# Patient Record
Sex: Female | Born: 1950 | Race: White | Hispanic: No | Marital: Single | Smoking: Never smoker
Health system: Southern US, Community
[De-identification: ages and names within clinical notes are randomized; demographics above are authoritative.]

## PROBLEM LIST (undated history)

## (undated) DIAGNOSIS — N183 Chronic kidney disease, stage 3 unspecified: Secondary | ICD-10-CM

## (undated) DIAGNOSIS — I10 Essential (primary) hypertension: Secondary | ICD-10-CM

## (undated) DIAGNOSIS — F319 Bipolar disorder, unspecified: Secondary | ICD-10-CM

## (undated) DIAGNOSIS — M1990 Unspecified osteoarthritis, unspecified site: Secondary | ICD-10-CM

## (undated) DIAGNOSIS — R931 Abnormal findings on diagnostic imaging of heart and coronary circulation: Secondary | ICD-10-CM

## (undated) DIAGNOSIS — E785 Hyperlipidemia, unspecified: Secondary | ICD-10-CM

## (undated) DIAGNOSIS — E860 Dehydration: Secondary | ICD-10-CM

## (undated) DIAGNOSIS — H269 Unspecified cataract: Secondary | ICD-10-CM

## (undated) DIAGNOSIS — K219 Gastro-esophageal reflux disease without esophagitis: Secondary | ICD-10-CM

## (undated) DIAGNOSIS — I700 Atherosclerosis of aorta: Secondary | ICD-10-CM

## (undated) DIAGNOSIS — G4700 Insomnia, unspecified: Secondary | ICD-10-CM

## (undated) DIAGNOSIS — F329 Major depressive disorder, single episode, unspecified: Secondary | ICD-10-CM

## (undated) DIAGNOSIS — F99 Mental disorder, not otherwise specified: Secondary | ICD-10-CM

## (undated) DIAGNOSIS — F419 Anxiety disorder, unspecified: Secondary | ICD-10-CM

## (undated) DIAGNOSIS — F32A Depression, unspecified: Secondary | ICD-10-CM

## (undated) DIAGNOSIS — T56891A Toxic effect of other metals, accidental (unintentional), initial encounter: Secondary | ICD-10-CM

## (undated) DIAGNOSIS — R197 Diarrhea, unspecified: Secondary | ICD-10-CM

## (undated) DIAGNOSIS — D649 Anemia, unspecified: Secondary | ICD-10-CM

## (undated) DIAGNOSIS — T7840XA Allergy, unspecified, initial encounter: Secondary | ICD-10-CM

## (undated) HISTORY — PX: TOTAL ABDOMINAL HYSTERECTOMY: SHX209

## (undated) HISTORY — DX: Mental disorder, not otherwise specified: F99

## (undated) HISTORY — DX: Unspecified osteoarthritis, unspecified site: M19.90

## (undated) HISTORY — DX: Essential (primary) hypertension: I10

## (undated) HISTORY — DX: Depression, unspecified: F32.A

## (undated) HISTORY — DX: Hyperlipidemia, unspecified: E78.5

## (undated) HISTORY — DX: Anemia, unspecified: D64.9

## (undated) HISTORY — DX: Atherosclerosis of aorta: I70.0

## (undated) HISTORY — DX: Hypercalcemia: E83.52

## (undated) HISTORY — DX: Bipolar disorder, unspecified: F31.9

## (undated) HISTORY — PX: CATARACT EXTRACTION, BILATERAL: SHX1313

## (undated) HISTORY — DX: Major depressive disorder, single episode, unspecified: F32.9

## (undated) HISTORY — DX: Chronic kidney disease, stage 3 unspecified: N18.30

## (undated) HISTORY — PX: CHOLECYSTECTOMY: SHX55

## (undated) HISTORY — DX: Anxiety disorder, unspecified: F41.9

## (undated) HISTORY — DX: Allergy, unspecified, initial encounter: T78.40XA

## (undated) HISTORY — DX: Unspecified cataract: H26.9

## (undated) HISTORY — PX: TONSILLECTOMY: SUR1361

## (undated) HISTORY — DX: Abnormal findings on diagnostic imaging of heart and coronary circulation: R93.1

## (undated) HISTORY — DX: Diarrhea, unspecified: R19.7

## (undated) HISTORY — DX: Gastro-esophageal reflux disease without esophagitis: K21.9

## (undated) HISTORY — DX: Dehydration: E86.0

## (undated) HISTORY — DX: Toxic effect of other metals, accidental (unintentional), initial encounter: T56.891A

## (undated) HISTORY — DX: Chronic kidney disease, stage 3 (moderate): N18.3

## (undated) SURGERY — EGD (ESOPHAGOGASTRODUODENOSCOPY)
Anesthesia: Moderate Sedation

---

## 2000-08-01 ENCOUNTER — Encounter: Admission: RE | Admit: 2000-08-01 | Discharge: 2000-08-01 | Payer: Self-pay | Admitting: Obstetrics and Gynecology

## 2000-08-01 ENCOUNTER — Encounter: Payer: Self-pay | Admitting: Obstetrics and Gynecology

## 2001-04-10 ENCOUNTER — Other Ambulatory Visit: Admission: RE | Admit: 2001-04-10 | Discharge: 2001-04-10 | Payer: Self-pay | Admitting: Obstetrics and Gynecology

## 2001-04-25 ENCOUNTER — Ambulatory Visit (HOSPITAL_COMMUNITY): Admission: RE | Admit: 2001-04-25 | Discharge: 2001-04-25 | Payer: Self-pay | Admitting: Pulmonary Disease

## 2001-08-07 ENCOUNTER — Encounter: Admission: RE | Admit: 2001-08-07 | Discharge: 2001-08-07 | Payer: Self-pay | Admitting: Obstetrics and Gynecology

## 2001-08-07 ENCOUNTER — Encounter: Payer: Self-pay | Admitting: Obstetrics and Gynecology

## 2001-08-14 ENCOUNTER — Encounter: Payer: Self-pay | Admitting: Obstetrics and Gynecology

## 2001-08-14 ENCOUNTER — Encounter: Admission: RE | Admit: 2001-08-14 | Discharge: 2001-08-14 | Payer: Self-pay | Admitting: Obstetrics and Gynecology

## 2002-04-27 ENCOUNTER — Emergency Department (HOSPITAL_COMMUNITY): Admission: EM | Admit: 2002-04-27 | Discharge: 2002-04-27 | Payer: Self-pay | Admitting: Emergency Medicine

## 2002-05-08 ENCOUNTER — Ambulatory Visit (HOSPITAL_COMMUNITY): Admission: RE | Admit: 2002-05-08 | Discharge: 2002-05-08 | Payer: Self-pay | Admitting: Pulmonary Disease

## 2002-05-13 ENCOUNTER — Ambulatory Visit (HOSPITAL_COMMUNITY): Admission: RE | Admit: 2002-05-13 | Discharge: 2002-05-13 | Payer: Self-pay | Admitting: Pulmonary Disease

## 2002-06-04 ENCOUNTER — Encounter: Payer: Self-pay | Admitting: General Surgery

## 2002-06-12 ENCOUNTER — Observation Stay (HOSPITAL_COMMUNITY): Admission: RE | Admit: 2002-06-12 | Discharge: 2002-06-13 | Payer: Self-pay | Admitting: General Surgery

## 2002-06-12 ENCOUNTER — Encounter (INDEPENDENT_AMBULATORY_CARE_PROVIDER_SITE_OTHER): Payer: Self-pay | Admitting: Specialist

## 2002-10-14 ENCOUNTER — Encounter: Payer: Self-pay | Admitting: Obstetrics and Gynecology

## 2002-10-14 ENCOUNTER — Encounter: Admission: RE | Admit: 2002-10-14 | Discharge: 2002-10-14 | Payer: Self-pay | Admitting: Obstetrics and Gynecology

## 2002-12-22 ENCOUNTER — Other Ambulatory Visit: Admission: RE | Admit: 2002-12-22 | Discharge: 2002-12-22 | Payer: Self-pay | Admitting: Obstetrics and Gynecology

## 2003-10-30 ENCOUNTER — Encounter: Admission: RE | Admit: 2003-10-30 | Discharge: 2003-10-30 | Payer: Self-pay | Admitting: Obstetrics and Gynecology

## 2003-11-06 ENCOUNTER — Ambulatory Visit (HOSPITAL_COMMUNITY): Admission: RE | Admit: 2003-11-06 | Discharge: 2003-11-06 | Payer: Self-pay | Admitting: Pulmonary Disease

## 2004-11-14 ENCOUNTER — Encounter: Admission: RE | Admit: 2004-11-14 | Discharge: 2004-11-14 | Payer: Self-pay | Admitting: Obstetrics and Gynecology

## 2005-06-24 ENCOUNTER — Ambulatory Visit (HOSPITAL_COMMUNITY): Admission: RE | Admit: 2005-06-24 | Discharge: 2005-06-24 | Payer: Self-pay | Admitting: Pulmonary Disease

## 2005-11-16 ENCOUNTER — Encounter: Admission: RE | Admit: 2005-11-16 | Discharge: 2005-11-16 | Payer: Self-pay | Admitting: Obstetrics and Gynecology

## 2006-02-09 ENCOUNTER — Ambulatory Visit (HOSPITAL_COMMUNITY): Admission: RE | Admit: 2006-02-09 | Discharge: 2006-02-09 | Payer: Self-pay | Admitting: Pulmonary Disease

## 2006-11-29 ENCOUNTER — Encounter: Admission: RE | Admit: 2006-11-29 | Discharge: 2006-11-29 | Payer: Self-pay | Admitting: Obstetrics and Gynecology

## 2007-12-04 ENCOUNTER — Encounter: Admission: RE | Admit: 2007-12-04 | Discharge: 2007-12-04 | Payer: Self-pay | Admitting: Obstetrics and Gynecology

## 2008-03-18 ENCOUNTER — Ambulatory Visit (HOSPITAL_COMMUNITY): Admission: RE | Admit: 2008-03-18 | Discharge: 2008-03-18 | Payer: Self-pay | Admitting: Pulmonary Disease

## 2008-12-07 ENCOUNTER — Encounter: Admission: RE | Admit: 2008-12-07 | Discharge: 2008-12-07 | Payer: Self-pay | Admitting: Obstetrics and Gynecology

## 2009-12-08 ENCOUNTER — Encounter: Admission: RE | Admit: 2009-12-08 | Discharge: 2009-12-08 | Payer: Self-pay | Admitting: Obstetrics and Gynecology

## 2010-12-12 ENCOUNTER — Ambulatory Visit (HOSPITAL_COMMUNITY)
Admission: RE | Admit: 2010-12-12 | Discharge: 2010-12-12 | Payer: Self-pay | Source: Home / Self Care | Attending: Obstetrics and Gynecology | Admitting: Obstetrics and Gynecology

## 2011-04-21 NOTE — Op Note (Signed)
[facility]  Patient:    Samantha Conley, Samantha Conley Visit Number: [id number] MRN: [id number]          Service Type: SUR Location: 3W [id number] Attending Physician:  Luella Cook Dictated by:   Sammuel Hines. Daiva Nakayama, M.D. Proc. Date: 06/12/02 Admit Date:  06/12/2002 Discharge Date: 06/13/2002                             Operative Report  PREOPERATIVE DIAGNOSIS:  Biliary dyskinesia.  POSTOPERATIVE DIAGNOSIS:  Biliary dyskinesia.  PROCEDURE:  Laparoscopic cholecystectomy.  SURGEON:  Sammuel Hines. Daiva Nakayama, M.D.  ASSISTANT:  Haywood Lasso, M.D.  ANESTHESIA:  General endotracheal.  PROCEDURE:  After informed consent was obtained, the patient was brought to the operating room, placed in a supine position on the operating table. After adequate induction of general anesthesia, the patients abdomen was prepped with Betadine and draped in usual sterile manner. The area below the umbilicus was infiltrated with 0.25% Marcaine and a small vertically oriented incision was made with a 15 blade knife. This incision was carried down through the subcutaneous tissue using blunt dissection with Army-Nav[employer] retractors and a Kelly clamp until the linea alba was identified. The linea alba was incised with a 15 blade knife and each side was grasped with Kocher clamps and elevated anteriorly. The preperitoneal space was then prepped only with a hemostat until the peritoneum was opened and access was obtained of the abdominal cavity. A finger was inserted through this opening and there were no apparent adhesions to the anterior abdominal wall. A 0 Vicryl pursestring stitch was placed in the fascia surrounding this opening. A Hasson cannula was then placed through this opening and anchored in place with the previous placed Vicryl pursestring stitch. The abdomen was then insufflated with carbon dioxide without difficulty. The patient was placed in the head-up position and a laparoscope  was placed through the Hasson cannula and the dome of the gallbladder and liver edge were readily identified. A small transverse upper midline incision was then made after infiltrating this area with 0.25% Marcaine. A 10-mm port was then placed bluntly through this incision into the abdominal cavity under direct vision. Sites were then chosen laterally on the right side of the abdomen for placement of 5-mm ports and each of these areas was infiltrated with 0.25% Marcaine. Small stab incisions were made with a 15 blade knife and 5-mm ports were placed bluntly through these incisions into the abdominal cavity under direct vision. A blunt grasper was placed through the lateral most 5-mm port and used to grasp the dome of the gallbladder and elevate it anteriorly and superiorly. Another blunt grasper was placed through the other 5-mm port and used to retract on the body and neck of the gallbladder. A dissector was placed through the upper midline port and using the electrocautery, the peritoneal reflection of the gallbladder neck was opened. The blunt dissection was then carried out in this area until the gallbladder neck cystic duct junction was readily identified. Dissection was carried out circumferentially until a nice window was created and care was taken to keep the common duct medial to this dissection. Three clips were then placed proximally and one distally on the cystic duct and the duct was divided between the two. Posterior to this, the cystic artery was identified and again, dissected bluntly in a circumferential manner creating a nice window. Two clips were placed proximally in the artery  and one distally and the artery was divided between the two. Next, a laparoscopic hook cautery device was used to separate the gallbladder from the liver bed. Prior to completely detaching the gallbladder from the liver bed, the liver bed was inspected and several small bleeding points were  coagulated with electrocautery until the liver bed was completely hemostatic. The gallbladder was then detached the rest of the way from the liver bed using the hook electrocautery. The camera was then moved to the upper midline port and a gallbladder grasper was placed through the Hasson cannula and used to grasp the neck of the gallbladder. The gallbladder was then removed with the Hasson cannula through the infraumbilical port and the fascial defect was closed with the previous placed Vicryl pursestring stitch. The abdomen was then irrigated with copious amounts of saline until the effluent was clear and the liver bed was inspected again and found to be hemostatic. The ports were then all removed under direct vision and were hemostatic and the gas was allowed to escape. The skin incision was then closed with interrupted 4-0 Monocryl subcuticular stitches. Benzoin and Steri-Strips were applied. The patient tolerated the procedure well. At the end of the case, all sponge, instrument, and needle counts were correct. The patient was awakened and taken to the recovery room in stable condition. Dictated by:   Sammuel Hines. Daiva Nakayama, M.D. Attending Physician:  Luella Cook DD:  06/25/02 TD:  06/30/02 Job: [id number]

## 2011-11-07 ENCOUNTER — Other Ambulatory Visit: Payer: Self-pay | Admitting: Obstetrics and Gynecology

## 2011-11-07 DIAGNOSIS — Z1231 Encounter for screening mammogram for malignant neoplasm of breast: Secondary | ICD-10-CM

## 2011-12-01 ENCOUNTER — Encounter (INDEPENDENT_AMBULATORY_CARE_PROVIDER_SITE_OTHER): Payer: Self-pay | Admitting: *Deleted

## 2011-12-14 ENCOUNTER — Encounter (INDEPENDENT_AMBULATORY_CARE_PROVIDER_SITE_OTHER): Payer: Self-pay | Admitting: Internal Medicine

## 2011-12-14 ENCOUNTER — Ambulatory Visit (INDEPENDENT_AMBULATORY_CARE_PROVIDER_SITE_OTHER): Payer: 59 | Admitting: Internal Medicine

## 2011-12-14 DIAGNOSIS — F99 Mental disorder, not otherwise specified: Secondary | ICD-10-CM | POA: Insufficient documentation

## 2011-12-14 DIAGNOSIS — I10 Essential (primary) hypertension: Secondary | ICD-10-CM | POA: Insufficient documentation

## 2011-12-14 DIAGNOSIS — J40 Bronchitis, not specified as acute or chronic: Secondary | ICD-10-CM | POA: Insufficient documentation

## 2011-12-14 DIAGNOSIS — K5900 Constipation, unspecified: Secondary | ICD-10-CM

## 2011-12-14 DIAGNOSIS — M109 Gout, unspecified: Secondary | ICD-10-CM | POA: Insufficient documentation

## 2011-12-14 DIAGNOSIS — F319 Bipolar disorder, unspecified: Secondary | ICD-10-CM

## 2011-12-14 HISTORY — DX: Mental disorder, not otherwise specified: F99

## 2011-12-14 NOTE — Patient Instructions (Addendum)
Amitiza  Daily.Samantha Conley schedule a colonoscopy .

## 2011-12-14 NOTE — Progress Notes (Signed)
Subjective:     Patient ID: Samantha Conley, female   DOB: 06-Mar-1951, 61 y.o.   MRN: [id number]  HPI  Samantha Conley is a 61 yr old female referred to our office by Dr. Luan Pulling because of constipation. She says she has been having problems with constipation since 2011.  She was taking a stool softner but it caused her to have frequent stools.  In December she had to give herself 3 enemas due to constipation.  Since making the appt x 2 weeks with our office, she has BM every day.  She tells me she has lost about 10 since August.  Appetite is good. No melena or bright red rectal bleeding.  She has never undergone a colonoscopy in the past.    Review of Systems See hpi  Current Outpatient Prescriptions  Medication Sig Dispense Refill  . allopurinol (ZYLOPRIM) 300 MG tablet Take 300 mg by mouth daily.      Marland Kitchen aspirin 81 MG tablet Take 160 mg by mouth daily.      . diphenhydrAMINE (SOMINEX) 25 MG tablet Take 25 mg by mouth at bedtime as needed.      . fexofenadine (ALLEGRA) 180 MG tablet Take 180 mg by mouth daily.      Marland Kitchen lithium carbonate (ESKALITH) 450 MG CR tablet Take 450 mg by mouth at bedtime.      Marland Kitchen thiothixene (NAVANE) 2 MG capsule Take 2 mg by mouth as needed.       History   Social History  . Marital Status: Single    Spouse Name: N/A    Number of Children: N/A  . Years of Education: N/A   Occupational History  . Not on file.   Social History Main Topics  . Smoking status: Never Smoker   . Smokeless tobacco: Not on file  . Alcohol Use: No  . Drug Use: No  . Sexually Active: Not on file   Other Topics Concern  . Not on file   Social History Narrative  . No narrative on file   Past Medical History  Diagnosis Date  . Hypertension     since 2007  . Gout    Past Surgical History  Procedure Date  . Chemical imbalance   . Cholecystectomy   . Total abdominal hysterectomy   . Tonsillectomy    History reviewed. No pertinent family history. Family Status  Relation Status  Death Age  . Mother Deceased     CVA  . Father Deceased     parkinson  . Brother Alive     unknown   Allergies  Allergen Reactions  . Codeine   . Penicillins        Objective:   Physical Exam  Filed Vitals:   12/14/11 1515  Height: 5\' 5"  (1.651 m)  Weight: 176 lb (79.833 kg)    Alert and oriented. Skin warm and dry. Oral mucosa is moist.   . Sclera anicteric, conjunctivae is pink. Thyroid not enlarged. No cervical lymphadenopathy. Lungs clear. Heart regular rate and rhythm.  Abdomen is soft. Bowel sounds are positive. No hepatomegaly. No abdominal masses felt. No tenderness.  No edema to lower extremities. Patient is alert and oriented.     Assessment:    Constipation. In need of screening colonoscopy    Plan:    Colonoscopy will be scheduled.   The risks and benefits such as perforation, bleeding, and infection were reviewed with the patient and is agreeable. Amitiza samples given to patient.

## 2011-12-25 ENCOUNTER — Ambulatory Visit
Admission: RE | Admit: 2011-12-25 | Discharge: 2011-12-25 | Disposition: A | Discharge status: Home / Self Care | Payer: 59 | Source: Ambulatory Visit | Attending: Obstetrics and Gynecology | Admitting: Obstetrics and Gynecology

## 2011-12-25 DIAGNOSIS — Z1231 Encounter for screening mammogram for malignant neoplasm of breast: Secondary | ICD-10-CM

## 2012-05-27 ENCOUNTER — Other Ambulatory Visit: Payer: Self-pay | Admitting: Gastroenterology

## 2012-12-02 ENCOUNTER — Other Ambulatory Visit (HOSPITAL_COMMUNITY): Payer: Self-pay | Admitting: Pulmonary Disease

## 2012-12-02 DIAGNOSIS — Z1231 Encounter for screening mammogram for malignant neoplasm of breast: Secondary | ICD-10-CM

## 2012-12-04 HISTORY — PX: NASAL SINUS SURGERY: SHX719

## 2012-12-25 ENCOUNTER — Ambulatory Visit (HOSPITAL_COMMUNITY)
Admission: RE | Admit: 2012-12-25 | Discharge: 2012-12-25 | Disposition: A | Discharge status: Home / Self Care | Payer: Self-pay | Source: Ambulatory Visit | Attending: Pulmonary Disease | Admitting: Pulmonary Disease

## 2012-12-25 DIAGNOSIS — Z1231 Encounter for screening mammogram for malignant neoplasm of breast: Secondary | ICD-10-CM

## 2013-11-26 ENCOUNTER — Other Ambulatory Visit (HOSPITAL_COMMUNITY): Payer: Self-pay | Admitting: Obstetrics and Gynecology

## 2013-11-26 DIAGNOSIS — Z1231 Encounter for screening mammogram for malignant neoplasm of breast: Secondary | ICD-10-CM

## 2013-12-22 ENCOUNTER — Other Ambulatory Visit (HOSPITAL_COMMUNITY): Payer: Self-pay | Admitting: Pulmonary Disease

## 2013-12-22 ENCOUNTER — Ambulatory Visit (HOSPITAL_COMMUNITY)
Admission: RE | Admit: 2013-12-22 | Discharge: 2013-12-22 | Disposition: A | Discharge status: Home / Self Care | Payer: 59 | Source: Ambulatory Visit | Attending: Pulmonary Disease | Admitting: Pulmonary Disease

## 2013-12-22 DIAGNOSIS — R071 Chest pain on breathing: Secondary | ICD-10-CM | POA: Insufficient documentation

## 2013-12-22 DIAGNOSIS — R05 Cough: Secondary | ICD-10-CM | POA: Insufficient documentation

## 2013-12-22 DIAGNOSIS — R0989 Other specified symptoms and signs involving the circulatory and respiratory systems: Secondary | ICD-10-CM | POA: Insufficient documentation

## 2013-12-22 DIAGNOSIS — R0781 Pleurodynia: Secondary | ICD-10-CM

## 2013-12-22 DIAGNOSIS — R059 Cough, unspecified: Secondary | ICD-10-CM | POA: Insufficient documentation

## 2013-12-26 ENCOUNTER — Ambulatory Visit (HOSPITAL_COMMUNITY)
Admission: RE | Admit: 2013-12-26 | Discharge: 2013-12-26 | Disposition: A | Discharge status: Home / Self Care | Payer: 59 | Source: Ambulatory Visit | Attending: Obstetrics and Gynecology | Admitting: Obstetrics and Gynecology

## 2013-12-26 DIAGNOSIS — Z1231 Encounter for screening mammogram for malignant neoplasm of breast: Secondary | ICD-10-CM

## 2014-03-12 ENCOUNTER — Inpatient Hospital Stay (HOSPITAL_COMMUNITY): Payer: 59

## 2014-03-12 ENCOUNTER — Encounter (HOSPITAL_COMMUNITY): Payer: Self-pay | Admitting: Emergency Medicine

## 2014-03-12 ENCOUNTER — Inpatient Hospital Stay (HOSPITAL_COMMUNITY)
Admission: EM | Admit: 2014-03-12 | Discharge: 2014-03-15 | DRG: 683 | Disposition: A | Discharge status: Home / Self Care | Payer: 59 | Attending: Pulmonary Disease | Admitting: Pulmonary Disease

## 2014-03-12 DIAGNOSIS — Z8249 Family history of ischemic heart disease and other diseases of the circulatory system: Secondary | ICD-10-CM

## 2014-03-12 DIAGNOSIS — J45909 Unspecified asthma, uncomplicated: Secondary | ICD-10-CM | POA: Diagnosis present

## 2014-03-12 DIAGNOSIS — Z823 Family history of stroke: Secondary | ICD-10-CM

## 2014-03-12 DIAGNOSIS — E872 Acidosis, unspecified: Secondary | ICD-10-CM

## 2014-03-12 DIAGNOSIS — R112 Nausea with vomiting, unspecified: Secondary | ICD-10-CM

## 2014-03-12 DIAGNOSIS — E860 Dehydration: Secondary | ICD-10-CM | POA: Diagnosis present

## 2014-03-12 DIAGNOSIS — R197 Diarrhea, unspecified: Secondary | ICD-10-CM

## 2014-03-12 DIAGNOSIS — D649 Anemia, unspecified: Secondary | ICD-10-CM | POA: Diagnosis present

## 2014-03-12 DIAGNOSIS — A088 Other specified intestinal infections: Secondary | ICD-10-CM | POA: Diagnosis present

## 2014-03-12 DIAGNOSIS — M109 Gout, unspecified: Secondary | ICD-10-CM | POA: Diagnosis present

## 2014-03-12 DIAGNOSIS — F319 Bipolar disorder, unspecified: Secondary | ICD-10-CM | POA: Diagnosis present

## 2014-03-12 DIAGNOSIS — I10 Essential (primary) hypertension: Secondary | ICD-10-CM | POA: Diagnosis present

## 2014-03-12 DIAGNOSIS — N179 Acute kidney failure, unspecified: Principal | ICD-10-CM

## 2014-03-12 HISTORY — DX: Dehydration: E86.0

## 2014-03-12 HISTORY — DX: Insomnia, unspecified: G47.00

## 2014-03-12 HISTORY — DX: Bipolar disorder, unspecified: F31.9

## 2014-03-12 HISTORY — DX: Hypercalcemia: E83.52

## 2014-03-12 HISTORY — DX: Diarrhea, unspecified: R19.7

## 2014-03-12 LAB — URINALYSIS, ROUTINE W REFLEX MICROSCOPIC
BILIRUBIN URINE: NEGATIVE
Glucose, UA: NEGATIVE mg/dL
HGB URINE DIPSTICK: NEGATIVE
Ketones, ur: NEGATIVE mg/dL
Leukocytes, UA: NEGATIVE
Nitrite: NEGATIVE
Protein, ur: NEGATIVE mg/dL
Specific Gravity, Urine: 1.02 (ref 1.005–1.030)
UROBILINOGEN UA: 0.2 mg/dL (ref 0.0–1.0)
pH: 5.5 (ref 5.0–8.0)

## 2014-03-12 LAB — CBC WITH DIFFERENTIAL/PLATELET
BASOS PCT: 1 % (ref 0–1)
Basophils Absolute: 0.1 10*3/uL (ref 0.0–0.1)
EOS ABS: 0.1 10*3/uL (ref 0.0–0.7)
Eosinophils Relative: 1 % (ref 0–5)
HCT: 32.4 % — ABNORMAL LOW (ref 36.0–46.0)
Hemoglobin: 10.5 g/dL — ABNORMAL LOW (ref 12.0–15.0)
LYMPHS ABS: 1.5 10*3/uL (ref 0.7–4.0)
Lymphocytes Relative: 15 % (ref 12–46)
MCH: 29.6 pg (ref 26.0–34.0)
MCHC: 32.4 g/dL (ref 30.0–36.0)
MCV: 91.3 fL (ref 78.0–100.0)
Monocytes Absolute: 0.4 10*3/uL (ref 0.1–1.0)
Monocytes Relative: 4 % (ref 3–12)
Neutro Abs: 8.3 10*3/uL — ABNORMAL HIGH (ref 1.7–7.7)
Neutrophils Relative %: 80 % — ABNORMAL HIGH (ref 43–77)
PLATELETS: 296 10*3/uL (ref 150–400)
RBC: 3.55 MIL/uL — AB (ref 3.87–5.11)
RDW: 13.8 % (ref 11.5–15.5)
WBC: 10.4 10*3/uL (ref 4.0–10.5)

## 2014-03-12 LAB — HEPATIC FUNCTION PANEL
ALK PHOS: 114 U/L (ref 39–117)
ALT: 11 U/L (ref 0–35)
AST: 14 U/L (ref 0–37)
Albumin: 4.2 g/dL (ref 3.5–5.2)
BILIRUBIN TOTAL: 0.8 mg/dL (ref 0.3–1.2)
Total Protein: 7.4 g/dL (ref 6.0–8.3)

## 2014-03-12 LAB — BASIC METABOLIC PANEL
BUN: 61 mg/dL — ABNORMAL HIGH (ref 6–23)
CO2: 15 mEq/L — ABNORMAL LOW (ref 19–32)
Calcium: 11.6 mg/dL — ABNORMAL HIGH (ref 8.4–10.5)
Chloride: 107 mEq/L (ref 96–112)
Creatinine, Ser: 4.49 mg/dL — ABNORMAL HIGH (ref 0.50–1.10)
GFR calc Af Amer: 11 mL/min — ABNORMAL LOW (ref >90)
GFR calc non Af Amer: 10 mL/min — ABNORMAL LOW (ref >90)
GLUCOSE: 140 mg/dL — AB (ref 70–99)
Potassium: 5 mEq/L (ref 3.7–5.3)
SODIUM: 134 meq/L — AB (ref 137–147)

## 2014-03-12 LAB — CREATININE, URINE, RANDOM: Creatinine, Urine: 162.1 mg/dL

## 2014-03-12 LAB — LITHIUM LEVEL: Lithium Lvl: 2.23 mEq/L (ref 0.80–1.40)

## 2014-03-12 LAB — SODIUM, URINE, RANDOM: Sodium, Ur: 38 mEq/L

## 2014-03-12 LAB — LIPASE, BLOOD: LIPASE: 477 U/L — AB (ref 11–59)

## 2014-03-12 LAB — LACTIC ACID, PLASMA: LACTIC ACID, VENOUS: 1.1 mmol/L (ref 0.5–2.2)

## 2014-03-12 MED ORDER — ALBUTEROL SULFATE HFA 108 (90 BASE) MCG/ACT IN AERS: 2.0000 | INHALATION_SPRAY | Freq: Two times a day (BID) | RESPIRATORY_TRACT | Status: DC

## 2014-03-12 MED ORDER — ALBUTEROL SULFATE (2.5 MG/3ML) 0.083% IN NEBU
2.5000 mg | INHALATION_SOLUTION | Freq: Two times a day (BID) | RESPIRATORY_TRACT | Status: DC
  Administered 2014-03-13: 2.5 mg via RESPIRATORY_TRACT
  Filled 2014-03-12 (×4): qty 3

## 2014-03-12 MED ORDER — HEPARIN SODIUM (PORCINE) 5000 UNIT/ML IJ SOLN
5000.0000 [IU] | Freq: Three times a day (TID) | INTRAMUSCULAR | Status: DC
  Administered 2014-03-12 – 2014-03-15 (×7): 5000 [IU] via SUBCUTANEOUS
  Filled 2014-03-12 (×7): qty 1

## 2014-03-12 MED ORDER — ONDANSETRON HCL 4 MG/2ML IJ SOLN
4.0000 mg | Freq: Four times a day (QID) | INTRAMUSCULAR | Status: DC | PRN
  Administered 2014-03-12 – 2014-03-13 (×2): 4 mg via INTRAVENOUS
  Filled 2014-03-12 (×2): qty 2

## 2014-03-12 MED ORDER — SODIUM CHLORIDE 0.9 % IV BOLUS (SEPSIS)
1000.0000 mL | Freq: Once | INTRAVENOUS | Status: AC
  Administered 2014-03-12: 1000 mL via INTRAVENOUS

## 2014-03-12 MED ORDER — DIPHENHYDRAMINE HCL (SLEEP) 25 MG PO TABS: 25.0000 mg | ORAL_TABLET | Freq: Every evening | ORAL | Status: DC | PRN

## 2014-03-12 MED ORDER — ALBUTEROL SULFATE (2.5 MG/3ML) 0.083% IN NEBU: 2.5000 mg | INHALATION_SOLUTION | Freq: Two times a day (BID) | RESPIRATORY_TRACT | Status: DC

## 2014-03-12 MED ORDER — ALLOPURINOL 300 MG PO TABS
300.0000 mg | ORAL_TABLET | Freq: Every day | ORAL | Status: DC
  Administered 2014-03-12: 300 mg via ORAL
  Filled 2014-03-12 (×2): qty 1

## 2014-03-12 MED ORDER — ONDANSETRON HCL 4 MG PO TABS: 4.0000 mg | ORAL_TABLET | Freq: Four times a day (QID) | ORAL | Status: DC | PRN

## 2014-03-12 MED ORDER — ACETAMINOPHEN 500 MG PO TABS
500.0000 mg | ORAL_TABLET | Freq: Four times a day (QID) | ORAL | Status: DC | PRN
  Administered 2014-03-12 – 2014-03-13 (×3): 500 mg via ORAL
  Filled 2014-03-12 (×3): qty 1

## 2014-03-12 MED ORDER — THIOTHIXENE 2 MG PO CAPS
2.0000 mg | ORAL_CAPSULE | Freq: Every day | ORAL | Status: DC
  Administered 2014-03-12: 2 mg via ORAL
  Filled 2014-03-12 (×5): qty 1

## 2014-03-12 MED ORDER — ONDANSETRON HCL 4 MG/2ML IJ SOLN: 4.0000 mg | Freq: Three times a day (TID) | INTRAMUSCULAR | Status: DC | PRN

## 2014-03-12 MED ORDER — DIPHENHYDRAMINE HCL 25 MG PO CAPS: 25.0000 mg | ORAL_CAPSULE | Freq: Every evening | ORAL | Status: DC | PRN

## 2014-03-12 MED ORDER — TEMAZEPAM 15 MG PO CAPS
30.0000 mg | ORAL_CAPSULE | Freq: Every evening | ORAL | Status: DC | PRN
Start: 2014-03-12 — End: 2014-03-15
  Administered 2014-03-12 – 2014-03-14 (×3): 30 mg via ORAL
  Filled 2014-03-12 (×4): qty 2

## 2014-03-12 MED ORDER — SODIUM CHLORIDE 0.9 % IV SOLN
INTRAVENOUS | Status: DC
  Administered 2014-03-12 – 2014-03-15 (×6): via INTRAVENOUS

## 2014-03-12 MED ORDER — ALBUTEROL SULFATE (2.5 MG/3ML) 0.083% IN NEBU
INHALATION_SOLUTION | RESPIRATORY_TRACT | Status: AC
  Filled 2014-03-12: qty 3

## 2014-03-12 NOTE — H&P (Addendum)
[facility] History and Physical  Samantha Conley [id number] DOB: [birth year] DOA: 03/12/2014  Referring physician:  Rolland Porter PCP:  Alonza Bogus, MD   Chief Complaint:  Nausea, vomiting, diarrhea  HPI:  The patient is a 63 y.o. year-old female with history of HTN, asthma, gout, insomnia and possible bipolar disorder who presents with nausea, vomiting, and diarrhea.  The patient was last at their baseline health 5 days ago.  She states she developed nausea with nonbilious, nonbloody emesis approximately 3-4 times a day with intermittent heaves.  She has had watery, nonbloody diarrhea 4-5 times per day. She has not been able to keep down sips of water, soda, or other fluids. She felt she was becoming dehydrated. This morning, she developed 8/10 right upper quadrant pain which lasted for approximately 1 hour and then resolved on its own.  She denies fevers, chills. She has had some cough productive of clear phlegm. Denies dysuria.  In the emergency department, white blood cell count 10.4, hemoglobin 10.5, sodium 134, CO2 15, BUN 61, creatinine 4.49, calcium 11.6, glucose 140. Lipase 477. Vital signs within normal limits. Lithium level 2.23. Urinalysis negative.  She is being admitted for dehydration, AKI.    Review of Systems:  General:  Denies fevers, chills, weight loss or gain HEENT:  Denies changes to hearing and vision, had sinus surgery two weeks ago CV:  Denies chest pain and palpitations, lower extremity edema.  PULM:  +  SOB with cough.   GI:  Per history of present illness  ENDO:  Denies polyuria, polydipsia.   HEME:  Denies hematemesis, blood in stools, melena, abnormal bruising or bleeding.  LYMPH:  Denies lymphadenopathy.   MSK:  Denies arthralgias, myalgias.   DERM:  Denies skin rash or ulcer.   NEURO:  Denies focal numbness, weakness, slurred speech, confusion, facial droop.  PSYCH:  Denies anxiety and depression.    Past Medical History  Diagnosis Date  .  Hypertension     since 2007  . Gout   . Asthma   . Bipolar disorder     patient denies  . Insomnia    Past Surgical History  Procedure Laterality Date  . Chemical imbalance    . Cholecystectomy    . Total abdominal hysterectomy    . Tonsillectomy    . Nasal sinus surgery     Social History:  reports that she has never smoked. She does not have any smokeless tobacco history on file. She reports that she does not drink alcohol or use illicit drugs. Lives alone with two pets, works at school.    Allergies  Allergen Reactions  . Codeine Other (See Comments)    Makes states it makes her "spacey"  . Doxycycline     Severe diarrhea  . Penicillins Other (See Comments)    unknown  . Promethazine Other (See Comments)    Patient states she is not allergic but it makes her  "spacey and not in control"    Family History  Problem Relation Age of Onset  . CAD Mother   . CAD Father   . CVA Mother   . Parkinsonism Father      Prior to Admission medications   Medication Sig Start Date End Date Taking? Authorizing Provider  acetaminophen (TYLENOL) 500 MG tablet Take 500 mg by mouth every 6 (six) hours as needed for mild pain.   Yes Historical Provider, MD  albuterol (PROVENTIL HFA;VENTOLIN HFA) 108 (90 BASE) MCG/ACT inhaler Inhale 2 puffs into  the lungs 2 (two) times daily.   Yes Historical Provider, MD  allopurinol (ZYLOPRIM) 300 MG tablet Take 300 mg by mouth daily.   Yes Historical Provider, MD  amLODipine (NORVASC) 5 MG tablet Take 5 mg by mouth daily.   Yes Historical Provider, MD  diphenhydrAMINE (SOMINEX) 25 MG tablet Take 25 mg by mouth at bedtime as needed for sleep.    Yes Historical Provider, MD  lisinopril (PRINIVIL,ZESTRIL) 10 MG tablet Take 10 mg by mouth daily.   Yes Historical Provider, MD  lithium carbonate (ESKALITH) 450 MG CR tablet Take 450 mg by mouth at bedtime.   Yes Historical Provider, MD  temazepam (RESTORIL) 30 MG capsule Take 30 mg by mouth at bedtime as  needed for sleep.   Yes Historical Provider, MD  thiothixene (NAVANE) 2 MG capsule Take 2 mg by mouth daily.    Yes Historical Provider, MD   Physical Exam: Filed Vitals:   03/12/14 1013 03/12/14 1227 03/12/14 1300 03/12/14 1400  BP: 106/60 109/57  106/51  Pulse: 96 68 83 75  Temp: 98.2 F (36.8 C) 98.2 F (36.8 C)    TempSrc: Oral Oral    Resp: 20 14 21 18   Height: 5\' 4"  (1.626 m)     Weight: 74.844 kg (165 lb)     SpO2: 97% 99% 96% 97%     General:  Caucasian female, no acute distress  Eyes:  PERRL, anicteric, non-injected. Some denies  ENT:  Nares clear.  OP clear, non-erythematous without plaques or exudates.  Dry lips, and mucous membranes moist after vomiting.  Neck:  Supple without TM or JVD.    Lymph:  No cervical, supraclavicular, or submandibular LAD.  Cardiovascular:  RRR, normal S1, S2, without m/r/g.  2+ pulses, warm extremities  Respiratory:  Progress breath sounds, no focal rales, wheezes, no increased WOB.  Abdomen:  High-pitched BS.  Soft, ND/NT.    Skin:  No rashes or focal lesions.  Musculoskeletal:  Normal bulk and tone.  No LE edema.  Psychiatric:  A & O x 4.  Appropriate affect.  Neurologic:  CN 3-12 intact.  5/5 strength.  Sensation intact.  Labs on Admission:  Basic Metabolic Panel:  Recent Labs Lab 03/12/14 1048  NA 134*  K 5.0  CL 107  CO2 15*  GLUCOSE 140*  BUN 61*  CREATININE 4.49*  CALCIUM 11.6*   Liver Function Tests:  Recent Labs Lab 03/12/14 1048  AST 14  ALT 11  ALKPHOS 114  BILITOT 0.8  PROT 7.4  ALBUMIN 4.2    Recent Labs Lab 03/12/14 1048  LIPASE 477*   No results found for this basename: AMMONIA,  in the last 168 hours CBC:  Recent Labs Lab 03/12/14 1048  WBC 10.4  NEUTROABS 8.3*  HGB 10.5*  HCT 32.4*  MCV 91.3  PLT 296   Cardiac Enzymes: No results found for this basename: CKTOTAL, CKMB, CKMBINDEX, TROPONINI,  in the last 168 hours  BNP (last 3 results) No results found for this  basename: PROBNP,  in the last 8760 hours CBG: No results found for this basename: GLUCAP,  in the last 168 hours  Radiological Exams on Admission: No results found.  EKG: Independently reviewed. Pending  Assessment/Plan Active Problems:   Hypertension   Gout   Dehydration, severe   Nausea and vomiting   Diarrhea   Normocytic anemia   Hypercalcemia   AKI (acute kidney injury)  ---  Nausea, vomiting, and diarrhea.  Lipase elevated which may  reflect pancreatitis, however, she does not have abdominal pain.  More likely she has gastroenteritis.  Her bilious emesis during my exam, however, is concerning for ileus vs. Partial bowel obstruction.  Present bowel sounds are reassuring.  Finally, elevated lithium level may suggest component of lithium toxicitiy, supported by patient's tremor -  Ice chips -  IVF -  zofran prn -  KUB:  No evidence of obstruction or ileus -  Check GI pathogen and Stool culture -  Advance diet slowly as tolerated  AKI may be due simply to dehydration from above.  UA completely negative, no signs of proteinuria/hematuria concerning for intrinsic renal disease -  IVF -  FENa -  Defer renal US for now unless kidney function not improving with IVF -  Minimize nephrotoxins -  Renally dose medications  Non-gap metabolic acidosis likely secondary to diarrhea -  IVF  Hypercalcemia may also be due to dehydration  -  Repeat calcium level in AM -  Further work up if not trending down  Cough with shortness of breath without symptoms of asthma -  CXR:  NAD -  Continue prn albuterol  HTN with low normal BP -  Hold BP medications  Gout, stable.  Continue allopurinol  Insomnia v. Bipolar -  F/u ECG:  No evidence of QTc prolongation, rate wnl -  F/u TSH -  Hold lithium and repeat level in AM -  Continue thiothixene and restoril  Normocytic anemia and except some decrease with IVF -  Iron studies, B12, folate, TSH -  Occult stool   Diet:  Ice  chips Access:  PIV IVF:  yes Proph:  heparin  Code Status: full code Family Communication: patient alone Disposition Plan: Admit to med-surg  Time spent: 60 min [facility] Pager [phone]  If 7PM-7AM, please contact night-coverage [email or web address] Password [email or web address] 03/12/2014, 3:58 PM

## 2014-03-12 NOTE — ED Provider Notes (Signed)
See prior note   Janice Norrie, MD 03/12/14 1510

## 2014-03-12 NOTE — Progress Notes (Signed)
Pt states she only takes nebs before 0430 as they make her nervous. Nebs have been scheduled for 10 and 4 if not before.

## 2014-03-12 NOTE — ED Notes (Signed)
PT c/o n/v/d since Saturday.  REports had some abd pain this morning but denies at this time.

## 2014-03-12 NOTE — ED Provider Notes (Signed)
Patient reports she started having nausea, vomiting and diarrhea 5 days ago. She states the vomiting has been worse than the diarrhea. She reports she has a very dry mouth and she is only urinating once in the morning. She also reports she had her blood pressure medication changed about 3 weeks ago however she did not recall the new medications which she states there were 2. She states she was on azor.  Patient is alert and cooperative she does have a dry tongue. Chest mild discomfort of her abdomen to palpation the  Medical screening examination/treatment/procedure(s) were conducted as a shared visit with non-physician practitioner(s) and myself.  I personally evaluated the patient during the encounter.   EKG Interpretation None       Rolland Porter, MD, Abram Sander   Janice Norrie, MD 03/12/14 1302

## 2014-03-12 NOTE — ED Provider Notes (Signed)
CSN: [id number]     Arrival date & time 03/12/14  1007 History   First MD Initiated Contact with Patient 03/12/14 1011     Chief Complaint  Patient presents with  . Emesis  . Diarrhea     (Consider location/radiation/quality/duration/timing/severity/associated sxs/prior Treatment) HPI Comments: Samantha Conley is a 63 y.o. 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  Her symptoms started with nausea, vomiting and several episodes of diarrhea which worsened the following day, stating she laid in bed most of the day on Sunday when she wasn't having symptoms.  She has had some mild abdominal discomfort which improves with episodes of diarrhea.  She has had no hematemesis or bloody stools and no documented fevers, although has felt warm.  She attempted to go to work 2 days ago as she felt a little bit better, but did not make it through the day without return of symptoms.  She has taken no medicines for her vomiting or diarrhea.  She has increased weakness and feels dehydrated today.  She denies chest pain, shortness of breath, dysuria.    The history is provided by the patient.    Past Medical History  Diagnosis Date  . Hypertension     since 2007  . Gout    Past Surgical History  Procedure Laterality Date  . Chemical imbalance    . Cholecystectomy    . Total abdominal hysterectomy    . Tonsillectomy    . Nasal sinus surgery     No family history on file. History  Substance Use Topics  . Smoking status: Never Smoker   . Smokeless tobacco: Not on file  . Alcohol Use: No   OB History   Grav Para Term Preterm Abortions TAB SAB Ect Mult Living                 Review of Systems  Constitutional: Negative for fever and chills.  HENT: Negative for congestion and sore throat.   Eyes: Negative.   Respiratory: Negative for chest tightness and shortness of breath.    Cardiovascular: Negative for chest pain.  Gastrointestinal: Positive for nausea, vomiting, abdominal pain and diarrhea.  Genitourinary: Negative.   Musculoskeletal: Negative for arthralgias, joint swelling and neck pain.  Skin: Negative.  Negative for rash and wound.  Neurological: Positive for weakness. Negative for dizziness, light-headedness, numbness and headaches.  Psychiatric/Behavioral: Negative.       Allergies  Codeine; Doxycycline; Penicillins; and Promethazine  Home Medications   Current Outpatient Rx  Name  Route  Sig  Dispense  Refill  . acetaminophen (TYLENOL) 500 MG tablet   Oral   Take 500 mg by mouth every 6 (six) hours as needed for mild pain.         Marland Kitchen albuterol (PROVENTIL HFA;VENTOLIN HFA) 108 (90 BASE) MCG/ACT inhaler   Inhalation   Inhale 2 puffs into the lungs 2 (two) times daily.         Marland Kitchen allopurinol (ZYLOPRIM) 300 MG tablet   Oral   Take 300 mg by mouth daily.         Marland Kitchen amLODipine (NORVASC) 5 MG tablet   Oral   Take 5 mg by mouth daily.         . diphenhydrAMINE (SOMINEX) 25 MG tablet   Oral   Take 25 mg by mouth at bedtime as needed  for sleep.          Marland Kitchen lisinopril (PRINIVIL,ZESTRIL) 10 MG tablet   Oral   Take 10 mg by mouth daily.         Marland Kitchen lithium carbonate (ESKALITH) 450 MG CR tablet   Oral   Take 450 mg by mouth at bedtime.         . temazepam (RESTORIL) 30 MG capsule   Oral   Take 30 mg by mouth at bedtime as needed for sleep.         Marland Kitchen thiothixene (NAVANE) 2 MG capsule   Oral   Take 2 mg by mouth daily.           BP 109/57  Pulse 68  Temp(Src) 98.2 F (36.8 C) (Oral)  Resp 14  Ht 5\' 4"  (1.626 m)  Wt 165 lb (74.844 kg)  BMI 28.31 kg/m2  SpO2 99% Physical Exam  Nursing note and vitals reviewed. Constitutional: She appears well-developed and well-nourished.  HENT:  Head: Normocephalic and atraumatic.  Mouth/Throat: Uvula is midline. Mucous membranes are dry. No oropharyngeal exudate or posterior  oropharyngeal erythema.  Eyes: Conjunctivae are normal.  Neck: Normal range of motion.  Cardiovascular: Normal rate, regular rhythm, normal heart sounds and intact distal pulses.   Pulmonary/Chest: Effort normal and breath sounds normal. She has no wheezes.  Abdominal: Soft. Bowel sounds are increased. There is generalized tenderness. There is no guarding, no tenderness at McBurney's point and negative Murphy's sign.  Mild abdominal discomfort without focal pain.  Musculoskeletal: Normal range of motion.  Neurological: She is alert.  Skin: Skin is warm and dry.  Psychiatric: She has a normal mood and affect.    ED Course  Procedures (including critical care time) Labs Review Labs Reviewed  CBC WITH DIFFERENTIAL - Abnormal; Notable for the following:    RBC 3.55 (*)    Hemoglobin 10.5 (*)    HCT 32.4 (*)    Neutrophils Relative % 80 (*)    Neutro Abs 8.3 (*)    All other components within normal limits  BASIC METABOLIC PANEL - Abnormal; Notable for the following:    Sodium 134 (*)    CO2 15 (*)    Glucose, Bld 140 (*)    BUN 61 (*)    Creatinine, Ser 4.49 (*)    Calcium 11.6 (*)    GFR calc non Af Amer 10 (*)    GFR calc Af Amer 11 (*)    All other components within normal limits  LIPASE, BLOOD - Abnormal; Notable for the following:    Lipase 477 (*)    All other components within normal limits  LITHIUM LEVEL - Abnormal; Notable for the following:    Lithium Lvl 2.23 (*)    All other components within normal limits  URINALYSIS, ROUTINE W REFLEX MICROSCOPIC  HEPATIC FUNCTION PANEL   Imaging Review No results found.   EKG Interpretation None      Results for orders placed during the hospital encounter of 03/12/14  CBC WITH DIFFERENTIAL      Result Value Ref Range   WBC 10.4  4.0 - 10.5 K/uL   RBC 3.55 (*) 3.87 - 5.11 MIL/uL   Hemoglobin 10.5 (*) 12.0 - 15.0 g/dL   HCT 32.4 (*) 36.0 - 46.0 %   MCV 91.3  78.0 - 100.0 fL   MCH 29.6  26.0 - 34.0 pg   MCHC 32.4   30.0 - 36.0 g/dL   RDW 13.8  11.5 -  15.5 %   Platelets 296  150 - 400 K/uL   Neutrophils Relative % 80 (*) 43 - 77 %   Neutro Abs 8.3 (*) 1.7 - 7.7 K/uL   Lymphocytes Relative 15  12 - 46 %   Lymphs Abs 1.5  0.7 - 4.0 K/uL   Monocytes Relative 4  3 - 12 %   Monocytes Absolute 0.4  0.1 - 1.0 K/uL   Eosinophils Relative 1  0 - 5 %   Eosinophils Absolute 0.1  0.0 - 0.7 K/uL   Basophils Relative 1  0 - 1 %   Basophils Absolute 0.1  0.0 - 0.1 K/uL  BASIC METABOLIC PANEL      Result Value Ref Range   Sodium 134 (*) 137 - 147 mEq/L   Potassium 5.0  3.7 - 5.3 mEq/L   Chloride 107  96 - 112 mEq/L   CO2 15 (*) 19 - 32 mEq/L   Glucose, Bld 140 (*) 70 - 99 mg/dL   BUN 61 (*) 6 - 23 mg/dL   Creatinine, Ser 4.49 (*) 0.50 - 1.10 mg/dL   Calcium 11.6 (*) 8.4 - 10.5 mg/dL   GFR calc non Af Amer 10 (*) >90 mL/min   GFR calc Af Amer 11 (*) >90 mL/min  URINALYSIS, ROUTINE W REFLEX MICROSCOPIC      Result Value Ref Range   Color, Urine YELLOW  YELLOW   APPearance CLEAR  CLEAR   Specific Gravity, Urine 1.020  1.005 - 1.030   pH 5.5  5.0 - 8.0   Glucose, UA NEGATIVE  NEGATIVE mg/dL   Hgb urine dipstick NEGATIVE  NEGATIVE   Bilirubin Urine NEGATIVE  NEGATIVE   Ketones, ur NEGATIVE  NEGATIVE mg/dL   Protein, ur NEGATIVE  NEGATIVE mg/dL   Urobilinogen, UA 0.2  0.0 - 1.0 mg/dL   Nitrite NEGATIVE  NEGATIVE   Leukocytes, UA NEGATIVE  NEGATIVE  HEPATIC FUNCTION PANEL      Result Value Ref Range   Total Protein 7.4  6.0 - 8.3 g/dL   Albumin 4.2  3.5 - 5.2 g/dL   AST 14  0 - 37 U/L   ALT 11  0 - 35 U/L   Alkaline Phosphatase 114  39 - 117 U/L   Total Bilirubin 0.8  0.3 - 1.2 mg/dL   Bilirubin, Direct <0.2  0.0 - 0.3 mg/dL   Indirect Bilirubin NOT CALCULATED  0.3 - 0.9 mg/dL  LIPASE, BLOOD      Result Value Ref Range   Lipase 477 (*) 11 - 59 U/L  LITHIUM LEVEL      Result Value Ref Range   Lithium Lvl 2.23 (*) 0.80 - 1.40 mEq/L     MDM   Final diagnoses:  Nausea vomiting and diarrhea   Dehydration  Acute renal failure  Metabolic acidosis    Discussed case with Dr. Tomi Bamberger who agrees with need for admission.  Patient was given IV fluids while in the ED, discussed case with Dr. Sheran Fava who agrees with admission.  Temp admission orders placed.    Evalee Jefferson, PA-C 03/12/14 1354

## 2014-03-13 LAB — BASIC METABOLIC PANEL
BUN: 48 mg/dL — ABNORMAL HIGH (ref 6–23)
CO2: 15 mEq/L — ABNORMAL LOW (ref 19–32)
Calcium: 11.1 mg/dL — ABNORMAL HIGH (ref 8.4–10.5)
Chloride: 117 mEq/L — ABNORMAL HIGH (ref 96–112)
Creatinine, Ser: 2.92 mg/dL — ABNORMAL HIGH (ref 0.50–1.10)
GFR calc Af Amer: 19 mL/min — ABNORMAL LOW (ref >90)
GFR calc non Af Amer: 16 mL/min — ABNORMAL LOW (ref >90)
Glucose, Bld: 107 mg/dL — ABNORMAL HIGH (ref 70–99)
Potassium: 5 mEq/L (ref 3.7–5.3)
Sodium: 143 mEq/L (ref 137–147)

## 2014-03-13 LAB — CBC
HCT: 28.9 % — ABNORMAL LOW (ref 36.0–46.0)
Hemoglobin: 9.9 g/dL — ABNORMAL LOW (ref 12.0–15.0)
MCH: 31.3 pg (ref 26.0–34.0)
MCHC: 34.3 g/dL (ref 30.0–36.0)
MCV: 91.5 fL (ref 78.0–100.0)
Platelets: 262 10*3/uL (ref 150–400)
RBC: 3.16 MIL/uL — ABNORMAL LOW (ref 3.87–5.11)
RDW: 13.7 % (ref 11.5–15.5)
WBC: 10.3 10*3/uL (ref 4.0–10.5)

## 2014-03-13 LAB — IRON AND TIBC
Iron: 143 ug/dL — ABNORMAL HIGH (ref 42–135)
SATURATION RATIOS: 48 % (ref 20–55)
TIBC: 298 ug/dL (ref 250–470)
UIBC: 155 ug/dL (ref 125–400)

## 2014-03-13 LAB — VITAMIN B12: VITAMIN B 12: 473 pg/mL (ref 211–911)

## 2014-03-13 LAB — FERRITIN: FERRITIN: 186 ng/mL (ref 10–291)

## 2014-03-13 LAB — TSH: TSH: 1.73 u[IU]/mL (ref 0.350–4.500)

## 2014-03-13 LAB — LITHIUM LEVEL: Lithium Lvl: 1.73 mEq/L (ref 0.80–1.40)

## 2014-03-13 MED ORDER — ALLOPURINOL 300 MG PO TABS
300.0000 mg | ORAL_TABLET | Freq: Every day | ORAL | Status: DC
  Administered 2014-03-13 – 2014-03-14 (×2): 300 mg via ORAL
  Filled 2014-03-13 (×2): qty 1

## 2014-03-13 MED ORDER — THIOTHIXENE 2 MG PO CAPS
2.0000 mg | ORAL_CAPSULE | Freq: Every day | ORAL | Status: DC
  Administered 2014-03-13 – 2014-03-14 (×2): 2 mg via ORAL
  Filled 2014-03-13 (×3): qty 1

## 2014-03-13 NOTE — Care Management Note (Signed)
    Page 1 of 1   03/13/2014     3:48:54 PM   CARE MANAGEMENT NOTE 03/13/2014  Patient:  Samantha Conley, Samantha Conley   Account Number:  [id number]  Date Initiated:  03/13/2014  Documentation initiated by:  Claretha Cooper  Subjective/Objective Assessment:   Pt admitted from home where she lives at home. Very independent. No needs identified.     Action/Plan:   Anticipated DC Date:  03/15/2014   Anticipated DC Plan:  HOME/SELF CARE         Choice offered to / List presented to:             Status of service:  Completed, signed off Medicare Important Message given?   (If response is "NO", the following Medicare IM given date fields will be blank) Date Medicare IM given:   Date Additional Medicare IM given:    Discharge Disposition:    Per UR Regulation:    If discussed at Long Length of Stay Meetings, dates discussed:    Comments:  03/13/14 Claretha Cooper RN BSN SW

## 2014-03-13 NOTE — Progress Notes (Signed)
Subjective: She feels better. She has less nausea. She only vomited once yesterday. She does not have any diarrhea.  Objective: Vital signs in last 24 hours: Temp:  [97.7 F (36.5 C)-98.5 F (36.9 C)] 97.7 F (36.5 C) (04/10 0517) Pulse Rate:  [59-96] 59 (04/10 0517) Resp:  [14-21] 18 (04/10 0517) BP: (95-129)/(51-72) 95/61 mmHg (04/10 0517) SpO2:  [96 %-99 %] 98 % (04/10 0517) Weight:  [74.844 kg (165 lb)-77.3 kg (170 lb 6.7 oz)] 77.3 kg (170 lb 6.7 oz) (04/10 0500) Weight change:     Intake/Output from previous day: 04/09 0701 - 04/10 0700 In: 1354.2 [I.V.:1354.2] Out: 500 [Urine:500]  PHYSICAL EXAM General appearance: alert, cooperative and moderate distress Resp: clear to auscultation bilaterally Cardio: regular rate and rhythm, S1, S2 normal, no murmur, click, rub or gallop GI: Her abdomen is mildly tender with hyperactive bowel sounds Extremities: extremities normal, atraumatic, no cyanosis or edema  Lab Results:    Basic Metabolic Panel:  Recent Labs  03/12/14 1048 03/13/14 0604  NA 134* 143  K 5.0 5.0  CL 107 117*  CO2 15* 15*  GLUCOSE 140* 107*  BUN 61* 48*  CREATININE 4.49* 2.92*  CALCIUM 11.6* 11.1*   Liver Function Tests:  Recent Labs  03/12/14 1048  AST 14  ALT 11  ALKPHOS 114  BILITOT 0.8  PROT 7.4  ALBUMIN 4.2    Recent Labs  03/12/14 1048  LIPASE 477*   No results found for this basename: AMMONIA,  in the last 72 hours CBC:  Recent Labs  03/12/14 1048 03/13/14 0604  WBC 10.4 10.3  NEUTROABS 8.3*  --   HGB 10.5* 9.9*  HCT 32.4* 28.9*  MCV 91.3 91.5  PLT 296 262   Cardiac Enzymes: No results found for this basename: CKTOTAL, CKMB, CKMBINDEX, TROPONINI,  in the last 72 hours BNP: No results found for this basename: PROBNP,  in the last 72 hours D-Dimer: No results found for this basename: DDIMER,  in the last 72 hours CBG: No results found for this basename: GLUCAP,  in the last 72 hours Hemoglobin A1C: No results  found for this basename: HGBA1C,  in the last 72 hours Fasting Lipid Panel: No results found for this basename: CHOL, HDL, LDLCALC, TRIG, CHOLHDL, LDLDIRECT,  in the last 72 hours Thyroid Function Tests: No results found for this basename: TSH, T4TOTAL, FREET4, T3FREE, THYROIDAB,  in the last 72 hours Anemia Panel: No results found for this basename: VITAMINB12, FOLATE, FERRITIN, TIBC, IRON, RETICCTPCT,  in the last 72 hours Coagulation: No results found for this basename: LABPROT, INR,  in the last 72 hours Urine Drug Screen: Drugs of Abuse  No results found for this basename: labopia, cocainscrnur, labbenz, amphetmu, thcu, labbarb    Alcohol Level: No results found for this basename: ETH,  in the last 72 hours Urinalysis:  Recent Labs  03/12/14 1015  COLORURINE YELLOW  LABSPEC 1.020  PHURINE 5.5  GLUCOSEU NEGATIVE  HGBUR NEGATIVE  BILIRUBINUR NEGATIVE  KETONESUR NEGATIVE  PROTEINUR NEGATIVE  UROBILINOGEN 0.2  NITRITE NEGATIVE  [place]. Labs:  ABGS No results found for this basename: PHART, PCO2, PO2ART, TCO2, HCO3,  in the last 72 hours CULTURES No results found for this or any previous visit (from the past 240 hour(s)). Studies/Results: Dg Chest Port 1 View  03/12/2014   CLINICAL DATA:  Cough.  Short of breath.  EXAM: PORTABLE CHEST - 1 VIEW  COMPARISON:  12/22/2013  FINDINGS: Cardiac silhouette is normal  in size. Normal mediastinal and hilar contours. Mild lung base opacity most consistent with atelectasis/ scarring. This is stable. Lungs are otherwise clear. No pleural effusion. No pneumothorax.  Bony thorax is demineralized but intact.  IMPRESSION: No acute cardiopulmonary disease.   Electronically Signed   By: Lajean Manes M.D.   On: 03/12/2014 17:14   Dg Abd Portable 1v  03/12/2014   CLINICAL DATA:  Nausea and vomiting.  EXAM: PORTABLE ABDOMEN - 1 VIEW  COMPARISON:  None.  FINDINGS: Normal bowel gas pattern.  Changes from cholecystectomy.  Soft  tissues otherwise unremarkable.  Bones are demineralized. Mild degenerative changes of the lower lumbar spine  IMPRESSION: 1. No acute findings.  No obstruction.   Electronically Signed   By: Lajean Manes M.D.   On: 03/12/2014 17:14    Medications:  Prior to Admission:  Prescriptions prior to admission  Medication Sig Dispense Refill  . acetaminophen (TYLENOL) 500 MG tablet Take 500 mg by mouth every 6 (six) hours as needed for mild pain.      Marland Kitchen albuterol (PROVENTIL HFA;VENTOLIN HFA) 108 (90 BASE) MCG/ACT inhaler Inhale 2 puffs into the lungs 2 (two) times daily.      Marland Kitchen allopurinol (ZYLOPRIM) 300 MG tablet Take 300 mg by mouth daily.      Marland Kitchen amLODipine (NORVASC) 5 MG tablet Take 5 mg by mouth daily.      . diphenhydrAMINE (SOMINEX) 25 MG tablet Take 25 mg by mouth at bedtime as needed for sleep.       Marland Kitchen lisinopril (PRINIVIL,ZESTRIL) 10 MG tablet Take 10 mg by mouth daily.      Marland Kitchen lithium carbonate (ESKALITH) 450 MG CR tablet Take 450 mg by mouth at bedtime.      . temazepam (RESTORIL) 30 MG capsule Take 30 mg by mouth at bedtime as needed for sleep.      Marland Kitchen thiothixene (NAVANE) 2 MG capsule Take 2 mg by mouth daily.        Scheduled: . albuterol  2.5 mg Nebulization BID  . allopurinol  300 mg Oral Daily  . heparin  5,000 Units Subcutaneous 3 times per day  . thiothixene  2 mg Oral Daily   Continuous: . sodium chloride 125 mL/hr at 03/12/14 1810   [id number], diphenhydrAMINE, ondansetron (ZOFRAN) IV, ondansetron, temazepam  Assesment: She was admitted with nausea and vomiting and severe dehydration. 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 This is caused acute kidney injury which is better. She seems to be resolving some of the gastrointestinal symptoms. She has bipolar disease and has been on lithium and her lithium level is elevated probably from the renal dysfunction Active Problems:   Hypertension   Gout   Dehydration, severe   Nausea and  vomiting   Diarrhea   Normocytic anemia   Hypercalcemia   AKI (acute kidney injury)    Plan: Continue IV fluids she wants to try clear liquids.    LOS: 1 day   Alonza Bogus 03/13/2014, 8:36 AM

## 2014-03-14 DIAGNOSIS — F319 Bipolar disorder, unspecified: Secondary | ICD-10-CM | POA: Diagnosis present

## 2014-03-14 LAB — BASIC METABOLIC PANEL
BUN: 29 mg/dL — AB (ref 6–23)
CO2: 15 meq/L — AB (ref 19–32)
CREATININE: 2.07 mg/dL — AB (ref 0.50–1.10)
Calcium: 10.4 mg/dL (ref 8.4–10.5)
Chloride: 118 mEq/L — ABNORMAL HIGH (ref 96–112)
GFR calc Af Amer: 29 mL/min — ABNORMAL LOW (ref >90)
GFR, EST NON AFRICAN AMERICAN: 25 mL/min — AB (ref >90)
GLUCOSE: 115 mg/dL — AB (ref 70–99)
Potassium: 4.9 mEq/L (ref 3.7–5.3)
Sodium: 142 mEq/L (ref 137–147)

## 2014-03-14 LAB — LITHIUM LEVEL: Lithium Lvl: 1.14 mEq/L (ref 0.80–1.40)

## 2014-03-14 NOTE — Plan of Care (Signed)
Problem: Phase I Progression Outcomes Goal: OOB as tolerated unless otherwise ordered Outcome: Progressing 03/14/14 2319 patient ambulates in room with nursing staff supervision. General weakness, instructed to call for assist and not attempt getting up on her own. bedalarm on for safety. Donavan Foil, RN

## 2014-03-14 NOTE — Progress Notes (Signed)
03/14/14 2223 Late entry for 2100. Discussed enteric precautions education with patient. Verbalized understanding. Instructed to notify nursing staff of any further stools for stool specimens to be obtained. Donavan Foil, RN

## 2014-03-14 NOTE — Progress Notes (Signed)
03/13/14 2223  Psychosocial  Psychosocial (WDL) X  Patient Behaviors Agitated  Needs Expressed Denies  Emotional support given Given to patient  Pt transferred to room 324 from 337 due to environmental noise in neighboring rooms-causing anxiety.  Pt with emesis with movement with transfer to new room.  Relieved by antiemetic.

## 2014-03-14 NOTE — Progress Notes (Signed)
Subjective: She feels better. No new complaints. She still has some nausea but feels that she can advance her diet somewhat.  Objective: Vital signs in last 24 hours: Temp:  [97.4 F (36.3 C)-98.4 F (36.9 C)] 98.4 F (36.9 C) (04/10 2244) Pulse Rate:  [61-76] 76 (04/11 0723) Resp:  [18-20] 18 (04/11 0632) BP: (101-115)/(56-63) 108/63 mmHg (04/11 0632) SpO2:  [95 %-100 %] 96 % (04/11 0723) Weight:  [77.565 kg (171 lb)] 77.565 kg (171 lb) (04/11 0500) Weight change: 2.722 kg (6 lb) Last BM Date: 03/14/14  Intake/Output from previous day: 04/10 0701 - 04/11 0700 In: 3630 [P.O.:380; I.V.:3250] Out: 1200 [Urine:1200]  PHYSICAL EXAM General appearance: alert, cooperative and mild distress Resp: clear to auscultation bilaterally Cardio: regular rate and rhythm, S1, S2 normal, no murmur, click, rub or gallop GI: Minimally tender with hyperactive bowel sounds Extremities: extremities normal, atraumatic, no cyanosis or edema  Lab Results:    Basic Metabolic Panel:  Recent Labs  03/13/14 0604 03/14/14 0554  NA 143 142  K 5.0 4.9  CL 117* 118*  CO2 15* 15*  GLUCOSE 107* 115*  BUN 48* 29*  CREATININE 2.92* 2.07*  CALCIUM 11.1* 10.4   Liver Function Tests:  Recent Labs  03/12/14 1048  AST 14  ALT 11  ALKPHOS 114  BILITOT 0.8  PROT 7.4  ALBUMIN 4.2    Recent Labs  03/12/14 1048  LIPASE 477*   No results found for this basename: AMMONIA,  in the last 72 hours CBC:  Recent Labs  03/12/14 1048 03/13/14 0604  WBC 10.4 10.3  NEUTROABS 8.3*  --   HGB 10.5* 9.9*  HCT 32.4* 28.9*  MCV 91.3 91.5  PLT 296 262   Cardiac Enzymes: No results found for this basename: CKTOTAL, CKMB, CKMBINDEX, TROPONINI,  in the last 72 hours BNP: No results found for this basename: PROBNP,  in the last 72 hours D-Dimer: No results found for this basename: DDIMER,  in the last 72 hours CBG: No results found for this basename: GLUCAP,  in the last 72 hours Hemoglobin A1C: No  results found for this basename: HGBA1C,  in the last 72 hours Fasting Lipid Panel: No results found for this basename: CHOL, HDL, LDLCALC, TRIG, CHOLHDL, LDLDIRECT,  in the last 72 hours Thyroid Function Tests:  Recent Labs  03/13/14 0604  TSH 1.730   Anemia Panel:  Recent Labs  03/13/14 0604  VITAMINB12 473  FERRITIN 186  TIBC 298  IRON 143*   Coagulation: No results found for this basename: LABPROT, INR,  in the last 72 hours Urine Drug Screen: Drugs of Abuse  No results found for this basename: labopia, cocainscrnur, labbenz, amphetmu, thcu, labbarb    Alcohol Level: No results found for this basename: ETH,  in the last 72 hours Urinalysis:  Recent Labs  03/12/14 1015  COLORURINE YELLOW  LABSPEC 1.020  PHURINE 5.5  GLUCOSEU NEGATIVE  HGBUR NEGATIVE  BILIRUBINUR NEGATIVE  KETONESUR NEGATIVE  PROTEINUR NEGATIVE  UROBILINOGEN 0.2  NITRITE NEGATIVE  [place]. Labs:  ABGS No results found for this basename: PHART, PCO2, PO2ART, TCO2, HCO3,  in the last 72 hours CULTURES No results found for this or any previous visit (from the past 240 hour(s)). Studies/Results: Dg Chest Port 1 View  03/12/2014   CLINICAL DATA:  Cough.  Short of breath.  EXAM: PORTABLE CHEST - 1 VIEW  COMPARISON:  12/22/2013  FINDINGS: Cardiac silhouette is normal in size. Normal mediastinal and hilar  contours. Mild lung base opacity most consistent with atelectasis/ scarring. This is stable. Lungs are otherwise clear. No pleural effusion. No pneumothorax.  Bony thorax is demineralized but intact.  IMPRESSION: No acute cardiopulmonary disease.   Electronically Signed   By: Lajean Manes M.D.   On: 03/12/2014 17:14   Dg Abd Portable 1v  03/12/2014   CLINICAL DATA:  Nausea and vomiting.  EXAM: PORTABLE ABDOMEN - 1 VIEW  COMPARISON:  None.  FINDINGS: Normal bowel gas pattern.  Changes from cholecystectomy.  Soft tissues otherwise unremarkable.  Bones are demineralized. Mild  degenerative changes of the lower lumbar spine  IMPRESSION: 1. No acute findings.  No obstruction.   Electronically Signed   By: Lajean Manes M.D.   On: 03/12/2014 17:14    Medications:  Prior to Admission:  Prescriptions prior to admission  Medication Sig Dispense Refill  . acetaminophen (TYLENOL) 500 MG tablet Take 500 mg by mouth every 6 (six) hours as needed for mild pain.      Marland Kitchen albuterol (PROVENTIL HFA;VENTOLIN HFA) 108 (90 BASE) MCG/ACT inhaler Inhale 2 puffs into the lungs 2 (two) times daily.      Marland Kitchen allopurinol (ZYLOPRIM) 300 MG tablet Take 300 mg by mouth daily.      Marland Kitchen amLODipine (NORVASC) 5 MG tablet Take 5 mg by mouth daily.      . diphenhydrAMINE (SOMINEX) 25 MG tablet Take 25 mg by mouth at bedtime as needed for sleep.       Marland Kitchen lisinopril (PRINIVIL,ZESTRIL) 10 MG tablet Take 10 mg by mouth daily.      Marland Kitchen lithium carbonate (ESKALITH) 450 MG CR tablet Take 450 mg by mouth at bedtime.      . temazepam (RESTORIL) 30 MG capsule Take 30 mg by mouth at bedtime as needed for sleep.      Marland Kitchen thiothixene (NAVANE) 2 MG capsule Take 2 mg by mouth daily.        Scheduled: . albuterol  2.5 mg Nebulization BID  . allopurinol  300 mg Oral QHS  . heparin  5,000 Units Subcutaneous 3 times per day  . thiothixene  2 mg Oral QHS   Continuous: . sodium chloride 125 mL/hr at 03/14/14 0911   [id number], diphenhydrAMINE, ondansetron (ZOFRAN) IV, ondansetron, temazepam  Assesment: She was admitted with severe dehydration and acute kidney injury from that. She had viral gastroenteritis with nausea vomiting and diarrhea. She has bipolar disease and has been on lithium for that and she was lithium toxic on admission as well related to her dehydration. She seems to be improving. Her renal function is much improved. She will have lithium level tomorrow. There is some potential that she could be discharged home tomorrow Active Problems:   Hypertension   Gout   Dehydration, severe   Nausea and  vomiting   Diarrhea   Normocytic anemia   Hypercalcemia   AKI (acute kidney injury)    Plan: Continue IV fluids. Advance her diet. Repeat basic metabolic profile and lithium level tomorrow    LOS: 2 days   Alonza Bogus 03/14/2014, 10:44 AM

## 2014-03-15 LAB — BASIC METABOLIC PANEL
BUN: 19 mg/dL (ref 6–23)
CALCIUM: 9.9 mg/dL (ref 8.4–10.5)
CO2: 14 meq/L — AB (ref 19–32)
Chloride: 116 mEq/L — ABNORMAL HIGH (ref 96–112)
Creatinine, Ser: 1.68 mg/dL — ABNORMAL HIGH (ref 0.50–1.10)
GFR calc Af Amer: 37 mL/min — ABNORMAL LOW (ref >90)
GFR, EST NON AFRICAN AMERICAN: 32 mL/min — AB (ref >90)
Glucose, Bld: 102 mg/dL — ABNORMAL HIGH (ref 70–99)
Potassium: 4.3 mEq/L (ref 3.7–5.3)
Sodium: 140 mEq/L (ref 137–147)

## 2014-03-15 LAB — OCCULT BLOOD X 1 CARD TO LAB, STOOL: FECAL OCCULT BLD: NEGATIVE

## 2014-03-15 LAB — FOLATE RBC: RBC FOLATE: 545 ng/mL (ref >280)

## 2014-03-15 LAB — CLOSTRIDIUM DIFFICILE BY PCR: Toxigenic C. Difficile by PCR: NEGATIVE

## 2014-03-15 MED ORDER — SODIUM BICARBONATE 650 MG PO TABS: 650.0000 mg | ORAL_TABLET | Freq: Four times a day (QID) | ORAL | Status: DC

## 2014-03-15 NOTE — Progress Notes (Signed)
03/15/14 0555 Patient reports no further stool during the night, unable to obtain stool specimen. Will notify day shift RN to monitor. Donavan Foil, RN

## 2014-03-15 NOTE — Discharge Summary (Signed)
Physician Discharge Summary  Patient ID: Samantha Conley MRN: [id number] DOB/AGE: 1951/01/21 63 y.o. Primary Care Physician:Deann Mclaine L, MD Admit date: 03/12/2014 Discharge date: 03/15/2014    Discharge Diagnoses:   Active Problems:   Hypertension   Gout   Dehydration, severe   Nausea and vomiting   Diarrhea   Normocytic anemia   Hypercalcemia   AKI (acute kidney injury)   Bipolar disease, chronic  metabolic acidemia from acute kidney injury    Medication List         acetaminophen 500 MG tablet  Commonly known as:  TYLENOL  Take 500 mg by mouth every 6 (six) hours as needed for mild pain.     albuterol 108 (90 BASE) MCG/ACT inhaler  Commonly known as:  PROVENTIL HFA;VENTOLIN HFA  Inhale 2 puffs into the lungs 2 (two) times daily.     allopurinol 300 MG tablet  Commonly known as:  ZYLOPRIM  Take 300 mg by mouth daily.     amLODipine 5 MG tablet  Commonly known as:  NORVASC  Take 5 mg by mouth daily.     diphenhydrAMINE 25 MG tablet  Commonly known as:  SOMINEX  Take 25 mg by mouth at bedtime as needed for sleep.     lisinopril 10 MG tablet  Commonly known as:  PRINIVIL,ZESTRIL  Take 10 mg by mouth daily.     lithium carbonate 450 MG CR tablet  Commonly known as:  ESKALITH  Take 450 mg by mouth at bedtime.     sodium bicarbonate 650 MG tablet  Take 1 tablet (650 mg total) by mouth 4 (four) times daily.     temazepam 30 MG capsule  Commonly known as:  RESTORIL  Take 30 mg by mouth at bedtime as needed for sleep.     thiothixene 2 MG capsule  Commonly known as:  NAVANE  Take 2 mg by mouth daily.        Discharged Condition: Improved    Consults: None  Significant Diagnostic Studies: Dg Chest Port 1 View  03/12/2014   CLINICAL DATA:  Cough.  Short of breath.  EXAM: PORTABLE CHEST - 1 VIEW  COMPARISON:  12/22/2013  FINDINGS: Cardiac silhouette is normal in size. Normal mediastinal and hilar contours. Mild lung base opacity most consistent with  atelectasis/ scarring. This is stable. Lungs are otherwise clear. No pleural effusion. No pneumothorax.  Bony thorax is demineralized but intact.  IMPRESSION: No acute cardiopulmonary disease.   Electronically Signed   By: Lajean Manes M.D.   On: 03/12/2014 17:14   Dg Abd Portable 1v  03/12/2014   CLINICAL DATA:  Nausea and vomiting.  EXAM: PORTABLE ABDOMEN - 1 VIEW  COMPARISON:  None.  FINDINGS: Normal bowel gas pattern.  Changes from cholecystectomy.  Soft tissues otherwise unremarkable.  Bones are demineralized. Mild degenerative changes of the lower lumbar spine  IMPRESSION: 1. No acute findings.  No obstruction.   Electronically Signed   By: Lajean Manes M.D.   On: 03/12/2014 17:14    Lab Results: Basic Metabolic Panel:  Recent Labs  03/14/14 0554 03/15/14 0603  NA 142 140  K 4.9 4.3  CL 118* 116*  CO2 15* 14*  GLUCOSE 115* 102*  BUN 29* 19  CREATININE 2.07* 1.68*  CALCIUM 10.4 9.9   Liver Function Tests:  Recent Labs  03/12/14 1048  AST 14  ALT 11  ALKPHOS 114  BILITOT 0.8  PROT 7.4  ALBUMIN 4.2     CBC:  Recent Labs  03/12/14 1048 03/13/14 0604  WBC 10.4 10.3  NEUTROABS 8.3*  --   HGB 10.5* 9.9*  HCT 32.4* 28.9*  MCV 91.3 91.5  PLT 296 262    No results found for this or any previous visit (from the past 240 hour(s)).   Hospital Course: This is a 63 year old who has an approximately week long history of nausea vomiting and diarrhea. She tried over-the-counter medications with no help. She eventually came to the emergency department was found to be profoundly dehydrated with acute kidney injury and elevated lithium level. She was admitted for IV therapy. She was started on intravenous fluids placed on clear liquid diet her lithium was held and she improved over the next 48-72 hours. Her renal function improved from a creatinine of greater than 4 to a creatinine of about 1.7. She remained somewhat acidemic with bicarbonate levels of 14 and 15. Her lithium  level came down to therapeutic. She was able to take full liquids without any problems. She wanted to go home. She is markedly improved. Her nausea vomiting and diarrhea totally resolved  Discharge Exam: Blood pressure 116/60, pulse 61, temperature 98 F (36.7 C), temperature source Oral, resp. rate 18, height 5\' 4"  (1.626 m), weight 76.476 kg (168 lb 9.6 oz), SpO2 99.00%. She is awake and alert. Mucous membranes are moist. Her neck is supple. Her chest is clear. Her abdomen is soft  Disposition: Home. She will follow in my office next week and have laboratory work next week. She will be on sodium bicarbonate short term.      Signed: Alonza Bogus   03/15/2014, 9:54 AM

## 2014-03-15 NOTE — Progress Notes (Signed)
Subjective: She feels better. She has not had any further vomiting or diarrhea. She wants to go home. She says she feels dizzy from taking Restoril for sleep  Objective: Vital signs in last 24 hours: Temp:  [98 F (36.7 C)-98.5 F (36.9 C)] 98 F (36.7 C) (04/12 0703) Pulse Rate:  [59-80] 61 (04/12 0703) Resp:  [18-20] 18 (04/12 0703) BP: (113-118)/(60-71) 116/60 mmHg (04/12 0703) SpO2:  [97 %-99 %] 99 % (04/12 0703) Weight:  [76.476 kg (168 lb 9.6 oz)] 76.476 kg (168 lb 9.6 oz) (04/12 0649) Weight change: -1.089 kg (-2 lb 6.4 oz) Last BM Date: 03/14/14  Intake/Output from previous day: 04/11 0701 - 04/12 0700 In: 2457.9 [P.O.:960; I.V.:1497.9] Out: 400 [Urine:400]  PHYSICAL EXAM General appearance: alert, cooperative and no distress Resp: clear to auscultation bilaterally Cardio: regular rate and rhythm, S1, S2 normal, no murmur, click, rub or gallop GI: soft, non-tender; bowel sounds normal; no masses,  no organomegaly Extremities: extremities normal, atraumatic, no cyanosis or edema  Lab Results:    Basic Metabolic Panel:  Recent Labs  03/14/14 0554 03/15/14 0603  NA 142 140  K 4.9 4.3  CL 118* 116*  CO2 15* 14*  GLUCOSE 115* 102*  BUN 29* 19  CREATININE 2.07* 1.68*  CALCIUM 10.4 9.9   Liver Function Tests:  Recent Labs  03/12/14 1048  AST 14  ALT 11  ALKPHOS 114  BILITOT 0.8  PROT 7.4  ALBUMIN 4.2    Recent Labs  03/12/14 1048  LIPASE 477*   No results found for this basename: AMMONIA,  in the last 72 hours CBC:  Recent Labs  03/12/14 1048 03/13/14 0604  WBC 10.4 10.3  NEUTROABS 8.3*  --   HGB 10.5* 9.9*  HCT 32.4* 28.9*  MCV 91.3 91.5  PLT 296 262   Cardiac Enzymes: No results found for this basename: CKTOTAL, CKMB, CKMBINDEX, TROPONINI,  in the last 72 hours BNP: No results found for this basename: PROBNP,  in the last 72 hours D-Dimer: No results found for this basename: DDIMER,  in the last 72 hours CBG: No results found  for this basename: GLUCAP,  in the last 72 hours Hemoglobin A1C: No results found for this basename: HGBA1C,  in the last 72 hours Fasting Lipid Panel: No results found for this basename: CHOL, HDL, LDLCALC, TRIG, CHOLHDL, LDLDIRECT,  in the last 72 hours Thyroid Function Tests:  Recent Labs  03/13/14 0604  TSH 1.730   Anemia Panel:  Recent Labs  03/13/14 0604  VITAMINB12 473  FERRITIN 186  TIBC 298  IRON 143*   Coagulation: No results found for this basename: LABPROT, INR,  in the last 72 hours Urine Drug Screen: Drugs of Abuse  No results found for this basename: labopia, cocainscrnur, labbenz, amphetmu, thcu, labbarb    Alcohol Level: No results found for this basename: ETH,  in the last 72 hours Urinalysis:  Recent Labs  03/12/14 1015  COLORURINE YELLOW  LABSPEC 1.020  PHURINE 5.5  GLUCOSEU NEGATIVE  HGBUR NEGATIVE  BILIRUBINUR NEGATIVE  KETONESUR NEGATIVE  PROTEINUR NEGATIVE  UROBILINOGEN 0.2  NITRITE NEGATIVE  [place]. Labs:  ABGS No results found for this basename: PHART, PCO2, PO2ART, TCO2, HCO3,  in the last 72 hours CULTURES No results found for this or any previous visit (from the past 240 hour(s)). Studies/Results: No results found.  Medications:  Prior to Admission:  Prescriptions prior to admission  Medication Sig Dispense Refill  . acetaminophen (TYLENOL)  500 MG tablet Take 500 mg by mouth every 6 (six) hours as needed for mild pain.      Marland Kitchen albuterol (PROVENTIL HFA;VENTOLIN HFA) 108 (90 BASE) MCG/ACT inhaler Inhale 2 puffs into the lungs 2 (two) times daily.      Marland Kitchen allopurinol (ZYLOPRIM) 300 MG tablet Take 300 mg by mouth daily.      Marland Kitchen amLODipine (NORVASC) 5 MG tablet Take 5 mg by mouth daily.      . diphenhydrAMINE (SOMINEX) 25 MG tablet Take 25 mg by mouth at bedtime as needed for sleep.       Marland Kitchen lisinopril (PRINIVIL,ZESTRIL) 10 MG tablet Take 10 mg by mouth daily.      Marland Kitchen lithium carbonate (ESKALITH) 450 MG CR  tablet Take 450 mg by mouth at bedtime.      . temazepam (RESTORIL) 30 MG capsule Take 30 mg by mouth at bedtime as needed for sleep.      Marland Kitchen thiothixene (NAVANE) 2 MG capsule Take 2 mg by mouth daily.        Scheduled: . albuterol  2.5 mg Nebulization BID  . allopurinol  300 mg Oral QHS  . heparin  5,000 Units Subcutaneous 3 times per day  . thiothixene  2 mg Oral QHS   Continuous: . sodium chloride 125 mL/hr at 03/15/14 0418   [id number], diphenhydrAMINE, ondansetron (ZOFRAN) IV, ondansetron, temazepam  Assesment: She was admitted with nausea vomiting and diarrhea. She was severely dehydrated. She had acute injury to her kidney which is much improved. Active Problems:   Hypertension   Gout   Dehydration, severe   Nausea and vomiting   Diarrhea   Normocytic anemia   Hypercalcemia   AKI (acute kidney injury)   Bipolar disease, chronic    Plan: I think she can be discharged home safely once she's over her dizziness from Restoril. She will return to my office next week and have laboratory work next week    LOS: 3 days   Alonza Bogus 03/15/2014, 9:41 AM

## 2014-03-15 NOTE — Progress Notes (Signed)
IV removed, site WNL.  Pt given d/c instructions and new prescriptions.  Discussed all home medications (when, how, and why to take), patient verbalizes understanding. Discussed home care with patient, teachback completed. F/U appointment to be made by patient. Education given on diet. Pt is stable at this time. Pt taken to main entrance in wheelchair by staff member.

## 2014-03-16 LAB — GI PATHOGEN PANEL BY PCR, STOOL
C DIFFICILE TOXIN A/B: NEGATIVE
CAMPYLOBACTER BY PCR: NEGATIVE
Cryptosporidium by PCR: NEGATIVE
E COLI (ETEC) LT/ST: NEGATIVE
E COLI 0157 BY PCR: NEGATIVE
E coli (STEC): NEGATIVE
G lamblia by PCR: NEGATIVE
Norovirus GI/GII: NEGATIVE
Rotavirus A by PCR: NEGATIVE
SHIGELLA BY PCR: NEGATIVE
Salmonella by PCR: POSITIVE

## 2014-03-17 LAB — PROTEIN ELECTROPHORESIS, SERUM
Albumin ELP: 63 % (ref 55.8–66.1)
Alpha-1-Globulin: 4.5 % (ref 2.9–4.9)
Alpha-2-Globulin: 13.2 % — ABNORMAL HIGH (ref 7.1–11.8)
Beta 2: 4.2 % (ref 3.2–6.5)
Beta Globulin: 6.4 % (ref 4.7–7.2)
Gamma Globulin: 8.7 % — ABNORMAL LOW (ref 11.1–18.8)
M-SPIKE, %: NOT DETECTED g/dL
TOTAL PROTEIN ELP: 6 g/dL (ref 6.0–8.3)

## 2014-03-17 LAB — OVA AND PARASITE EXAMINATION: OVA AND PARASITES: NONE SEEN

## 2014-03-19 LAB — STOOL CULTURE

## 2014-04-19 ENCOUNTER — Encounter (HOSPITAL_COMMUNITY): Payer: Self-pay | Admitting: Emergency Medicine

## 2014-04-19 ENCOUNTER — Inpatient Hospital Stay (HOSPITAL_COMMUNITY)
Admission: EM | Admit: 2014-04-19 | Discharge: 2014-04-25 | DRG: 683 | Disposition: A | Discharge status: Home / Self Care | Payer: 59 | Attending: Pulmonary Disease | Admitting: Pulmonary Disease

## 2014-04-19 DIAGNOSIS — I10 Essential (primary) hypertension: Secondary | ICD-10-CM | POA: Diagnosis present

## 2014-04-19 DIAGNOSIS — T438X5A Adverse effect of other psychotropic drugs, initial encounter: Secondary | ICD-10-CM | POA: Diagnosis present

## 2014-04-19 DIAGNOSIS — F319 Bipolar disorder, unspecified: Secondary | ICD-10-CM

## 2014-04-19 DIAGNOSIS — E872 Acidosis, unspecified: Secondary | ICD-10-CM | POA: Diagnosis present

## 2014-04-19 DIAGNOSIS — Z823 Family history of stroke: Secondary | ICD-10-CM

## 2014-04-19 DIAGNOSIS — T56891A Toxic effect of other metals, accidental (unintentional), initial encounter: Secondary | ICD-10-CM | POA: Diagnosis present

## 2014-04-19 DIAGNOSIS — Z23 Encounter for immunization: Secondary | ICD-10-CM

## 2014-04-19 DIAGNOSIS — E860 Dehydration: Secondary | ICD-10-CM | POA: Diagnosis present

## 2014-04-19 DIAGNOSIS — M109 Gout, unspecified: Secondary | ICD-10-CM | POA: Diagnosis present

## 2014-04-19 DIAGNOSIS — F332 Major depressive disorder, recurrent severe without psychotic features: Secondary | ICD-10-CM | POA: Diagnosis present

## 2014-04-19 DIAGNOSIS — J45909 Unspecified asthma, uncomplicated: Secondary | ICD-10-CM | POA: Diagnosis present

## 2014-04-19 DIAGNOSIS — Z8249 Family history of ischemic heart disease and other diseases of the circulatory system: Secondary | ICD-10-CM

## 2014-04-19 DIAGNOSIS — N179 Acute kidney failure, unspecified: Principal | ICD-10-CM | POA: Diagnosis present

## 2014-04-19 DIAGNOSIS — I959 Hypotension, unspecified: Secondary | ICD-10-CM | POA: Diagnosis present

## 2014-04-19 HISTORY — DX: Bipolar disorder, unspecified: F31.9

## 2014-04-19 LAB — CBC WITH DIFFERENTIAL/PLATELET
BASOS ABS: 0 10*3/uL (ref 0.0–0.1)
Basophils Relative: 0 % (ref 0–1)
EOS ABS: 0.1 10*3/uL (ref 0.0–0.7)
EOS PCT: 1 % (ref 0–5)
HEMATOCRIT: 32.2 % — AB (ref 36.0–46.0)
Hemoglobin: 10.5 g/dL — ABNORMAL LOW (ref 12.0–15.0)
LYMPHS ABS: 1.2 10*3/uL (ref 0.7–4.0)
LYMPHS PCT: 12 % (ref 12–46)
MCH: 29.4 pg (ref 26.0–34.0)
MCHC: 32.6 g/dL (ref 30.0–36.0)
MCV: 90.2 fL (ref 78.0–100.0)
MONO ABS: 0.5 10*3/uL (ref 0.1–1.0)
Monocytes Relative: 5 % (ref 3–12)
Neutro Abs: 8.4 10*3/uL — ABNORMAL HIGH (ref 1.7–7.7)
Neutrophils Relative %: 82 % — ABNORMAL HIGH (ref 43–77)
PLATELETS: 317 10*3/uL (ref 150–400)
RBC: 3.57 MIL/uL — ABNORMAL LOW (ref 3.87–5.11)
RDW: 14.8 % (ref 11.5–15.5)
WBC: 10.3 10*3/uL (ref 4.0–10.5)

## 2014-04-19 LAB — BLOOD GAS, VENOUS
ACID-BASE EXCESS: 10.4 mmol/L — AB (ref 0.0–2.0)
Bicarbonate: 14.6 mEq/L — ABNORMAL LOW (ref 20.0–24.0)
FIO2: 21 %
O2 SAT: 70.8 %
PATIENT TEMPERATURE: 37
TCO2: 13.7 mmol/L (ref 0–100)
pCO2, Ven: 29.8 mmHg — ABNORMAL LOW (ref 45.0–50.0)
pH, Ven: 7.31 — ABNORMAL HIGH (ref 7.250–7.300)
pO2, Ven: 37.1 mmHg (ref 30.0–45.0)

## 2014-04-19 LAB — COMPREHENSIVE METABOLIC PANEL
ALT: 10 U/L (ref 0–35)
AST: 14 U/L (ref 0–37)
Albumin: 4.1 g/dL (ref 3.5–5.2)
Alkaline Phosphatase: 157 U/L — ABNORMAL HIGH (ref 39–117)
BUN: 50 mg/dL — ABNORMAL HIGH (ref 6–23)
CO2: 15 meq/L — AB (ref 19–32)
CREATININE: 4.36 mg/dL — AB (ref 0.50–1.10)
Calcium: 10.7 mg/dL — ABNORMAL HIGH (ref 8.4–10.5)
Chloride: 106 mEq/L (ref 96–112)
GFR, EST AFRICAN AMERICAN: 12 mL/min — AB (ref >90)
GFR, EST NON AFRICAN AMERICAN: 10 mL/min — AB (ref >90)
GLUCOSE: 120 mg/dL — AB (ref 70–99)
Potassium: 4.2 mEq/L (ref 3.7–5.3)
SODIUM: 133 meq/L — AB (ref 137–147)
TOTAL PROTEIN: 6.7 g/dL (ref 6.0–8.3)
Total Bilirubin: 1 mg/dL (ref 0.3–1.2)

## 2014-04-19 LAB — URINALYSIS, ROUTINE W REFLEX MICROSCOPIC
Bilirubin Urine: NEGATIVE
Glucose, UA: NEGATIVE mg/dL
Ketones, ur: NEGATIVE mg/dL
LEUKOCYTES UA: NEGATIVE
NITRITE: NEGATIVE
PH: 5.5 (ref 5.0–8.0)
Protein, ur: NEGATIVE mg/dL
UROBILINOGEN UA: 0.2 mg/dL (ref 0.0–1.0)

## 2014-04-19 LAB — URINE MICROSCOPIC-ADD ON

## 2014-04-19 LAB — MRSA PCR SCREENING: MRSA by PCR: NEGATIVE

## 2014-04-19 LAB — LIPASE, BLOOD: LIPASE: 226 U/L — AB (ref 11–59)

## 2014-04-19 MED ORDER — BIOTENE DRY MOUTH MT LIQD
15.0000 mL | Freq: Two times a day (BID) | OROMUCOSAL | Status: DC
  Administered 2014-04-19 – 2014-04-21 (×4): 15 mL via OROMUCOSAL

## 2014-04-19 MED ORDER — SODIUM CHLORIDE 0.9 % IJ SOLN
3.0000 mL | Freq: Two times a day (BID) | INTRAMUSCULAR | Status: DC
  Administered 2014-04-19 – 2014-04-24 (×6): 3 mL via INTRAVENOUS

## 2014-04-19 MED ORDER — ALLOPURINOL 300 MG PO TABS
300.0000 mg | ORAL_TABLET | Freq: Every day | ORAL | Status: DC
  Administered 2014-04-19 – 2014-04-24 (×6): 300 mg via ORAL
  Filled 2014-04-19 (×6): qty 1

## 2014-04-19 MED ORDER — ACETAMINOPHEN 500 MG PO TABS
500.0000 mg | ORAL_TABLET | Freq: Four times a day (QID) | ORAL | Status: DC | PRN
  Administered 2014-04-19 – 2014-04-24 (×3): 500 mg via ORAL
  Filled 2014-04-19 (×3): qty 1

## 2014-04-19 MED ORDER — TEMAZEPAM 15 MG PO CAPS
30.0000 mg | ORAL_CAPSULE | Freq: Every evening | ORAL | Status: DC | PRN
  Administered 2014-04-19 – 2014-04-20 (×2): 15 mg via ORAL
  Administered 2014-04-21 – 2014-04-22 (×2): 30 mg via ORAL
  Filled 2014-04-19 (×4): qty 2

## 2014-04-19 MED ORDER — THIOTHIXENE 2 MG PO CAPS
ORAL_CAPSULE | ORAL | Status: AC
  Filled 2014-04-19: qty 1

## 2014-04-19 MED ORDER — SODIUM CHLORIDE 0.9 % IV SOLN: INTRAVENOUS | Status: DC

## 2014-04-19 MED ORDER — ONDANSETRON HCL 4 MG PO TABS: 4.0000 mg | ORAL_TABLET | Freq: Four times a day (QID) | ORAL | Status: DC | PRN

## 2014-04-19 MED ORDER — THIOTHIXENE 2 MG PO CAPS
2.0000 mg | ORAL_CAPSULE | Freq: Every day | ORAL | Status: DC
  Administered 2014-04-19 – 2014-04-24 (×6): 2 mg via ORAL
  Filled 2014-04-19 (×8): qty 1

## 2014-04-19 MED ORDER — SODIUM CHLORIDE 0.9 % IV BOLUS (SEPSIS)
1000.0000 mL | Freq: Once | INTRAVENOUS | Status: AC
  Administered 2014-04-19: 1000 mL via INTRAVENOUS

## 2014-04-19 MED ORDER — PNEUMOCOCCAL VAC POLYVALENT 25 MCG/0.5ML IJ INJ
0.5000 mL | INJECTION | INTRAMUSCULAR | Status: DC
  Filled 2014-04-19: qty 0.5

## 2014-04-19 MED ORDER — SODIUM CHLORIDE 0.9 % IV SOLN
INTRAVENOUS | Status: DC
  Administered 2014-04-19 – 2014-04-20 (×3): 1000 mL via INTRAVENOUS
  Administered 2014-04-21: 17:00:00 via INTRAVENOUS
  Administered 2014-04-21: 1000 mL via INTRAVENOUS
  Administered 2014-04-21 – 2014-04-24 (×7): via INTRAVENOUS

## 2014-04-19 MED ORDER — HEPARIN SODIUM (PORCINE) 5000 UNIT/ML IJ SOLN
5000.0000 [IU] | Freq: Three times a day (TID) | INTRAMUSCULAR | Status: DC
  Administered 2014-04-19 – 2014-04-24 (×15): 5000 [IU] via SUBCUTANEOUS
  Filled 2014-04-19 (×17): qty 1

## 2014-04-19 MED ORDER — ONDANSETRON HCL 4 MG/2ML IJ SOLN: 4.0000 mg | Freq: Four times a day (QID) | INTRAMUSCULAR | Status: DC | PRN

## 2014-04-19 MED ORDER — ALBUTEROL SULFATE (2.5 MG/3ML) 0.083% IN NEBU
2.5000 mg | INHALATION_SOLUTION | Freq: Two times a day (BID) | RESPIRATORY_TRACT | Status: DC
  Filled 2014-04-19: qty 3

## 2014-04-19 NOTE — ED Notes (Signed)
Pt denies hurting anywhere at this time, pt denies any head injury or LOC at time of fall

## 2014-04-19 NOTE — H&P (Signed)
[facility] History and Physical  Samantha Conley [id number] DOB: 03/29/1951 DOA: 04/19/2014  Referring physician: ER. PCP: Alonza Bogus, MD   Chief Complaint: Lightheaded, and dizzy/fall.  HPI: Samantha Conley is a 63 y.o. female  This is a very pleasant 63 year old lady who has a history of bipolar disorder and has been on lithium medication for decades and he presents now with lightheadedness associated with a fall at home. She did not lose consciousness. She had seen her primary care physician, Dr. Luan Pulling, a few days ago and the lithium level was measured and was extremely high. He recommended that she discontinue the lithium for the time being and encourage oral fluids. Unfortunately, she has gotten worse since this time and now presents to the hospital. In the emergency room, she was found to be hypotensive and in acute renal failure. She is now being admitted for further management.   Review of Systems:  Constitutional:  No weight loss, night sweats, Fevers, chills, fatigue.  HEENT:  No headaches, Difficulty swallowing,Tooth/dental problems,Sore throat,  No sneezing, itching, ear ache, nasal congestion, post nasal drip,  Cardio-vascular:  No chest pain, Orthopnea, PND, swelling in lower extremities, anasarca,  palpitations  GI:  No heartburn, indigestion, abdominal pain, nausea, vomiting, diarrhea, change in bowel habits, loss of appetite  Resp:  No shortness of breath with exertion or at rest. No excess mucus, no productive cough, No non-productive cough, No coughing up of blood.No change in color of mucus.No wheezing.No chest wall deformity  Skin:  no rash or lesions.  GU:  no dysuria, change in color of urine, no urgency or frequency. No flank pain.  Musculoskeletal:  No joint pain or swelling. No decreased range of motion. No back pain.  Psych:  No change in mood or affect. No depression or anxiety. No memory loss.   Past Medical History  Diagnosis Date   . Hypertension     since 2007  . Gout   . Asthma   . Bipolar disorder     patient denies  . Insomnia    Past Surgical History  Procedure Laterality Date  . Chemical imbalance    . Cholecystectomy    . Total abdominal hysterectomy    . Tonsillectomy    . Nasal sinus surgery     Social History:  reports that she has never smoked. She does not have any smokeless tobacco history on file. She reports that she does not drink alcohol or use illicit drugs.  Allergies  Allergen Reactions  . Codeine Other (See Comments)    Makes states it makes her "spacey"  . Doxycycline     Severe diarrhea  . Penicillins Other (See Comments)    unknown  . Promethazine Other (See Comments)    Patient states she is not allergic but it makes her  "spacey and not in control"    Family History  Problem Relation Age of Onset  . CAD Mother   . CAD Father   . CVA Mother   . Parkinsonism Father      Prior to Admission medications   Medication Sig Start Date End Date Taking? Authorizing Provider  acetaminophen (TYLENOL) 500 MG tablet Take 500 mg by mouth every 6 (six) hours as needed for mild pain.   Yes Historical Provider, MD  albuterol (PROVENTIL HFA;VENTOLIN HFA) 108 (90 BASE) MCG/ACT inhaler Inhale 2 puffs into the lungs 2 (two) times daily.   Yes Historical Provider, MD  allopurinol (ZYLOPRIM) 300 MG tablet Take 300  mg by mouth at bedtime.    Yes Historical Provider, MD  amLODipine (NORVASC) 5 MG tablet Take 5 mg by mouth at bedtime.    Yes Historical Provider, MD  diphenhydrAMINE (SOMINEX) 25 MG tablet Take 25 mg by mouth at bedtime as needed for sleep.    Yes Historical Provider, MD  lisinopril (PRINIVIL,ZESTRIL) 10 MG tablet Take 10 mg by mouth at bedtime.    Yes Historical Provider, MD  lithium carbonate (ESKALITH) 450 MG CR tablet Take 450 mg by mouth at bedtime.   Yes Historical Provider, MD  temazepam (RESTORIL) 30 MG capsule Take 30 mg by mouth at bedtime as needed for sleep.   Yes  Historical Provider, MD  thiothixene (NAVANE) 2 MG capsule Take 2 mg by mouth at bedtime.    Yes Historical Provider, MD   Physical Exam: Filed Vitals:   04/19/14 1740  BP:   Pulse: 76  Temp:   Resp: 18    BP 90/58  Pulse 76  Temp(Src) 97.2 F (36.2 C) (Oral)  Resp 18  Ht 5' 4.5" (1.638 m)  Wt 68.04 kg (150 lb)  BMI 25.36 kg/m2  SpO2 97%  General:  Appears calm and comfortable. Despite the soft blood pressure, she is mentating quite normally and is sitting up in bed. Eyes: PERRL, normal lids, irises & conjunctiva ENT: grossly normal hearing, lips & tongue Neck: no LAD, masses or thyromegaly Cardiovascular: RRR, no m/r/g. No LE edema. Telemetry: SR, no arrhythmias  Respiratory: CTA bilaterally, no w/r/r. Normal respiratory effort. Abdomen: soft, ntnd Skin: no rash or induration seen on limited exam Musculoskeletal: grossly normal tone BUE/BLE Psychiatric: grossly normal mood and affect, speech fluent and appropriate Neurologic: grossly non-focal.          Labs on Admission:  Basic Metabolic Panel:  Recent Labs Lab 04/19/14 1608  NA 133*  K 4.2  CL 106  CO2 15*  GLUCOSE 120*  BUN 50*  CREATININE 4.36*  CALCIUM 10.7*   Liver Function Tests:  Recent Labs Lab 04/19/14 1608  AST 14  ALT 10  ALKPHOS 157*  BILITOT 1.0  PROT 6.7  ALBUMIN 4.1    Recent Labs Lab 04/19/14 1608  LIPASE 226*    CBC:  Recent Labs Lab 04/19/14 1608  WBC 10.3  NEUTROABS 8.4*  HGB 10.5*  HCT 32.2*  MCV 90.2  PLT 317      Radiological Exams on Admission: No results found.  EKG: Independently reviewed. The electrocardiogram report has been reported as atrial fibrillation but it appears to be in sinus rhythm. No acute ST-T wave changes.  Assessment/Plan   1. Acute renal failure. 2. Hypotension and dehydration causing #1. 3. Lithium toxicity causing 1 and 2. 4. History of hypertension, currently hypotensive. 5. Bipolar disorder, on chronic lithium  therapy. 6. Hypercalcemia secondary to lithium toxicity and acute renal failure.  Plan: 1. Admit to step down unit. 2. Aggressive intravenous fluid resuscitation. 3. Monitor renal function closely. 4. Monitor lithium levels closely.  Further recommendations will depend on patient's hospital progress.   Code Status: Full code.   Family Communication: I discussed the plan with the patient at the bedside.   Disposition Plan: Home when medically stable.   Time spent: 60 minutes.  [facility] Pager [phone].

## 2014-04-19 NOTE — Progress Notes (Signed)
PT refused neb as she is afraid will cause Tachycardia. Lungs appear clear.

## 2014-04-19 NOTE — ED Notes (Signed)
Pt states that she is currently being tx for elevated lithium level by Dr Luan Pulling, had blood work performed last week and was told that her lithium level was "twice as high as normal" pt was advised to stop taking the lithium for the past 4 days and have the level drawn again tomorrow, presents to er today for generalized weakness, states that she was coming down her back hall and fell against the steps landing on the carpet, denies any injury from fall,

## 2014-04-19 NOTE — ED Provider Notes (Addendum)
CSN: [id number]     Arrival date & time 04/19/14  1445 History  This chart was scribed for Carmin Muskrat, MD by Rolanda Lundborg, ED Scribe. This patient was seen in room APA06/APA06 and the patient's care was started at 3:19 PM.    Chief Complaint  Patient presents with  . Fall   The history is provided by the patient. No language interpreter was used.   HPI Comments: ZAKARA BEAIRD is a 63 y.o. female with a h/o bipolar disorder who presents to the Emergency Department complaining of a fall. Pt states last Thursday she went to PCP [facility] and her lithium level had doubled. She staets she has not felt right since being hospitalized for dehydration last month. Luan Pulling told her to cut out lithium til Monday.   She has felt generalized weakness and shakiness all-over stumblig nervous and shaky for 4 days since stopped taking lithium. She states her fine motor skills are decreased. She reports difficulty speaking and memory problems.   She has been on lithium since the 1980s. She has had one similar episode to this around 10 years ago.  Pt reports a fall secondary to weakness. She was unable to get up for a long time. Eventually she made it to the phone. She denies LOC, neck pain, hip pain.   Past Medical History  Diagnosis Date  . Hypertension     since 2007  . Gout   . Asthma   . Bipolar disorder     patient denies  . Insomnia    Past Surgical History  Procedure Laterality Date  . Chemical imbalance    . Cholecystectomy    . Total abdominal hysterectomy    . Tonsillectomy    . Nasal sinus surgery     Family History  Problem Relation Age of Onset  . CAD Mother   . CAD Father   . CVA Mother   . Parkinsonism Father    History  Substance Use Topics  . Smoking status: Never Smoker   . Smokeless tobacco: Not on file  . Alcohol Use: No   OB History   Grav Para Term Preterm Abortions TAB SAB Ect Mult Living                 Review of Systems  Constitutional:       Per  HPI, otherwise negative  HENT:       Per HPI, otherwise negative  Respiratory:       Per HPI, otherwise negative  Cardiovascular:       Per HPI, otherwise negative  Gastrointestinal: Negative for vomiting.  Endocrine:       Negative aside from HPI  Genitourinary:       Neg aside from HPI   Musculoskeletal:       Per HPI, otherwise negative  Skin: Negative.   Neurological: Negative for syncope.      Allergies  Codeine; Doxycycline; Penicillins; and Promethazine  Home Medications   Prior to Admission medications   Medication Sig Start Date End Date Taking? Authorizing Provider  acetaminophen (TYLENOL) 500 MG tablet Take 500 mg by mouth every 6 (six) hours as needed for mild pain.    Historical Provider, MD  albuterol (PROVENTIL HFA;VENTOLIN HFA) 108 (90 BASE) MCG/ACT inhaler Inhale 2 puffs into the lungs 2 (two) times daily.    Historical Provider, MD  allopurinol (ZYLOPRIM) 300 MG tablet Take 300 mg by mouth daily.    Historical Provider, MD  amLODipine (NORVASC) 5  MG tablet Take 5 mg by mouth daily.    Historical Provider, MD  diphenhydrAMINE (SOMINEX) 25 MG tablet Take 25 mg by mouth at bedtime as needed for sleep.     Historical Provider, MD  lisinopril (PRINIVIL,ZESTRIL) 10 MG tablet Take 10 mg by mouth daily.    Historical Provider, MD  lithium carbonate (ESKALITH) 450 MG CR tablet Take 450 mg by mouth at bedtime.    Historical Provider, MD  sodium bicarbonate 650 MG tablet Take 1 tablet (650 mg total) by mouth 4 (four) times daily. 03/15/14   Alonza Bogus, MD  temazepam (RESTORIL) 30 MG capsule Take 30 mg by mouth at bedtime as needed for sleep.    Historical Provider, MD  thiothixene (NAVANE) 2 MG capsule Take 2 mg by mouth daily.     Historical Provider, MD   BP 75/41  Pulse 70  Temp(Src) 97.2 F (36.2 C) (Oral)  Resp 20  Ht 5' 4.5" (1.638 m)  Wt 150 lb (68.04 kg)  BMI 25.36 kg/m2  SpO2 100% Physical Exam  Nursing note and vitals reviewed. Constitutional:  She is oriented to person, place, and time. She appears listless. She appears ill.  HENT:  Head: Normocephalic and atraumatic.  Eyes: Conjunctivae and EOM are normal.  Cardiovascular: Normal rate and regular rhythm.   Pulmonary/Chest: Effort normal and breath sounds normal. No stridor. No respiratory distress.  Abdominal: Soft. She exhibits no distension. There is no tenderness.  Musculoskeletal: She exhibits no edema.  Neurological: She is oriented to person, place, and time. She appears listless. She displays tremor. No cranial nerve deficit.  Good strength BLE no tremor.  Baseline tremor in bilateral hands. No pronator drift  Skin: Skin is warm and dry.  Psychiatric: She has a normal mood and affect.    ED Course  Procedures (including critical care time) Medications - No data to display  DIAGNOSTIC STUDIES: Oxygen Saturation is 100% on RA, normal by my interpretation.    COORDINATION OF CARE:   On arrival the patient was hypotensive (75/41)  Empiric IVF resuscitation started.   3:27 PM- Discussed treatment plan with pt. Pt agrees to plan.    Labs Review Labs Reviewed  CBC WITH DIFFERENTIAL - Abnormal; Notable for the following:    RBC 3.57 (*)    Hemoglobin 10.5 (*)    HCT 32.2 (*)    Neutrophils Relative % 82 (*)    Neutro Abs 8.4 (*)    All other components within normal limits  COMPREHENSIVE METABOLIC PANEL - Abnormal; Notable for the following:    Sodium 133 (*)    CO2 15 (*)    Glucose, Bld 120 (*)    BUN 50 (*)    Creatinine, Ser 4.36 (*)    Calcium 10.7 (*)    Alkaline Phosphatase 157 (*)    GFR calc non Af Amer 10 (*)    GFR calc Af Amer 12 (*)    All other components within normal limits  LIPASE, BLOOD - Abnormal; Notable for the following:    Lipase 226 (*)    All other components within normal limits  BLOOD GAS, VENOUS - Abnormal; Notable for the following:    pH, Ven 7.310 (*)    pCO2, Ven 29.8 (*)    Bicarbonate 14.6 (*)    Acid-Base Excess  10.4 (*)    All other components within normal limits  URINALYSIS, ROUTINE W REFLEX MICROSCOPIC  LITHIUM LEVEL    Labs notable for acidosis and  acute renal failure.   On re-exam the patient appears in a similar, listless state. We discussed all findings, including ARF.  Cardiac: 70sr, nml  O2- 99%RA, NML  EKG has sinus rhythm, rate 75, low voltage, long QT, artifact, abnormal  MDM    I personally performed the services described in this documentation, which was scribed in my presence. The recorded information has been reviewed and is accurate.   This patient with a long history of lithium use for depression, bipolar disorder presents with weakness. Notably, the patient recently stopped her lithium, and subsequently has developed her weakness, tremor, and has had a fall.  The fall did not result in notable deformity, or any area of pain. On arrival the patient is hypotensive, listless.  She improved somewhat with IV fluids.  Given the acute renal failure she required admission for further evaluation and management.  In the emergency department she was resuscitated with IV fluids given the additional hypotension, and upon diagnosis of acute renal failure with hypotension she continued to require IV resuscitation.  CRITICAL CARE Performed by: Carmin Muskrat Total critical care time: 35 Critical care time was exclusive of separately billable procedures and treating other patients. Critical care was necessary to treat or prevent imminent or life-threatening deterioration. Critical care was time spent personally by me on the following activities: development of treatment plan with patient and/or surrogate as well as nursing, discussions with consultants, evaluation of patient's response to treatment, examination of patient, obtaining history from patient or surrogate, ordering and performing treatments and interventions, ordering and review of laboratory studies, ordering and review of  radiographic studies, pulse oximetry and re-evaluation of patient's condition.    Carmin Muskrat, MD 04/19/14 [place], MD 04/19/14 [phone]

## 2014-04-19 NOTE — ED Notes (Signed)
Pt also states that she has had diarrhea all day yesterday and was recently admitted to hospital for dehydration,

## 2014-04-19 NOTE — ED Notes (Signed)
Attempted to call report to ICU, nurse states she was busy and would call me back

## 2014-04-20 LAB — CBC
HEMATOCRIT: 29.2 % — AB (ref 36.0–46.0)
Hemoglobin: 9.5 g/dL — ABNORMAL LOW (ref 12.0–15.0)
MCH: 29.5 pg (ref 26.0–34.0)
MCHC: 32.5 g/dL (ref 30.0–36.0)
MCV: 90.7 fL (ref 78.0–100.0)
Platelets: 290 10*3/uL (ref 150–400)
RBC: 3.22 MIL/uL — AB (ref 3.87–5.11)
RDW: 14.7 % (ref 11.5–15.5)
WBC: 8.3 10*3/uL (ref 4.0–10.5)

## 2014-04-20 LAB — COMPREHENSIVE METABOLIC PANEL
ALT: 11 U/L (ref 0–35)
AST: 14 U/L (ref 0–37)
Albumin: 3.4 g/dL — ABNORMAL LOW (ref 3.5–5.2)
Alkaline Phosphatase: 151 U/L — ABNORMAL HIGH (ref 39–117)
BUN: 42 mg/dL — AB (ref 6–23)
CHLORIDE: 116 meq/L — AB (ref 96–112)
CO2: 15 mEq/L — ABNORMAL LOW (ref 19–32)
CREATININE: 3.11 mg/dL — AB (ref 0.50–1.10)
Calcium: 10 mg/dL (ref 8.4–10.5)
GFR calc Af Amer: 18 mL/min — ABNORMAL LOW (ref >90)
GFR calc non Af Amer: 15 mL/min — ABNORMAL LOW (ref >90)
Glucose, Bld: 114 mg/dL — ABNORMAL HIGH (ref 70–99)
POTASSIUM: 4.2 meq/L (ref 3.7–5.3)
Sodium: 142 mEq/L (ref 137–147)
TOTAL PROTEIN: 5.7 g/dL — AB (ref 6.0–8.3)
Total Bilirubin: 0.7 mg/dL (ref 0.3–1.2)

## 2014-04-20 LAB — LITHIUM LEVEL
LITHIUM LVL: 2.53 meq/L — AB (ref 0.80–1.40)
LITHIUM LVL: 2.09 meq/L — AB (ref 0.80–1.40)

## 2014-04-20 LAB — TSH: TSH: 2.15 u[IU]/mL (ref 0.350–4.500)

## 2014-04-20 MED ORDER — ALBUTEROL SULFATE (2.5 MG/3ML) 0.083% IN NEBU: 2.5000 mg | INHALATION_SOLUTION | Freq: Four times a day (QID) | RESPIRATORY_TRACT | Status: DC | PRN

## 2014-04-20 MED ORDER — COLCHICINE 0.6 MG PO TABS: 0.6000 mg | ORAL_TABLET | Freq: Two times a day (BID) | ORAL | Status: DC | PRN

## 2014-04-20 NOTE — Progress Notes (Signed)
Subjective: She was admitted with lithium toxicity. She also had what appeared to be some acute renal failure. She had been seen in my office last week and we discussed hospitalization which not want to do. She said that she was not drinking enough and she thought she would be able to drink enough fluid to keep from getting dehydrated and end up in the hospital but she was not able to do that. She became weaker and fell. This morning she says she feels somewhat better.  Objective: Vital signs in last 24 hours: Temp:  [97.2 F (36.2 C)-99.1 F (37.3 C)] 97.9 F (36.6 C) (05/18 0802) Pulse Rate:  [70-83] 83 (05/17 1836) Resp:  [0-27] 19 (05/18 0600) BP: (75-127)/(34-94) 85/34 mmHg (05/18 0600) SpO2:  [94 %-100 %] 95 % (05/18 0430) Weight:  [68.04 kg (150 lb)-73.6 kg (162 lb 4.1 oz)] 73.5 kg (162 lb 0.6 oz) (05/18 0500) Weight change:  Last BM Date: 04/18/14  Intake/Output from previous day: 05/17 0701 - 05/18 0700 In: 1552.5 [P.O.:120; I.V.:1432.5] Out: 1500 [Urine:1500]  PHYSICAL EXAM General appearance: alert, cooperative and mild distress Resp: clear to auscultation bilaterally Cardio: regular rate and rhythm, S1, S2 normal, no murmur, click, rub or gallop GI: soft, non-tender; bowel sounds normal; no masses,  no organomegaly Extremities: extremities normal, atraumatic, no cyanosis or edema  Lab Results:    Basic Metabolic Panel:  Recent Labs  04/19/14 1608 04/20/14 0410  NA 133* 142  K 4.2 4.2  CL 106 116*  CO2 15* 15*  GLUCOSE 120* 114*  BUN 50* 42*  CREATININE 4.36* 3.11*  CALCIUM 10.7* 10.0   Liver Function Tests:  Recent Labs  04/19/14 1608 04/20/14 0410  AST 14 14  ALT 10 11  ALKPHOS 157* 151*  BILITOT 1.0 0.7  PROT 6.7 5.7*  ALBUMIN 4.1 3.4*    Recent Labs  04/19/14 1608  LIPASE 226*   No results found for this basename: AMMONIA,  in the last 72 hours CBC:  Recent Labs  04/19/14 1608 04/20/14 0410  WBC 10.3 8.3  NEUTROABS 8.4*  --    HGB 10.5* 9.5*  HCT 32.2* 29.2*  MCV 90.2 90.7  PLT 317 290   Cardiac Enzymes: No results found for this basename: CKTOTAL, CKMB, CKMBINDEX, TROPONINI,  in the last 72 hours BNP: No results found for this basename: PROBNP,  in the last 72 hours D-Dimer: No results found for this basename: DDIMER,  in the last 72 hours CBG: No results found for this basename: GLUCAP,  in the last 72 hours Hemoglobin A1C: No results found for this basename: HGBA1C,  in the last 72 hours Fasting Lipid Panel: No results found for this basename: CHOL, HDL, LDLCALC, TRIG, CHOLHDL, LDLDIRECT,  in the last 72 hours Thyroid Function Tests: No results found for this basename: TSH, T4TOTAL, FREET4, T3FREE, THYROIDAB,  in the last 72 hours Anemia Panel: No results found for this basename: VITAMINB12, FOLATE, FERRITIN, TIBC, IRON, RETICCTPCT,  in the last 72 hours Coagulation: No results found for this basename: LABPROT, INR,  in the last 72 hours Urine Drug Screen: Drugs of Abuse  No results found for this basename: labopia, cocainscrnur, labbenz, amphetmu, thcu, labbarb    Alcohol Level: No results found for this basename: ETH,  in the last 72 hours Urinalysis:  Recent Labs  04/19/14 2115  COLORURINE YELLOW  LABSPEC <1.005*  PHURINE 5.5  GLUCOSEU NEGATIVE  HGBUR TRACE*  [place]  0.2  NITRITE NEGATIVE  LEUKOCYTESUR NEGATIVE   Misc. Labs:  ABGS  Recent Labs  04/19/14 1620  TCO2 13.7  HCO3 14.6*   CULTURES Recent Results (from the past 240 hour(s))  MRSA PCR SCREENING     Status: None   Collection Time    04/19/14  9:15 PM      Result Value Ref Range Status   MRSA by PCR NEGATIVE  NEGATIVE Final   Comment:            The GeneXpert MRSA Assay (FDA     approved for NASAL specimens     only), is one component of a     comprehensive MRSA colonization     surveillance program. It is not     intended to diagnose MRSA      infection nor to guide or     monitor treatment for     MRSA infections.   Studies/Results: No results found.  Medications:  Prior to Admission:  Prescriptions prior to admission  Medication Sig Dispense Refill  . acetaminophen (TYLENOL) 500 MG tablet Take 500 mg by mouth every 6 (six) hours as needed for mild pain.      Marland Kitchen albuterol (PROVENTIL HFA;VENTOLIN HFA) 108 (90 BASE) MCG/ACT inhaler Inhale 2 puffs into the lungs 2 (two) times daily.      Marland Kitchen allopurinol (ZYLOPRIM) 300 MG tablet Take 300 mg by mouth at bedtime.       Marland Kitchen amLODipine (NORVASC) 5 MG tablet Take 5 mg by mouth at bedtime.       . diphenhydrAMINE (SOMINEX) 25 MG tablet Take 25 mg by mouth at bedtime as needed for sleep.       Marland Kitchen lisinopril (PRINIVIL,ZESTRIL) 10 MG tablet Take 10 mg by mouth at bedtime.       Marland Kitchen lithium carbonate (ESKALITH) 450 MG CR tablet Take 450 mg by mouth at bedtime.      . temazepam (RESTORIL) 30 MG capsule Take 30 mg by mouth at bedtime as needed for sleep.      Marland Kitchen thiothixene (NAVANE) 2 MG capsule Take 2 mg by mouth at bedtime.        Scheduled: . allopurinol  300 mg Oral QHS  . antiseptic oral rinse  15 mL Mouth Rinse BID  . heparin  5,000 Units Subcutaneous 3 times per day  . pneumococcal 23 valent vaccine  0.5 mL Intramuscular Tomorrow-1000  . sodium chloride  3 mL Intravenous Q12H  . thiothixene  2 mg Oral QHS   Continuous: . sodium chloride 150 mL/hr at 04/20/14 0600   [id number], albuterol, ondansetron (ZOFRAN) IV, ondansetron, temazepam  Assesment: She was admitted with lithium toxicity and acute renal failure. She has been on lithium for many years without incident but has been having trouble for the last month or so. She had Salmonella in her stool had severe diarrhea and that seemed to have resolved but she's not been able to see him to get back to baseline since that episode. Active Problems:   Hypertension   Bipolar disease, chronic   Acute renal failure    Hypotension   Bipolar disorder, unspecified   ARF (acute renal failure)    Plan: Continue IV fluids. Recheck lithium level and basic metabolic profile in the morning    LOS: 1 day   Alonza Bogus 04/20/2014, 8:22 AM

## 2014-04-20 NOTE — Progress Notes (Signed)
INITIAL NUTRITION ASSESSMENT  DOCUMENTATION CODES Per approved criteria  -Not Applicable   INTERVENTION: Heart Healthy diet  NUTRITION DIAGNOSIS: Inadequate oral intake related to decreased appetite  as evidenced by dehydration on admission .   Goal: Pt to meet >/= 90% of their estimated nutrition needs     Monitor: Po intake, labs and wt trends    Reason for Assessment: Malnutrition Screen Score =  3  63 y.o. female  Admitting Dx: Lithium toxicity   ASSESSMENT: Dehydrated on admission. Pt provided limited hx. Describes home diet a regular. Hx of gout and says she limits liver intake. Weight loss of 4%,7# in 35 days which is not significant. No physical signs of malnutrition observed.  Height: Ht Readings from Last 1 Encounters:  04/19/14 5\' 4"  (1.626 m)    Weight: Wt Readings from Last 1 Encounters:  04/20/14 162 lb 0.6 oz (73.5 kg)    Ideal Body Weight: 120# (54.5 kg)  % Ideal Body Weight: 135%  Wt Readings from Last 10 Encounters:  04/20/14 162 lb 0.6 oz (73.5 kg)  03/15/14 168 lb 9.6 oz (76.476 kg)  12/14/11 176 lb (79.833 kg)    Usual Body Weight: 165-170#  % Usual Body Weight: 96%  BMI:  Body mass index is 27.8 kg/(m^2). overweight  Estimated Nutritional Needs: Kcal: 1375-1650 Protein: 70-80 gr Fluid: 1.4-1.7 liters daily  Skin: intact  Diet Order: Cardiac  EDUCATION NEEDS: -Education needs addressed   Intake/Output Summary (Last 24 hours) at 04/20/14 1025 Last data filed at 04/20/14 0641  Gross per 24 hour  Intake 1552.5 ml  Output   1500 ml  Net   52.5 ml    Last BM: 04/18/14  Labs:   Recent Labs Lab 04/19/14 1608 04/20/14 0410  NA 133* 142  K 4.2 4.2  CL 106 116*  CO2 15* 15*  BUN 50* 42*  CREATININE 4.36* 3.11*  CALCIUM 10.7* 10.0  GLUCOSE 120* 114*    CBG (last 3)  No results found for this basename: GLUCAP,  in the last 72 hours  Scheduled Meds: . allopurinol  300 mg Oral QHS  . antiseptic oral rinse  15 mL  Mouth Rinse BID  . heparin  5,000 Units Subcutaneous 3 times per day  . pneumococcal 23 valent vaccine  0.5 mL Intramuscular Tomorrow-1000  . sodium chloride  3 mL Intravenous Q12H  . thiothixene  2 mg Oral QHS    Continuous Infusions: . sodium chloride 150 mL/hr at 04/20/14 0600    Past Medical History  Diagnosis Date  . Hypertension     since 2007  . Gout   . Asthma   . Bipolar disorder     patient denies  . Insomnia     Past Surgical History  Procedure Laterality Date  . Chemical imbalance    . Cholecystectomy    . Total abdominal hysterectomy    . Tonsillectomy    . Nasal sinus surgery      Colman Cater MS,RD,CSG,LDN Office: [phone] Pager: [phone]

## 2014-04-20 NOTE — Care Management Note (Addendum)
    Page 1 of 1   04/24/2014     12:09:12 PM CARE MANAGEMENT NOTE 04/24/2014  Patient:  Samantha Conley, Samantha Conley   Account Number:  [id number]  Date Initiated:  04/20/2014  Documentation initiated by:  Theophilus Kinds  Subjective/Objective Assessment:   Pt admitted from home with acute renal failure. Pt lives alone and will return home at discharge. Pt has a cane for prn use. Pt stated that she is fairly independent with AD:L's.     Action/Plan:   No CM needs noted.   Anticipated DC Date:  04/23/2014   Anticipated DC Plan:  [place]  CM consult      Choice offered to / List presented to:             Status of service:  Completed, signed off Medicare Important Message given?   (If response is "NO", the following Medicare IM given date fields will be blank) Date Medicare IM given:   Date Additional Medicare IM given:    Discharge Disposition:  HOME/SELF CARE  Per UR Regulation:    If discussed at Long Length of Stay Meetings, dates discussed:    Comments:  04/24/14 [place], RN BSN CM Pt for discharge on 04/25/14. Pt denies any need for any HH at discharge.  04/20/14 [place], RN BSN CM

## 2014-04-20 NOTE — Progress Notes (Signed)
UR chart review completed.  

## 2014-04-20 NOTE — Plan of Care (Signed)
Problem: Consults Goal: ARF/New Onset CRF Patient Education See Patient Education Module for education specifics. Medication induced acute renal  Failure, lithium toxicity, lithium level 2.53. Goal: Nutrition Consult-if indicated Outcome: Completed/Met Date Met:  04/20/14 Patient has a good appetite  Problem: Phase I Progression Outcomes Goal: Dyspnea controlled at rest Outcome: Completed/Met Date Met:  04/20/14 No SOB since admission Goal: Pain controlled with appropriate interventions Outcome: Progressing Patient has a headache since admission. Goal: OOB as tolerated unless otherwise ordered Outcome: Progressing Patient uses walker to ambulate at home Goal: Initial discharge plan identified Outcome: Progressing Lives at home with oldest son Samantha Conley

## 2014-04-20 NOTE — Progress Notes (Signed)
Patient with headache rated a 7, continual since onset of shift, tylenol 500mg  po given no relief, cold cloths to head, helped temporarily, due to renal impairment, notified MD for additional pain management, meds ordered but patient did not want to take it.

## 2014-04-20 NOTE — Progress Notes (Signed)
PHARMACIST - PHYSICIAN COMMUNICATION DR:   Luan Pulling CONCERNING:  Lithium Toxicity  RECOMMENDATION: In addition to decrease in fluid intake, may want to consider increased lithium levels could be due to change in BP medication.  Reduction in Lithium dose & closer lithium level monitoring may be warrranted.    DESCRIPTION: Patient admitted with lithium toxicity/ acute renal failure.  Medications reviewed for possible interactions/ causes of toxicity.   Patient states her symptoms (stomach pains, N/V/D, weakness, & dizziness) started late March/early April & these were attributed to parasite infection.   Symptoms also indicative of lithium toxicity. Patient's BP medications were changed around time of sx onset (late March) from Benedict (amlodipine/olmesartan) to amlodipine & lisinopril due to patient's request for more affordable medication.  ACEI (and ARBs) are known to interact with lithium and increase lithium levels.  Literature suggests up to a 4-fold increase in lithium levels- with most toxicity seen in female patients >50yo or those experiencing decline in renal function after starting ACEI.   It is recommended to check lithium levels for 6 weeks after starting, stopping, dose change, or changing to a different agent in these classes.    Netta Cedars, PharmD, BCPS 04/20/2014@4 :51 PM

## 2014-04-21 LAB — BASIC METABOLIC PANEL
BUN: 25 mg/dL — ABNORMAL HIGH (ref 6–23)
CALCIUM: 10.4 mg/dL (ref 8.4–10.5)
CO2: 12 meq/L — AB (ref 19–32)
CREATININE: 2 mg/dL — AB (ref 0.50–1.10)
Chloride: 119 mEq/L — ABNORMAL HIGH (ref 96–112)
GFR calc Af Amer: 30 mL/min — ABNORMAL LOW (ref >90)
GFR, EST NON AFRICAN AMERICAN: 26 mL/min — AB (ref >90)
GLUCOSE: 117 mg/dL — AB (ref 70–99)
Potassium: 4.1 mEq/L (ref 3.7–5.3)
Sodium: 144 mEq/L (ref 137–147)

## 2014-04-21 LAB — LITHIUM LEVEL: Lithium Lvl: 1.59 mEq/L (ref 0.80–1.40)

## 2014-04-21 MED ORDER — SODIUM BICARBONATE 650 MG PO TABS
650.0000 mg | ORAL_TABLET | Freq: Three times a day (TID) | ORAL | Status: DC
  Administered 2014-04-21 – 2014-04-25 (×13): 650 mg via ORAL
  Filled 2014-04-21 (×13): qty 1

## 2014-04-21 NOTE — Progress Notes (Signed)
Subjective: She was confused last night. At baseline she has mental illness that has been characterized as bipolar disease but the psychiatrist that she had been seeing at that time told me that he was not sure what the correct diagnosis was. However she had been pretty well controlled on lithium until recently. She has also been on one of the older antipsychotic medications. This had been unavailable for a brief period and she was put on Resperidol during that time and did not tolerate it well  Objective: Vital signs in last 24 hours: Temp:  [97.8 F (36.6 C)-98.6 F (37 C)] 98 F (36.7 C) (05/19 0000) Resp:  [15-24] 20 (05/19 0700) BP: (91-127)/(34-89) 127/59 mmHg (05/19 0700) SpO2:  [95 %] 95 % (05/19 0600) Weight change:  Last BM Date: 04/21/14  Intake/Output from previous day: 05/18 0701 - 05/19 0700 In: 3720 [P.O.:120; I.V.:3600] Out: 1300 [Urine:1300]  PHYSICAL EXAM General appearance: alert, cooperative and no distress Resp: clear to auscultation bilaterally Cardio: regular rate and rhythm, S1, S2 normal, no murmur, click, rub or gallop GI: soft, non-tender; bowel sounds normal; no masses,  no organomegaly Extremities: extremities normal, atraumatic, no cyanosis or edema  Lab Results:    Basic Metabolic Panel:  Recent Labs  04/20/14 0410 04/21/14 0458  NA 142 144  K 4.2 4.1  CL 116* 119*  CO2 15* 12*  GLUCOSE 114* 117*  BUN 42* 25*  CREATININE 3.11* 2.00*  CALCIUM 10.0 10.4   Liver Function Tests:  Recent Labs  04/19/14 1608 04/20/14 0410  AST 14 14  ALT 10 11  ALKPHOS 157* 151*  BILITOT 1.0 0.7  PROT 6.7 5.7*  ALBUMIN 4.1 3.4*    Recent Labs  04/19/14 1608  LIPASE 226*   No results found for this basename: AMMONIA,  in the last 72 hours CBC:  Recent Labs  04/19/14 1608 04/20/14 0410  WBC 10.3 8.3  NEUTROABS 8.4*  --   HGB 10.5* 9.5*  HCT 32.2* 29.2*  MCV 90.2 90.7  PLT 317 290   Cardiac Enzymes: No results found for this  basename: CKTOTAL, CKMB, CKMBINDEX, TROPONINI,  in the last 72 hours BNP: No results found for this basename: PROBNP,  in the last 72 hours D-Dimer: No results found for this basename: DDIMER,  in the last 72 hours CBG: No results found for this basename: GLUCAP,  in the last 72 hours Hemoglobin A1C: No results found for this basename: HGBA1C,  in the last 72 hours Fasting Lipid Panel: No results found for this basename: CHOL, HDL, LDLCALC, TRIG, CHOLHDL, LDLDIRECT,  in the last 72 hours Thyroid Function Tests:  Recent Labs  04/19/14 1608  TSH 2.150   Anemia Panel: No results found for this basename: VITAMINB12, FOLATE, FERRITIN, TIBC, IRON, RETICCTPCT,  in the last 72 hours Coagulation: No results found for this basename: LABPROT, INR,  in the last 72 hours Urine Drug Screen: Drugs of Abuse  No results found for this basename: labopia, cocainscrnur, labbenz, amphetmu, thcu, labbarb    Alcohol Level: No results found for this basename: ETH,  in the last 72 hours Urinalysis:  Recent Labs  04/19/14 2115  COLORURINE YELLOW  LABSPEC <1.005*  PHURINE 5.5  GLUCOSEU NEGATIVE  HGBUR TRACE*  BILIRUBINUR NEGATIVE  KETONESUR NEGATIVE  PROTEINUR NEGATIVE  UROBILINOGEN 0.2  NITRITE NEGATIVE  [place]. Labs:  ABGS  Recent Labs  04/19/14 1620  TCO2 13.7  HCO3 14.6*   CULTURES Recent Results (from the past  240 hour(s))  MRSA PCR SCREENING     Status: None   Collection Time    04/19/14  9:15 PM      Result Value Ref Range Status   MRSA by PCR NEGATIVE  NEGATIVE Final   Comment:            The GeneXpert MRSA Assay (FDA     approved for NASAL specimens     only), is one component of a     comprehensive MRSA colonization     surveillance program. It is not     intended to diagnose MRSA     infection nor to guide or     monitor treatment for     MRSA infections.   Studies/Results: No results found.  Medications:  Prior to Admission:   Prescriptions prior to admission  Medication Sig Dispense Refill  . acetaminophen (TYLENOL) 500 MG tablet Take 500 mg by mouth every 6 (six) hours as needed for mild pain.      Marland Kitchen albuterol (PROVENTIL HFA;VENTOLIN HFA) 108 (90 BASE) MCG/ACT inhaler Inhale 2 puffs into the lungs 2 (two) times daily.      Marland Kitchen allopurinol (ZYLOPRIM) 300 MG tablet Take 300 mg by mouth at bedtime.       Marland Kitchen amLODipine (NORVASC) 5 MG tablet Take 5 mg by mouth at bedtime.       . diphenhydrAMINE (SOMINEX) 25 MG tablet Take 25 mg by mouth at bedtime as needed for sleep.       Marland Kitchen lisinopril (PRINIVIL,ZESTRIL) 20 MG tablet Take 20 mg by mouth daily.      Marland Kitchen lithium carbonate (ESKALITH) 450 MG CR tablet Take 450 mg by mouth at bedtime.      . temazepam (RESTORIL) 30 MG capsule Take 30 mg by mouth at bedtime as needed for sleep.      Marland Kitchen thiothixene (NAVANE) 2 MG capsule Take 2 mg by mouth at bedtime.        Scheduled: . allopurinol  300 mg Oral QHS  . antiseptic oral rinse  15 mL Mouth Rinse BID  . heparin  5,000 Units Subcutaneous 3 times per day  . pneumococcal 23 valent vaccine  0.5 mL Intramuscular Tomorrow-1000  . sodium bicarbonate  650 mg Oral TID  . sodium chloride  3 mL Intravenous Q12H  . thiothixene  2 mg Oral QHS   Continuous: . sodium chloride 150 mL/hr at 04/21/14 0600   [id number], albuterol, colchicine, ondansetron (ZOFRAN) IV, ondansetron, temazepam  Assesment: She was admitted with lithium toxicity and her lithium level is still above the therapeutic range. She had acute renal failure that I think is related to dehydration. About 6 weeks ago she had an intestinal infection with Salmonella but that had cleared per the health Department. She has mental illness that is described as bipolar and she was confused last night but is back to baseline now. Her bicarbonate level was very low Active Problems:   Hypertension   Bipolar disease, chronic   Acute renal failure   Hypotension   Bipolar disorder,  unspecified   ARF (acute renal failure)    Plan: I think she can move from the ICU this may help with her confusion. She will continue with her IV fluids. She will have repeat laboratory work in the morning. I am going to add sodium bicarbonate tablets.    LOS: 2 days   Samantha Conley 04/21/2014, 8:28 AM

## 2014-04-21 NOTE — Progress Notes (Signed)
Patient woke up early am 0300 stating that her RN came in the room and told her she could not use the bathroom.  She insisted on not staying in this room any longer.  Proceeded to get oob and she was assisted to let her walk out into the hallway for reorientation.  Elink notified that she would be on travel monitor for a while.  Patient sat in wheelchair at nurses desk until 0430 when she got tired and when asked if she would go back to her room now she stated she would. Threatened to call the [employer] on [place].

## 2014-04-22 LAB — BASIC METABOLIC PANEL
BUN: 17 mg/dL (ref 6–23)
CALCIUM: 10.1 mg/dL (ref 8.4–10.5)
CO2: 13 meq/L — AB (ref 19–32)
CREATININE: 1.68 mg/dL — AB (ref 0.50–1.10)
Chloride: 122 mEq/L — ABNORMAL HIGH (ref 96–112)
GFR calc Af Amer: 37 mL/min — ABNORMAL LOW (ref >90)
GFR, EST NON AFRICAN AMERICAN: 32 mL/min — AB (ref >90)
GLUCOSE: 101 mg/dL — AB (ref 70–99)
Potassium: 3.9 mEq/L (ref 3.7–5.3)
Sodium: 148 mEq/L — ABNORMAL HIGH (ref 137–147)

## 2014-04-22 LAB — LITHIUM LEVEL: Lithium Lvl: 1.18 mEq/L (ref 0.80–1.40)

## 2014-04-22 LAB — GLUCOSE, CAPILLARY: Glucose-Capillary: 93 mg/dL (ref 70–99)

## 2014-04-22 MED ORDER — POLYVINYL ALCOHOL 1.4 % OP SOLN
2.0000 [drp] | OPHTHALMIC | Status: DC | PRN
  Filled 2014-04-22: qty 15

## 2014-04-22 NOTE — Progress Notes (Signed)
Subjective: She says she feels better. She has no new complaints.  Objective: Vital signs in last 24 hours: Temp:  [97.9 F (36.6 C)-98.3 F (36.8 C)] 98 F (36.7 C) (05/20 0400) Resp:  [20-24] 24 (05/20 0400) BP: (155-160)/(59-65) 155/59 mmHg (05/20 0344) Weight:  [78 kg (171 lb 15.3 oz)] 78 kg (171 lb 15.3 oz) (05/20 0500) Weight change:  Last BM Date: 04/21/14  Intake/Output from previous day: 05/19 0701 - 05/20 0700 In: [id number] [P.O.:240; I.V.:3300] Out: 475 [Urine:475]  PHYSICAL EXAM General appearance: alert, cooperative and no distress Resp: clear to auscultation bilaterally Cardio: regular rate and rhythm, S1, S2 normal, no murmur, click, rub or gallop GI: soft, non-tender; bowel sounds normal; no masses,  no organomegaly Extremities: extremities normal, atraumatic, no cyanosis or edema  Lab Results:    Basic Metabolic Panel:  Recent Labs  04/21/14 0458 04/22/14 0643  NA 144 148*  K 4.1 3.9  CL 119* 122*  CO2 12* 13*  GLUCOSE 117* 101*  BUN 25* 17  CREATININE 2.00* 1.68*  CALCIUM 10.4 10.1   Liver Function Tests:  Recent Labs  04/19/14 1608 04/20/14 0410  AST 14 14  ALT 10 11  ALKPHOS 157* 151*  BILITOT 1.0 0.7  PROT 6.7 5.7*  ALBUMIN 4.1 3.4*    Recent Labs  04/19/14 1608  LIPASE 226*   No results found for this basename: AMMONIA,  in the last 72 hours CBC:  Recent Labs  04/19/14 1608 04/20/14 0410  WBC 10.3 8.3  NEUTROABS 8.4*  --   HGB 10.5* 9.5*  HCT 32.2* 29.2*  MCV 90.2 90.7  PLT 317 290   Cardiac Enzymes: No results found for this basename: CKTOTAL, CKMB, CKMBINDEX, TROPONINI,  in the last 72 hours BNP: No results found for this basename: PROBNP,  in the last 72 hours D-Dimer: No results found for this basename: DDIMER,  in the last 72 hours CBG:  Recent Labs  04/22/14 0813  GLUCAP 93   Hemoglobin A1C: No results found for this basename: HGBA1C,  in the last 72 hours Fasting Lipid Panel: No results found for  this basename: CHOL, HDL, LDLCALC, TRIG, CHOLHDL, LDLDIRECT,  in the last 72 hours Thyroid Function Tests:  Recent Labs  04/19/14 1608  TSH 2.150   Anemia Panel: No results found for this basename: VITAMINB12, FOLATE, FERRITIN, TIBC, IRON, RETICCTPCT,  in the last 72 hours Coagulation: No results found for this basename: LABPROT, INR,  in the last 72 hours Urine Drug Screen: Drugs of Abuse  No results found for this basename: labopia, cocainscrnur, labbenz, amphetmu, thcu, labbarb    Alcohol Level: No results found for this basename: ETH,  in the last 72 hours Urinalysis:  Recent Labs  04/19/14 2115  COLORURINE YELLOW  LABSPEC <1.005*  PHURINE 5.5  GLUCOSEU NEGATIVE  HGBUR TRACE*  BILIRUBINUR NEGATIVE  KETONESUR NEGATIVE  PROTEINUR NEGATIVE  UROBILINOGEN 0.2  NITRITE NEGATIVE  LEUKOCYTESUR NEGATIVE   Misc. Labs:  ABGS  Recent Labs  04/19/14 1620  TCO2 13.7  HCO3 14.6*   CULTURES Recent Results (from the past 240 hour(s))  MRSA PCR SCREENING     Status: None   Collection Time    04/19/14  9:15 PM      Result Value Ref Range Status   MRSA by PCR NEGATIVE  NEGATIVE Final   Comment:            The GeneXpert MRSA Assay (FDA     approved for NASAL specimens  only), is one component of a     comprehensive MRSA colonization     surveillance program. It is not     intended to diagnose MRSA     infection nor to guide or     monitor treatment for     MRSA infections.   Studies/Results: No results found.  Medications:  Prior to Admission:  Prescriptions prior to admission  Medication Sig Dispense Refill  . acetaminophen (TYLENOL) 500 MG tablet Take 500 mg by mouth every 6 (six) hours as needed for mild pain.      Marland Kitchen albuterol (PROVENTIL HFA;VENTOLIN HFA) 108 (90 BASE) MCG/ACT inhaler Inhale 2 puffs into the lungs 2 (two) times daily.      Marland Kitchen allopurinol (ZYLOPRIM) 300 MG tablet Take 300 mg by mouth at bedtime.       Marland Kitchen amLODipine (NORVASC) 5 MG tablet Take  5 mg by mouth at bedtime.       . diphenhydrAMINE (SOMINEX) 25 MG tablet Take 25 mg by mouth at bedtime as needed for sleep.       Marland Kitchen lisinopril (PRINIVIL,ZESTRIL) 20 MG tablet Take 20 mg by mouth daily.      Marland Kitchen lithium carbonate (ESKALITH) 450 MG CR tablet Take 450 mg by mouth at bedtime.      . temazepam (RESTORIL) 30 MG capsule Take 30 mg by mouth at bedtime as needed for sleep.      Marland Kitchen thiothixene (NAVANE) 2 MG capsule Take 2 mg by mouth at bedtime.        Scheduled: . allopurinol  300 mg Oral QHS  . antiseptic oral rinse  15 mL Mouth Rinse BID  . heparin  5,000 Units Subcutaneous 3 times per day  . pneumococcal 23 valent vaccine  0.5 mL Intramuscular Tomorrow-1000  . sodium bicarbonate  650 mg Oral TID  . sodium chloride  3 mL Intravenous Q12H  . thiothixene  2 mg Oral QHS   Continuous: . sodium chloride 150 mL/hr at 04/22/14 [id number]   [id number], albuterol, colchicine, ondansetron (ZOFRAN) IV, ondansetron, polyvinyl alcohol, temazepam  Assesment: She was admitted with acute renal failure, dehydration lithium toxicity. She is improving. Lithium level is down to therapeutic range and now. Her renal failure has improved. She looks like she is better with her volume status. She does have chronic bipolar disease and will need treatment. Active Problems:   Hypertension   Bipolar disease, chronic   Acute renal failure   Hypotension   Bipolar disorder, unspecified   ARF (acute renal failure)    Plan: She will try to get up and move around more today. She may be able to be started back on lithium but I would like to consult with psychiatry first about what would be a good choice for treatment. This will be a telephone consult    LOS: 3 days   Alonza Bogus 04/22/2014, 9:05 AM

## 2014-04-23 DIAGNOSIS — F3130 Bipolar disorder, current episode depressed, mild or moderate severity, unspecified: Secondary | ICD-10-CM

## 2014-04-23 LAB — BASIC METABOLIC PANEL
BUN: 12 mg/dL (ref 6–23)
CO2: 13 meq/L — AB (ref 19–32)
CREATININE: 1.62 mg/dL — AB (ref 0.50–1.10)
Calcium: 9.7 mg/dL (ref 8.4–10.5)
Chloride: 123 mEq/L — ABNORMAL HIGH (ref 96–112)
GFR calc Af Amer: 39 mL/min — ABNORMAL LOW (ref >90)
GFR calc non Af Amer: 33 mL/min — ABNORMAL LOW (ref >90)
Glucose, Bld: 112 mg/dL — ABNORMAL HIGH (ref 70–99)
Potassium: 3.6 mEq/L — ABNORMAL LOW (ref 3.7–5.3)
Sodium: 148 mEq/L — ABNORMAL HIGH (ref 137–147)

## 2014-04-23 MED ORDER — LORAZEPAM 2 MG/ML IJ SOLN: 0.5000 mg | Freq: Four times a day (QID) | INTRAMUSCULAR | Status: DC | PRN

## 2014-04-23 MED ORDER — LAMOTRIGINE 25 MG PO TABS
25.0000 mg | ORAL_TABLET | Freq: Two times a day (BID) | ORAL | Status: AC
  Administered 2014-04-23 – 2014-04-24 (×2): 25 mg via ORAL
  Filled 2014-04-23 (×2): qty 1

## 2014-04-23 MED ORDER — CITALOPRAM HYDROBROMIDE 20 MG PO TABS
10.0000 mg | ORAL_TABLET | Freq: Every day | ORAL | Status: DC
  Administered 2014-04-23 – 2014-04-25 (×3): 10 mg via ORAL
  Filled 2014-04-23 (×3): qty 1

## 2014-04-23 MED ORDER — LORAZEPAM 2 MG/ML IJ SOLN: 0.5000 mg | INTRAMUSCULAR | Status: DC | PRN

## 2014-04-23 MED ORDER — LAMOTRIGINE 25 MG PO TABS
50.0000 mg | ORAL_TABLET | Freq: Two times a day (BID) | ORAL | Status: DC
  Administered 2014-04-24 – 2014-04-25 (×2): 50 mg via ORAL
  Filled 2014-04-23 (×6): qty 2

## 2014-04-23 MED ORDER — FUROSEMIDE 10 MG/ML IJ SOLN
20.0000 mg | Freq: Once | INTRAMUSCULAR | Status: AC
  Administered 2014-04-23: 20 mg via INTRAVENOUS
  Filled 2014-04-23: qty 2

## 2014-04-23 MED ORDER — LAMOTRIGINE 25 MG PO TABS
ORAL_TABLET | ORAL | Status: AC
  Filled 2014-04-23: qty 1

## 2014-04-23 NOTE — BH Assessment (Addendum)
Tele Assessment Note   Samantha Conley is an 63 y.o. female on the medical floor at [facility]. Patient has a dx's of Bipolar Disorder. Per staff notes, patient refused AM medications and vital signs to be taken. Patient was very verbally aggressive when waking up this AM and refused for the CNA to do anything to assist her. Patient was alert but was not oriented to the date and time. Writer met with patient via tele assessment. She appeared calm and cooperative. Patient explained that she had a difficult night. Sts that she moved by nursing staff from her hospital room last night 2x's. Sts this made her extremely angry, frustrated, and upset. Patient reports not getting enough sleep last night and when she woke up this morning she was irritated with her nurse. Pt sts, "If you were in the hospital trying to sleeping wouldn't you get mad if someone did the same to you". Patient is currently alert and oriented. Sts that she was confused this morning only b/c she has been in the hospital for several days. Sts that she has simply loss track of time. She denies SI, HI, and AVH's. She reports having a psychiatrist that recently retired. She refused to provide this Probation officer with further details about her psychiatrist. She does report taking Lithium since 1988. She does not recall what other medications she is taking at this time. No hx of prior hospital admissions.  Writer ran this patient by Heloise Purpura, NP who recommends outpatient follow up with a psychiatrist for medication management. Patient denies SI, HI, and AVH's. She is not in any imminent   danger at this time and does not meet criteria for inpatient hospitalization.   Axis I: Bipolar I Disorder Nos Axis II: Deferred Axis III:  Past Medical History  Diagnosis Date  . Hypertension     since 2007  . Gout   . Asthma   . Bipolar disorder     patient denies  . Insomnia    Axis IV: other psychosocial or environmental problems, problems  related to social environment, problems with access to health care services and problems with primary support group Axis V: 31-40 impairment in reality testing  Past Medical History:  Past Medical History  Diagnosis Date  . Hypertension     since 2007  . Gout   . Asthma   . Bipolar disorder     patient denies  . Insomnia     Past Surgical History  Procedure Laterality Date  . Chemical imbalance    . Cholecystectomy    . Total abdominal hysterectomy    . Tonsillectomy    . Nasal sinus surgery      Family History:  Family History  Problem Relation Age of Onset  . CAD Mother   . CAD Father   . CVA Mother   . Parkinsonism Father     Social History:  reports that she has never smoked. She does not have any smokeless tobacco history on file. She reports that she does not drink alcohol or use illicit drugs.  Additional Social History:  Alcohol / Drug Use Pain Medications: SEE MAR Prescriptions: SEE MAR Over the Counter: SEE MAR History of alcohol / drug use?: No history of alcohol / drug abuse  CIWA: CIWA-Ar BP: 121/70 mmHg Pulse Rate: 71 COWS:    Allergies:  Allergies  Allergen Reactions  . Codeine Other (See Comments)    Makes states it makes her "spacey"  . Doxycycline  Severe diarrhea  . Penicillins Other (See Comments)    unknown  . Promethazine Other (See Comments)    Patient states she is not allergic but it makes her  "spacey and not in control"    Home Medications:  Medications Prior to Admission  Medication Sig Dispense Refill  . acetaminophen (TYLENOL) 500 MG tablet Take 500 mg by mouth every 6 (six) hours as needed for mild pain.      Marland Kitchen albuterol (PROVENTIL HFA;VENTOLIN HFA) 108 (90 BASE) MCG/ACT inhaler Inhale 2 puffs into the lungs 2 (two) times daily.      Marland Kitchen allopurinol (ZYLOPRIM) 300 MG tablet Take 300 mg by mouth at bedtime.       Marland Kitchen amLODipine (NORVASC) 5 MG tablet Take 5 mg by mouth at bedtime.       . diphenhydrAMINE (SOMINEX) 25 MG  tablet Take 25 mg by mouth at bedtime as needed for sleep.       Marland Kitchen lisinopril (PRINIVIL,ZESTRIL) 20 MG tablet Take 20 mg by mouth daily.      Marland Kitchen lithium carbonate (ESKALITH) 450 MG CR tablet Take 450 mg by mouth at bedtime.      . temazepam (RESTORIL) 30 MG capsule Take 30 mg by mouth at bedtime as needed for sleep.      Marland Kitchen thiothixene (NAVANE) 2 MG capsule Take 2 mg by mouth at bedtime.         OB/GYN Status:  No LMP recorded. Patient has had a hysterectomy.  General Assessment Data Location of Assessment: [facility] ED (med floor) Is this a Tele or Face-to-Face Assessment?: Tele Assessment Is this an Initial Assessment or a Re-assessment for this encounter?: Initial Assessment Living Arrangements: Alone Can pt return to current living arrangement?: Yes Admission Status: Voluntary Is patient capable of signing voluntary admission?: Yes Transfer from: [facility] Referral Source: Self/Family/Friend     [place] Living Arrangements: Alone Name of Psychiatrist:  (admits to having a psychiatrist in the past) Name of Therapist:  (No therapist reported )  Education Status Is patient currently in school?: No  Risk to self Suicidal Ideation: No Suicidal Intent: No Is patient at risk for suicide?: No Suicidal Plan?: No Access to Means: No What has been your use of drugs/alcohol within the last 12 months?:  (Patient is ) Previous Attempts/Gestures: No How many times?:  (0) Other Self Harm Risks:  (none reported ) Triggers for Past Attempts: Other (Comment) (no previous attempts/gestures ) Intentional Self Injurious Behavior: None Family Suicide History: No Recent stressful life event(s): Other (Comment) ("My only stress is being moved 2x's last night") Persecutory voices/beliefs?: No Depression: Yes Depression Symptoms: Feeling angry/irritable Substance abuse history and/or treatment for substance abuse?: No Suicide prevention information given to non-admitted patients:  Not applicable  Risk to Others Homicidal Ideation: No Thoughts of Harm to Others: No Current Homicidal Intent: No Current Homicidal Plan: No Access to Homicidal Means: No Identified Victim:  (n/a) History of harm to others?: No Assessment of Violence: None Noted Violent Behavior Description:  (Patient is calm and cooperative ) Does patient have access to weapons?: No Criminal Charges Pending?: No Does patient have a court date: No  Psychosis Hallucinations: None noted ("A doctor put me on meds for schizophrenia") Delusions: None noted  Mental Status Report Appear/Hygiene: Disheveled Eye Contact: Good Motor Activity: Freedom of movement Speech: Logical/coherent Level of Consciousness: Alert Mood: Depressed Affect: Appropriate to circumstance Anxiety Level: None Thought Processes: Coherent;Relevant Judgement: Impaired Orientation: Person;Time;Situation;Place Obsessive Compulsive Thoughts/Behaviors:  None  Cognitive Functioning Concentration: Decreased Memory: Recent Intact;Remote Intact IQ: Average Insight: Fair Impulse Control: Fair Appetite: Fair (Not eating well since hosp. admit. Sts she doesn't like food) Weight Loss:  (none reported ) Weight Gain:  (none reported) Sleep: No Change Total Hours of Sleep:  (sleeps ok but not since admission to the hospital. ) Vegetative Symptoms: None  ADLScreening [facility] Assessment Services) Patient's cognitive ability adequate to safely complete daily activities?: Yes Patient able to express need for assistance with ADLs?: No Independently performs ADLs?: Yes (appropriate for developmental age)  Prior Inpatient Therapy Prior Inpatient Therapy: No Prior Therapy Dates:  (n/a) Prior Therapy Facilty/Provider(s):  (n/a) Reason for Treatment:  (n/a)     ADL Screening (condition at time of admission) Patient's cognitive ability adequate to safely complete daily activities?: Yes Is the patient deaf or have difficulty hearing?:  No Does the patient have difficulty seeing, even when wearing glasses/contacts?: No Does the patient have difficulty concentrating, remembering, or making decisions?: Yes Patient able to express need for assistance with ADLs?: No Does the patient have difficulty dressing or bathing?: No Independently performs ADLs?: Yes (appropriate for developmental age) Does the patient have difficulty walking or climbing stairs?: No Weakness of Legs: None Weakness of Arms/Hands: None  Home Assistive Devices/Equipment Home Assistive Devices/Equipment: None  Therapy Consults (therapy consults require a physician order) PT Evaluation Needed: No OT Evalulation Needed: No SLP Evaluation Needed: No Abuse/Neglect Assessment (Assessment to be complete while patient is alone) Physical Abuse: Denies Verbal Abuse: Denies Sexual Abuse: Denies Exploitation of patient/patient's resources: Denies Self-Neglect: Denies Values / Beliefs Cultural Requests During Hospitalization: None Spiritual Requests During Hospitalization: None Consults Spiritual Care Consult Needed: No Social Work Consult Needed: No Regulatory affairs officer (For Healthcare) Advance Directive: Patient does not have advance directive Type of Advance Directive: Living will Advance Directive not in Chart: Copy requested from other (Comment) Pre-existing out of facility DNR order (yellow form or pink MOST form): No Nutrition Screen- [facility] Patient's home diet: Regular Have you recently lost weight without trying?: Yes If yes, how much weight have you lost?: 14-23 lb Have you been eating poorly because of a decreased appetite?: Yes Malnutrition Screening Tool Score: 3  Additional Information 1:1 In Past 12 Months?: No CIRT Risk: No Elopement Risk: No Does patient have medical clearance?: Yes     Disposition:  Disposition Initial Assessment Completed for this Encounter: Yes Disposition of Patient: Other dispositions;Outpatient  treatment (Ran by Heloise Purpura who agrees no criteria for inpatient admission) Type of outpatient treatment: Adult (F/u with outpatient psychiatry for medication managment) Other disposition(s): Other (Comment) (F/u with outpatient psychiatrist )  Evangeline Gula 04/23/2014 11:09 AM

## 2014-04-23 NOTE — Progress Notes (Signed)
Subjective: She became acutely agitated and confused this morning. She had some confusion while she was in the intensive care unit and I think moving her to a different environment has caused more trouble. She has been off lithium since admission because she was lithium toxic. Her lithium level yesterday was back down to therapeutic range. She is very angry. She has some edema of both legs  Objective: Vital signs in last 24 hours: Temp:  [97.7 F (36.5 C)-98 F (36.7 C)] 97.8 F (36.6 C) (05/21 [id number]) Pulse Rate:  [67-71] 71 (05/21 0637) Resp:  [18-21] 20 (05/21 0637) BP: (121-126)/(70-79) 121/70 mmHg (05/21 0637) SpO2:  [98 %] 98 % (05/21 0637) Weight:  [80.105 kg (176 lb 9.6 oz)] 80.105 kg (176 lb 9.6 oz) (05/20 2258) Weight change: 2.105 kg (4 lb 10.3 oz) Last BM Date: 04/22/14  Intake/Output from previous day: 05/20 0701 - 05/21 0700 In: 2190 [P.O.:240; I.V.:1950] Out: 550 [Urine:550]  PHYSICAL EXAM General appearance: alert, mildly obese and Confused and angry Resp: clear to auscultation bilaterally Cardio: regular rate and rhythm, S1, S2 normal, no murmur, click, rub or gallop GI: She has 1+ edema of her legs Extremities: extremities normal, atraumatic, no cyanosis or edema  Lab Results:    Basic Metabolic Panel:  Recent Labs  04/22/14 0643 04/23/14 0624  NA 148* 148*  K 3.9 3.6*  CL 122* 123*  CO2 13* 13*  GLUCOSE 101* 112*  BUN 17 12  CREATININE 1.68* 1.62*  CALCIUM 10.1 9.7   Liver Function Tests: No results found for this basename: AST, ALT, ALKPHOS, BILITOT, PROT, ALBUMIN,  in the last 72 hours No results found for this basename: LIPASE, AMYLASE,  in the last 72 hours No results found for this basename: AMMONIA,  in the last 72 hours CBC: No results found for this basename: WBC, NEUTROABS, HGB, HCT, MCV, PLT,  in the last 72 hours Cardiac Enzymes: No results found for this basename: CKTOTAL, CKMB, CKMBINDEX, TROPONINI,  in the last 72 hours BNP: No  results found for this basename: PROBNP,  in the last 72 hours D-Dimer: No results found for this basename: DDIMER,  in the last 72 hours CBG:  Recent Labs  04/22/14 0813  GLUCAP 93   Hemoglobin A1C: No results found for this basename: HGBA1C,  in the last 72 hours Fasting Lipid Panel: No results found for this basename: CHOL, HDL, LDLCALC, TRIG, CHOLHDL, LDLDIRECT,  in the last 72 hours Thyroid Function Tests: No results found for this basename: TSH, T4TOTAL, FREET4, T3FREE, THYROIDAB,  in the last 72 hours Anemia Panel: No results found for this basename: VITAMINB12, FOLATE, FERRITIN, TIBC, IRON, RETICCTPCT,  in the last 72 hours Coagulation: No results found for this basename: LABPROT, INR,  in the last 72 hours Urine Drug Screen: Drugs of Abuse  No results found for this basename: labopia, cocainscrnur, labbenz, amphetmu, thcu, labbarb    Alcohol Level: No results found for this basename: ETH,  in the last 72 hours Urinalysis: No results found for this basename: COLORURINE, APPERANCEUR, LABSPEC, PHURINE, GLUCOSEU, HGBUR, BILIRUBINUR, KETONESUR, PROTEINUR, UROBILINOGEN, NITRITE, LEUKOCYTESUR,  in the last 72 hours Misc. Labs:  ABGS No results found for this basename: PHART, PCO2, PO2ART, TCO2, HCO3,  in the last 72 hours CULTURES Recent Results (from the past 240 hour(s))  MRSA PCR SCREENING     Status: None   Collection Time    04/19/14  9:15 PM      Result Value Ref Range Status  MRSA by PCR NEGATIVE  NEGATIVE Final   Comment:            The GeneXpert MRSA Assay (FDA     approved for NASAL specimens     only), is one component of a     comprehensive MRSA colonization     surveillance program. It is not     intended to diagnose MRSA     infection nor to guide or     monitor treatment for     MRSA infections.   Studies/Results: No results found.  Medications:  Prior to Admission:  Prescriptions prior to admission  Medication Sig Dispense Refill  .  acetaminophen (TYLENOL) 500 MG tablet Take 500 mg by mouth every 6 (six) hours as needed for mild pain.      Marland Kitchen albuterol (PROVENTIL HFA;VENTOLIN HFA) 108 (90 BASE) MCG/ACT inhaler Inhale 2 puffs into the lungs 2 (two) times daily.      Marland Kitchen allopurinol (ZYLOPRIM) 300 MG tablet Take 300 mg by mouth at bedtime.       Marland Kitchen amLODipine (NORVASC) 5 MG tablet Take 5 mg by mouth at bedtime.       . diphenhydrAMINE (SOMINEX) 25 MG tablet Take 25 mg by mouth at bedtime as needed for sleep.       Marland Kitchen lisinopril (PRINIVIL,ZESTRIL) 20 MG tablet Take 20 mg by mouth daily.      Marland Kitchen lithium carbonate (ESKALITH) 450 MG CR tablet Take 450 mg by mouth at bedtime.      . temazepam (RESTORIL) 30 MG capsule Take 30 mg by mouth at bedtime as needed for sleep.      Marland Kitchen thiothixene (NAVANE) 2 MG capsule Take 2 mg by mouth at bedtime.        Scheduled: . allopurinol  300 mg Oral QHS  . furosemide  20 mg Intravenous Once  . heparin  5,000 Units Subcutaneous 3 times per day  . pneumococcal 23 valent vaccine  0.5 mL Intramuscular Tomorrow-1000  . sodium bicarbonate  650 mg Oral TID  . sodium chloride  3 mL Intravenous Q12H  . thiothixene  2 mg Oral QHS   Continuous: . sodium chloride 50 mL/hr at 04/23/14 [id number]   [id number], albuterol, colchicine, ondansetron (ZOFRAN) IV, ondansetron, polyvinyl alcohol, temazepam  Assesment: She has been admitted with acute renal failure probably due to dehydration. She has been rehydrated. At baseline she has a mental illness that has been labeled as bipolar disease but apparently her treating psychiatrist who she has not seen for 3-4 years told her that he was not sure that that was the correct diagnosis. She has had some episodes of psychosis. She has been very confused. Some of this I think is from being out of her normal environment. She has been being volume repleted and now has some edema. Her acute renal failure is better. She still has a low bicarbonate level and may have some element  of renal tubular acidosis. This may be related to her lithium treatment Active Problems:   Hypertension   Bipolar disease, chronic   Acute renal failure   Hypotension   Bipolar disorder, unspecified   ARF (acute renal failure)    Plan: She will have tele psychiatry consultation. I don't think she's going to be a candidate to go back on lithium. I will give her some Ativan to see if it will help her to relax somewhat.    LOS: 4 days   Alonza Bogus 04/23/2014, 8:31 AM

## 2014-04-23 NOTE — Progress Notes (Addendum)
Patient refused AM medication and vital signs to be taken. Patient was very verbally aggressive when waking up this AM and refused for the CNA and I to do anything to assist her. Patient was alert but was not oriented to the date and time. After talking with patient @0635 , patient allowed me to take her vital signs and allowed lab to draw some blood. Will continue to monitor patient at this time.

## 2014-04-23 NOTE — Consult Note (Signed)
Telepsych Consultation   Reason for Consult:  Aggressive behavior Referring Physician:  [facility] MD MONTE BRONDER is an 63 y.o. female.  Assessment: AXIS I:  Bipolar, Depressed and Major Depression, Recurrent severe AXIS II:  Deferred AXIS III:   Past Medical History  Diagnosis Date  . Hypertension     since 2007  . Gout   . Asthma   . Bipolar disorder     patient denies  . Insomnia    AXIS IV:  other psychosocial or environmental problems and problems related to social environment AXIS V:  51-60 moderate symptoms  Plan:  No evidence of imminent risk to self or others at present.   Patient does not meet criteria for psychiatric inpatient admission. Supportive therapy provided about ongoing stressors. Refer to IOP. Discussed crisis plan, support from social network, calling 911, coming to the Emergency Department, and calling Suicide Hotline.  Subjective:   NAHOMI HEGNER is a 63 y.o. female patient presenting to medical floor for treatment of nonpsychiatric conditions. Pt is agitated and not responding well to medications. Pt has hx of severe dehydration and renal damage secondary to lithium toxicity. Dr. Renette Butters is requesting medication input/management and alternatives to Lithium. Pt denies SI, HI, and AVH, contracts for safety. Pt does express concern about taking lithium again.  HPI:  GLORIANA PILTZ is an 63 y.o. female on the medical floor at [facility]. Patient has a dx's of Bipolar Disorder. Per staff notes, patient refused AM medications and vital signs to be taken. Patient was very verbally aggressive when waking up this AM and refused for the CNA to do anything to assist her. Patient was alert but was not oriented to the date and time.  Writer met with patient via tele assessment. She appeared calm and cooperative. Patient explained that she had a difficult night. Sts that she moved by nursing staff from her hospital room last night 2x's. Sts this made her  extremely angry, frustrated, and upset. Patient reports not getting enough sleep last night and when she woke up this morning she was irritated with her nurse. Pt sts, "If you were in the hospital trying to sleeping wouldn't you get mad if someone did the same to you". Patient is currently alert and oriented. Sts that she was confused this morning only b/c she has been in the hospital for several days. Sts that she has simply loss track of time. She denies SI, HI, and AVH's. She reports having a psychiatrist that recently retired. She refused to provide this Probation officer with further details about her psychiatrist. She does report taking Lithium since 1988. She does not recall what other medications she is taking at this time. No hx of prior hospital admissions.  HPI Elements:   Location:  Generalized, AP medical floor. Quality:  Worsening. Severity:  Moderate. Timing:  Intermittent. Duration:  Chronic.  Past Psychiatric History: Past Medical History  Diagnosis Date  . Hypertension     since 2007  . Gout   . Asthma   . Bipolar disorder     patient denies  . Insomnia     reports that she has never smoked. She does not have any smokeless tobacco history on file. She reports that she does not drink alcohol or use illicit drugs. Family History  Problem Relation Age of Onset  . CAD Mother   . CAD Father   . CVA Mother   . Parkinsonism Father    Family History Substance Abuse: No  Family Supports: Yes, List: Living Arrangements: Alone Can pt return to current living arrangement?: Yes Allergies:   Allergies  Allergen Reactions  . Codeine Other (See Comments)    Makes states it makes her "spacey"  . Doxycycline     Severe diarrhea  . Penicillins Other (See Comments)    unknown  . Promethazine Other (See Comments)    Patient states she is not allergic but it makes her  "spacey and not in control"    ACT Assessment Complete:  Yes:    Educational Status    Risk to Self: Risk to  self Suicidal Ideation: No Suicidal Intent: No Is patient at risk for suicide?: No Suicidal Plan?: No Access to Means: No What has been your use of drugs/alcohol within the last 12 months?:  (Patient is ) Previous Attempts/Gestures: No How many times?:  (0) Other Self Harm Risks:  (none reported ) Triggers for Past Attempts: Other (Comment) (no previous attempts/gestures ) Intentional Self Injurious Behavior: None Family Suicide History: No Recent stressful life event(s): Other (Comment) ("My only stress is being moved 2x's last night") Persecutory voices/beliefs?: No Depression: Yes Depression Symptoms: Feeling angry/irritable Substance abuse history and/or treatment for substance abuse?: No Suicide prevention information given to non-admitted patients: Not applicable  Risk to Others: Risk to Others Homicidal Ideation: No Thoughts of Harm to Others: No Current Homicidal Intent: No Current Homicidal Plan: No Access to Homicidal Means: No Identified Victim:  (n/a) History of harm to others?: No Assessment of Violence: None Noted Violent Behavior Description:  (Patient is calm and cooperative ) Does patient have access to weapons?: No Criminal Charges Pending?: No Does patient have a court date: No  Abuse: Abuse/Neglect Assessment (Assessment to be complete while patient is alone) Physical Abuse: Denies Verbal Abuse: Denies Sexual Abuse: Denies Exploitation of patient/patient's resources: Denies Self-Neglect: Denies  Prior Inpatient Therapy: Prior Inpatient Therapy Prior Inpatient Therapy: No Prior Therapy Dates:  (n/a) Prior Therapy Facilty/Provider(s):  (n/a) Reason for Treatment:  (n/a)  Prior Outpatient Therapy:    Additional Information: Additional Information 1:1 In Past 12 Months?: No CIRT Risk: No Elopement Risk: No Does patient have medical clearance?: Yes    Objective: Blood pressure 117/70, pulse 68, temperature 97.7 F (36.5 C), temperature source Oral,  resp. rate 20, height 5' 4.5" (1.638 m), weight 80.105 kg (176 lb 9.6 oz), SpO2 97.00%.Body mass index is 29.86 kg/(m^2). Results for orders placed during the hospital encounter of 04/19/14 (from the past 72 hour(s))  BASIC METABOLIC PANEL     Status: Abnormal   Collection Time    04/21/14  4:58 AM      Result Value Ref Range   Sodium 144  137 - 147 mEq/L   Potassium 4.1  3.7 - 5.3 mEq/L   Chloride 119 (*) 96 - 112 mEq/L   CO2 12 (*) 19 - 32 mEq/L   Glucose, Bld 117 (*) 70 - 99 mg/dL   BUN 25 (*) 6 - 23 mg/dL   Comment: DELTA CHECK NOTED   Creatinine, Ser 2.00 (*) 0.50 - 1.10 mg/dL   Calcium 10.4  8.4 - 10.5 mg/dL   GFR calc non Af Amer 26 (*) >90 mL/min   GFR calc Af Amer 30 (*) >90 mL/min   Comment: (NOTE)     The eGFR has been calculated using the CKD EPI equation.     This calculation has not been validated in all clinical situations.     eGFR's persistently <90 mL/min signify possible  Chronic Kidney     Disease.  LITHIUM LEVEL     Status: Abnormal   Collection Time    04/21/14  4:58 AM      Result Value Ref Range   Lithium Lvl 1.59 (*) 0.80 - 1.40 mEq/L   Comment: CRITICAL RESULT CALLED TO, READ BACK BY AND VERIFIED WITH:     MCCLEOD,U AT 6:50AM ON 04/21/14 BY FESTERMAN,C  BASIC METABOLIC PANEL     Status: Abnormal   Collection Time    04/22/14  6:43 AM      Result Value Ref Range   Sodium 148 (*) 137 - 147 mEq/L   Potassium 3.9  3.7 - 5.3 mEq/L   Chloride 122 (*) 96 - 112 mEq/L   CO2 13 (*) 19 - 32 mEq/L   Glucose, Bld 101 (*) 70 - 99 mg/dL   BUN 17  6 - 23 mg/dL   Creatinine, Ser 1.68 (*) 0.50 - 1.10 mg/dL   Calcium 10.1  8.4 - 10.5 mg/dL   GFR calc non Af Amer 32 (*) >90 mL/min   GFR calc Af Amer 37 (*) >90 mL/min   Comment: (NOTE)     The eGFR has been calculated using the CKD EPI equation.     This calculation has not been validated in all clinical situations.     eGFR's persistently <90 mL/min signify possible Chronic Kidney     Disease.  LITHIUM LEVEL      Status: None   Collection Time    04/22/14  6:43 AM      Result Value Ref Range   Lithium Lvl 1.18  0.80 - 1.40 mEq/L  GLUCOSE, CAPILLARY     Status: None   Collection Time    04/22/14  8:13 AM      Result Value Ref Range   Glucose-Capillary 93  70 - 99 mg/dL  BASIC METABOLIC PANEL     Status: Abnormal   Collection Time    04/23/14  6:24 AM      Result Value Ref Range   Sodium 148 (*) 137 - 147 mEq/L   Potassium 3.6 (*) 3.7 - 5.3 mEq/L   Chloride 123 (*) 96 - 112 mEq/L   CO2 13 (*) 19 - 32 mEq/L   Glucose, Bld 112 (*) 70 - 99 mg/dL   BUN 12  6 - 23 mg/dL   Creatinine, Ser 1.62 (*) 0.50 - 1.10 mg/dL   Calcium 9.7  8.4 - 10.5 mg/dL   GFR calc non Af Amer 33 (*) >90 mL/min   GFR calc Af Amer 39 (*) >90 mL/min   Comment: (NOTE)     The eGFR has been calculated using the CKD EPI equation.     This calculation has not been validated in all clinical situations.     eGFR's persistently <90 mL/min signify possible Chronic Kidney     Disease.   Labs are reviewed and are pertinent for Many electrolytes out of balance, but not concerning from a psychiatric standpoint.  Current Facility-Administered Medications  Medication Dose Route Frequency Provider Last Rate Last Dose  . 0.9 %  sodium chloride infusion   Intravenous Continuous Alonza Bogus, MD 50 mL/hr at 04/23/14 [id number]    . acetaminophen (TYLENOL) tablet 500 mg  500 mg Oral Q6H PRN Doree Albee, MD   500 mg at 04/20/14 2137  . albuterol (PROVENTIL) (2.5 MG/3ML) 0.083% nebulizer solution 2.5 mg  2.5 mg Inhalation Q6H PRN Alonza Bogus, MD      .  allopurinol (ZYLOPRIM) tablet 300 mg  300 mg Oral QHS Doree Albee, MD   300 mg at 04/22/14 2123  . colchicine tablet 0.6 mg  0.6 mg Oral BID PRN Alonza Bogus, MD      . heparin injection 5,000 Units  5,000 Units Subcutaneous 3 times per day Doree Albee, MD   5,000 Units at 04/23/14 1604  . lamoTRIgine (LAMICTAL) tablet 25 mg  25 mg Oral BID Benjamine Mola, FNP      .  [START ON 04/24/2014] lamoTRIgine (LAMICTAL) tablet 50 mg  50 mg Oral BID Benjamine Mola, FNP      . LORazepam (ATIVAN) injection 0.5 mg  0.5 mg Intravenous Q6H PRN Benjamine Mola, FNP      . ondansetron (ZOFRAN) tablet 4 mg  4 mg Oral Q6H PRN Nimish Luther Parody, MD       Or  . ondansetron (ZOFRAN) injection 4 mg  4 mg Intravenous Q6H PRN Nimish C Gosrani, MD      . pneumococcal 23 valent vaccine (PNU-IMMUNE) injection 0.5 mL  0.5 mL Intramuscular Tomorrow-1000 Nimish C Gosrani, MD      . polyvinyl alcohol (LIQUIFILM TEARS) 1.4 % ophthalmic solution 2 drop  2 drop Both Eyes PRN Alonza Bogus, MD      . sodium bicarbonate tablet 650 mg  650 mg Oral TID Alonza Bogus, MD   650 mg at 04/23/14 1602  . sodium chloride 0.9 % injection 3 mL  3 mL Intravenous Q12H Doree Albee, MD   3 mL at 04/22/14 2123  . thiothixene (NAVANE) capsule 2 mg  2 mg Oral QHS Doree Albee, MD   2 mg at 04/22/14 2123    Psychiatric Specialty Exam:     Blood pressure 117/70, pulse 68, temperature 97.7 F (36.5 C), temperature source Oral, resp. rate 20, height 5' 4.5" (1.638 m), weight 80.105 kg (176 lb 9.6 oz), SpO2 97.00%.Body mass index is 29.86 kg/(m^2).  General Appearance: Fairly Groomed  Engineer, water::  Good  Speech:  Clear and Coherent  Volume:  Normal  Mood:  Depressed  Affect:  Depressed  Thought Process:  Coherent  Orientation:  Full (Time, Place, and Person)  Thought Content:  WDL  Suicidal Thoughts:  No  Homicidal Thoughts:  No  Memory:  Immediate;   Fair Recent;   Fair Remote;   Fair  Judgement:  Fair  Insight:  Fair  Psychomotor Activity:  Decreased  Concentration:  Good  Recall:  Fair  Akathisia:  No  Handed:    AIMS (if indicated):     Assets:  Desire for Improvement Resilience  Sleep:      Treatment Plan Summary: Medication Management -Discontinue Temazepam -Change frequency of Ativan 0.71m IV from q4h PRN to q6h PRN -Add Lamictal 283mbid to titrate to 5035mid after 24  hours. -Add Celexa 56m54mily as an adjuvant to Lamictal for mood stabilization  Disposition: Disposition Initial Assessment Completed for this Encounter: Yes Disposition of Patient: Other dispositions;Outpatient treatment  Type of outpatient treatment: Adult (F/u with outpatient psychiatry for medication managment) Other disposition(s): Other (Comment) (F/u with outpatient psychiatrist )  JohnBenjamine MolaP-BC 04/23/2014 5:25 PM

## 2014-04-24 LAB — BASIC METABOLIC PANEL
BUN: 11 mg/dL (ref 6–23)
CALCIUM: 9.8 mg/dL (ref 8.4–10.5)
CO2: 15 mEq/L — ABNORMAL LOW (ref 19–32)
CREATININE: 1.73 mg/dL — AB (ref 0.50–1.10)
Chloride: 118 mEq/L — ABNORMAL HIGH (ref 96–112)
GFR calc Af Amer: 36 mL/min — ABNORMAL LOW (ref >90)
GFR calc non Af Amer: 31 mL/min — ABNORMAL LOW (ref >90)
GLUCOSE: 110 mg/dL — AB (ref 70–99)
Potassium: 3.3 mEq/L — ABNORMAL LOW (ref 3.7–5.3)
Sodium: 145 mEq/L (ref 137–147)

## 2014-04-24 NOTE — Progress Notes (Signed)
Subjective: She is much better. She says she slept last night. She is tolerating medications okay so far  Objective: Vital signs in last 24 hours: Temp:  [97.7 F (36.5 C)-98.3 F (36.8 C)] 98.3 F (36.8 C) (05/21 2248) Pulse Rate:  [68-74] 74 (05/21 2248) Resp:  [20] 20 (05/21 2248) BP: (117-136)/(70-76) 136/76 mmHg (05/21 2248) SpO2:  [97 %-98 %] 98 % (05/21 2248) Weight change:  Last BM Date: 04/23/14  Intake/Output from previous day: 05/21 0701 - 05/22 0700 In: 720 [P.O.:120; I.V.:600] Out: 850 [Urine:850]  PHYSICAL EXAM General appearance: alert, cooperative and mild distress Resp: clear to auscultation bilaterally Cardio: regular rate and rhythm, S1, S2 normal, no murmur, click, rub or gallop GI: soft, non-tender; bowel sounds normal; no masses,  no organomegaly Extremities: Trace edema  Lab Results:    Basic Metabolic Panel:  Recent Labs  04/22/14 0643 04/23/14 0624  NA 148* 148*  K 3.9 3.6*  CL 122* 123*  CO2 13* 13*  GLUCOSE 101* 112*  BUN 17 12  CREATININE 1.68* 1.62*  CALCIUM 10.1 9.7   Liver Function Tests: No results found for this basename: AST, ALT, ALKPHOS, BILITOT, PROT, ALBUMIN,  in the last 72 hours No results found for this basename: LIPASE, AMYLASE,  in the last 72 hours No results found for this basename: AMMONIA,  in the last 72 hours CBC: No results found for this basename: WBC, NEUTROABS, HGB, HCT, MCV, PLT,  in the last 72 hours Cardiac Enzymes: No results found for this basename: CKTOTAL, CKMB, CKMBINDEX, TROPONINI,  in the last 72 hours BNP: No results found for this basename: PROBNP,  in the last 72 hours D-Dimer: No results found for this basename: DDIMER,  in the last 72 hours CBG:  Recent Labs  04/22/14 0813  GLUCAP 93   Hemoglobin A1C: No results found for this basename: HGBA1C,  in the last 72 hours Fasting Lipid Panel: No results found for this basename: CHOL, HDL, LDLCALC, TRIG, CHOLHDL, LDLDIRECT,  in the last  72 hours Thyroid Function Tests: No results found for this basename: TSH, T4TOTAL, FREET4, T3FREE, THYROIDAB,  in the last 72 hours Anemia Panel: No results found for this basename: VITAMINB12, FOLATE, FERRITIN, TIBC, IRON, RETICCTPCT,  in the last 72 hours Coagulation: No results found for this basename: LABPROT, INR,  in the last 72 hours Urine Drug Screen: Drugs of Abuse  No results found for this basename: labopia, cocainscrnur, labbenz, amphetmu, thcu, labbarb    Alcohol Level: No results found for this basename: ETH,  in the last 72 hours Urinalysis: No results found for this basename: COLORURINE, APPERANCEUR, LABSPEC, PHURINE, GLUCOSEU, HGBUR, BILIRUBINUR, KETONESUR, PROTEINUR, UROBILINOGEN, NITRITE, LEUKOCYTESUR,  in the last 72 hours Misc. Labs:  ABGS No results found for this basename: PHART, PCO2, PO2ART, TCO2, HCO3,  in the last 72 hours CULTURES Recent Results (from the past 240 hour(s))  MRSA PCR SCREENING     Status: None   Collection Time    04/19/14  9:15 PM      Result Value Ref Range Status   MRSA by PCR NEGATIVE  NEGATIVE Final   Comment:            The GeneXpert MRSA Assay (FDA     approved for NASAL specimens     only), is one component of a     comprehensive MRSA colonization     surveillance program. It is not     intended to diagnose MRSA  infection nor to guide or     monitor treatment for     MRSA infections.   Studies/Results: No results found.  Medications:  Prior to Admission:  Prescriptions prior to admission  Medication Sig Dispense Refill  . acetaminophen (TYLENOL) 500 MG tablet Take 500 mg by mouth every 6 (six) hours as needed for mild pain.      Marland Kitchen albuterol (PROVENTIL HFA;VENTOLIN HFA) 108 (90 BASE) MCG/ACT inhaler Inhale 2 puffs into the lungs 2 (two) times daily.      Marland Kitchen allopurinol (ZYLOPRIM) 300 MG tablet Take 300 mg by mouth at bedtime.       Marland Kitchen amLODipine (NORVASC) 5 MG tablet Take 5 mg by mouth at bedtime.       .  diphenhydrAMINE (SOMINEX) 25 MG tablet Take 25 mg by mouth at bedtime as needed for sleep.       Marland Kitchen lisinopril (PRINIVIL,ZESTRIL) 20 MG tablet Take 20 mg by mouth daily.      Marland Kitchen lithium carbonate (ESKALITH) 450 MG CR tablet Take 450 mg by mouth at bedtime.      . temazepam (RESTORIL) 30 MG capsule Take 30 mg by mouth at bedtime as needed for sleep.      Marland Kitchen thiothixene (NAVANE) 2 MG capsule Take 2 mg by mouth at bedtime.        Scheduled: . allopurinol  300 mg Oral QHS  . citalopram  10 mg Oral Daily  . heparin  5,000 Units Subcutaneous 3 times per day  . lamoTRIgine  25 mg Oral BID  . lamoTRIgine  50 mg Oral BID  . pneumococcal 23 valent vaccine  0.5 mL Intramuscular Tomorrow-1000  . sodium bicarbonate  650 mg Oral TID  . sodium chloride  3 mL Intravenous Q12H  . thiothixene  2 mg Oral QHS   Continuous: . sodium chloride 50 mL/hr at 04/24/14 0530   [id number], albuterol, colchicine, LORazepam, ondansetron (ZOFRAN) IV, ondansetron, polyvinyl alcohol  Assesment: She came to the hospital with acute renal failure. I think she was dehydrated as well. She had hypotension. She has responded well to fluids. She has chronic bipolar disease and had been on lithium which has been discontinued. She had some trouble with aggressive behavior and had psychiatry consultation by television. Her medications have been adjusted Active Problems:   Hypertension   Bipolar disease, chronic   Acute renal failure   Hypotension   Bipolar disorder, unspecified   ARF (acute renal failure)    Plan: She will have further medication adjustment today she will have basic metabolic profile today and probably be able to be discharged tomorrow    LOS: 5 days   Alonza Bogus 04/24/2014, 8:43 AM

## 2014-04-25 DIAGNOSIS — T56891A Toxic effect of other metals, accidental (unintentional), initial encounter: Secondary | ICD-10-CM | POA: Diagnosis present

## 2014-04-25 DIAGNOSIS — E860 Dehydration: Secondary | ICD-10-CM

## 2014-04-25 HISTORY — DX: Dehydration: E86.0

## 2014-04-25 MED ORDER — COLCHICINE 0.6 MG PO TABS: 0.6000 mg | ORAL_TABLET | Freq: Two times a day (BID) | ORAL | Status: DC | PRN

## 2014-04-25 MED ORDER — CITALOPRAM HYDROBROMIDE 10 MG PO TABS: 10.0000 mg | ORAL_TABLET | Freq: Every day | ORAL | Status: DC

## 2014-04-25 MED ORDER — SODIUM BICARBONATE 650 MG PO TABS: 650.0000 mg | ORAL_TABLET | Freq: Three times a day (TID) | ORAL | Status: DC

## 2014-04-25 MED ORDER — LAMOTRIGINE 100 MG PO TABS: 50.0000 mg | ORAL_TABLET | Freq: Two times a day (BID) | ORAL | Status: DC

## 2014-04-25 NOTE — Discharge Summary (Signed)
Physician Discharge Summary  Patient ID: Samantha Conley MRN: [id number] DOB/AGE: 1951/06/08 63 y.o. Primary Care Physician:Burnell Matlin L, MD Admit date: 04/19/2014 Discharge date: 04/25/2014    Discharge Diagnoses:   Active Problems:   Hypertension   Bipolar disease, chronic   Acute renal failure   Hypotension   Bipolar disorder, unspecified   ARF (acute renal failure)   Dehydration   Lithium toxicity     Medication List    STOP taking these medications       lisinopril 20 MG tablet  Commonly known as:  PRINIVIL,ZESTRIL     lithium carbonate 450 MG CR tablet  Commonly known as:  ESKALITH      TAKE these medications       acetaminophen 500 MG tablet  Commonly known as:  TYLENOL  Take 500 mg by mouth every 6 (six) hours as needed for mild pain.     albuterol 108 (90 BASE) MCG/ACT inhaler  Commonly known as:  PROVENTIL HFA;VENTOLIN HFA  Inhale 2 puffs into the lungs 2 (two) times daily.     allopurinol 300 MG tablet  Commonly known as:  ZYLOPRIM  Take 300 mg by mouth at bedtime.     amLODipine 5 MG tablet  Commonly known as:  NORVASC  Take 5 mg by mouth at bedtime.     citalopram 10 MG tablet  Commonly known as:  CELEXA  Take 1 tablet (10 mg total) by mouth daily.     colchicine 0.6 MG tablet  Take 1 tablet (0.6 mg total) by mouth 2 (two) times daily as needed (gout).     diphenhydrAMINE 25 MG tablet  Commonly known as:  SOMINEX  Take 25 mg by mouth at bedtime as needed for sleep.     lamoTRIgine 100 MG tablet  Commonly known as:  LAMICTAL  Take 0.5 tablets (50 mg total) by mouth 2 (two) times daily.     sodium bicarbonate 650 MG tablet  Take 1 tablet (650 mg total) by mouth 3 (three) times daily.     temazepam 30 MG capsule  Commonly known as:  RESTORIL  Take 30 mg by mouth at bedtime as needed for sleep.     thiothixene 2 MG capsule  Commonly known as:  NAVANE  Take 2 mg by mouth at bedtime.        Discharged Condition:  Improved    Consults: Psychiatry  Significant Diagnostic Studies: No results found.  Lab Results: Basic Metabolic Panel:  Recent Labs  04/23/14 0624 04/24/14 0840  NA 148* 145  K 3.6* 3.3*  CL 123* 118*  CO2 13* 15*  GLUCOSE 112* 110*  BUN 12 11  CREATININE 1.62* 1.73*  CALCIUM 9.7 9.8   Liver Function Tests: No results found for this basename: AST, ALT, ALKPHOS, BILITOT, PROT, ALBUMIN,  in the last 72 hours   CBC: No results found for this basename: WBC, NEUTROABS, HGB, HCT, MCV, PLT,  in the last 72 hours  Recent Results (from the past 240 hour(s))  MRSA PCR SCREENING     Status: None   Collection Time    04/19/14  9:15 PM      Result Value Ref Range Status   MRSA by PCR NEGATIVE  NEGATIVE Final   Comment:            The GeneXpert MRSA Assay (FDA     approved for NASAL specimens     only), is one component of a     comprehensive  MRSA colonization     surveillance program. It is not     intended to diagnose MRSA     infection nor to guide or     monitor treatment for     MRSA infections.     Hospital Course: She came to the hospital with weakness. She had been in the hospital with an episode of Salmonella in her stool with acute severe dehydration acute gastroenteritis renal insufficiency and lithium toxicity. She had gone home and was doing better. She came to my office about 2 days prior to admission and had laboratory work did show that she was developing acute renal insufficiency again. Her lithium was discontinued she was told to try to increase her fluids. We discussed hospitalization at that time and she refused. However she did not improve and eventually came to the emergency department as noted. She was treated with IV fluids lithium was held and sodium bicarbonate was added because she appeared to have a renal tubular acidosis likely related to lithium toxicity. She had problems with confusion and paranoia which was felt to be due to being out of her  normal environment. Televised consultation with psychiatry was obtained and medications were altered. Her blood pressure medications are being held because of her renal dysfunction. She is still paranoid but not felt to need inpatient psychiatric care.  Discharge Exam: Blood pressure 146/78, pulse 84, temperature 98.7 F (37.1 C), temperature source Oral, resp. rate 20, height 5' 4.5" (1.638 m), weight 80.105 kg (176 lb 9.6 oz), SpO2 97.00%. She is awake and alert. She is still somewhat paranoid. Her chest is clear. She has trace edema of her legs  Disposition: Home. She refuses home health services. She will have laboratory work in 3 days to document her renal function is holding at home. She was told to increase her fluid intake. She will have outpatient psychiatric followup. She is high risk of return because of her mental illness      Signed: Alonza Bogus   04/25/2014, 10:04 AM

## 2014-04-25 NOTE — Progress Notes (Signed)
Subjective: She remained somewhat paranoid. She says she slept better last night. She still has some aggressive behavior. I think some of this is related to being out of her normal environment  Objective: Vital signs in last 24 hours: Temp:  [98 F (36.7 C)-98.7 F (37.1 C)] 98.7 F (37.1 C) (05/23 0601) Pulse Rate:  [64-84] 84 (05/23 0601) Resp:  [18-20] 20 (05/23 0601) BP: (127-146)/(64-78) 146/78 mmHg (05/23 0601) SpO2:  [97 %] 97 % (05/23 0601) Weight change:  Last BM Date: 04/23/14  Intake/Output from previous day: 05/22 0701 - 05/23 0700 In: 1150 [P.O.:600; I.V.:550] Out: 1151 [Urine:1150; Stool:1]  PHYSICAL EXAM General appearance: alert, cooperative and mild distress Resp: clear to auscultation bilaterally Cardio: regular rate and rhythm, S1, S2 normal, no murmur, click, rub or gallop GI: soft, non-tender; bowel sounds normal; no masses,  no organomegaly Extremities: Trace edema bilaterally  Lab Results:    Basic Metabolic Panel:  Recent Labs  04/23/14 0624 04/24/14 0840  NA 148* 145  K 3.6* 3.3*  CL 123* 118*  CO2 13* 15*  GLUCOSE 112* 110*  BUN 12 11  CREATININE 1.62* 1.73*  CALCIUM 9.7 9.8   Liver Function Tests: No results found for this basename: AST, ALT, ALKPHOS, BILITOT, PROT, ALBUMIN,  in the last 72 hours No results found for this basename: LIPASE, AMYLASE,  in the last 72 hours No results found for this basename: AMMONIA,  in the last 72 hours CBC: No results found for this basename: WBC, NEUTROABS, HGB, HCT, MCV, PLT,  in the last 72 hours Cardiac Enzymes: No results found for this basename: CKTOTAL, CKMB, CKMBINDEX, TROPONINI,  in the last 72 hours BNP: No results found for this basename: PROBNP,  in the last 72 hours D-Dimer: No results found for this basename: DDIMER,  in the last 72 hours CBG: No results found for this basename: GLUCAP,  in the last 72 hours Hemoglobin A1C: No results found for this basename: HGBA1C,  in the last  72 hours Fasting Lipid Panel: No results found for this basename: CHOL, HDL, LDLCALC, TRIG, CHOLHDL, LDLDIRECT,  in the last 72 hours Thyroid Function Tests: No results found for this basename: TSH, T4TOTAL, FREET4, T3FREE, THYROIDAB,  in the last 72 hours Anemia Panel: No results found for this basename: VITAMINB12, FOLATE, FERRITIN, TIBC, IRON, RETICCTPCT,  in the last 72 hours Coagulation: No results found for this basename: LABPROT, INR,  in the last 72 hours Urine Drug Screen: Drugs of Abuse  No results found for this basename: labopia, cocainscrnur, labbenz, amphetmu, thcu, labbarb    Alcohol Level: No results found for this basename: ETH,  in the last 72 hours Urinalysis: No results found for this basename: COLORURINE, APPERANCEUR, LABSPEC, PHURINE, GLUCOSEU, HGBUR, BILIRUBINUR, KETONESUR, PROTEINUR, UROBILINOGEN, NITRITE, LEUKOCYTESUR,  in the last 72 hours Misc. Labs:  ABGS No results found for this basename: PHART, PCO2, PO2ART, TCO2, HCO3,  in the last 72 hours CULTURES Recent Results (from the past 240 hour(s))  MRSA PCR SCREENING     Status: None   Collection Time    04/19/14  9:15 PM      Result Value Ref Range Status   MRSA by PCR NEGATIVE  NEGATIVE Final   Comment:            The GeneXpert MRSA Assay (FDA     approved for NASAL specimens     only), is one component of a     comprehensive MRSA colonization  surveillance program. It is not     intended to diagnose MRSA     infection nor to guide or     monitor treatment for     MRSA infections.   Studies/Results: No results found.  Medications:  Prior to Admission:  No prescriptions prior to admission   Scheduled: . allopurinol  300 mg Oral QHS  . citalopram  10 mg Oral Daily  . heparin  5,000 Units Subcutaneous 3 times per day  . lamoTRIgine  50 mg Oral BID  . pneumococcal 23 valent vaccine  0.5 mL Intramuscular Tomorrow-1000  . sodium bicarbonate  650 mg Oral TID  . sodium chloride  3 mL  Intravenous Q12H  . thiothixene  2 mg Oral QHS   Continuous: . sodium chloride 50 mL/hr at 04/24/14 0530   [id number], albuterol, colchicine, LORazepam, ondansetron (ZOFRAN) IV, ondansetron, polyvinyl alcohol  Assesment: She was admitted with acute renal failure. She was dehydrated. She has some edema after fluid resuscitation. She was lithium toxic. She is better with that but is having more problem with her known psychotic bipolar disease. She is not felt to need inpatient psychiatric care. She is better as far as her acute renal failure dehydration etc. and her lithium level had come down to normal and she's not going to be placed back on lithium. She is ready for discharge. It is felt that her mental status may improve when she gets back in her normal environment Active Problems:   Hypertension   Bipolar disease, chronic   Acute renal failure   Hypotension   Bipolar disorder, unspecified   ARF (acute renal failure)    Plan: She will be discharged home. Medication changes have been made. She will have laboratory work done on the 26th to be sure that her renal function is stable.    LOS: 6 days   Alonza Bogus 04/25/2014, 10:01 AM

## 2014-04-25 NOTE — Consult Note (Signed)
Case discussed, patient denies any suicidal or homicidal ideation, does agree that she was aggressive with nursing but reports having had poor sleep the night before. Patient does not need inpatient psychiatric treatment

## 2014-04-25 NOTE — Progress Notes (Signed)
At 4:50am the patient came out of her room and went to the public restroom in the hallway. The patient became agitated and started yelling at staff "get that stuff out of my room now." I escorted the patient back towards her room and she would not go in and stated she was not going in until everyone left her room. The patient became combative and belligerent and told me I could not turn her bed alarm back on. I told the patient she was not steady on her feet, and explained the risks of her not having the bed alarm on. The patient refused the alarm and closed her door.

## 2014-04-25 NOTE — Progress Notes (Signed)
Pt discharged home today per Dr. Luan Pulling. Pt's IV site D/C'd and WNL. Pt's VSS. Pt provided with home medication list, discharge instructions and prescriptions. Verbalized understanding. Pt left floor via WC in stable condition accompanied by NT.

## 2014-06-22 ENCOUNTER — Other Ambulatory Visit (HOSPITAL_COMMUNITY): Payer: Self-pay | Admitting: Psychiatry

## 2014-06-29 ENCOUNTER — Other Ambulatory Visit: Payer: Self-pay | Admitting: Oral Surgery

## 2014-08-31 ENCOUNTER — Ambulatory Visit (HOSPITAL_COMMUNITY): Payer: Self-pay | Admitting: Psychiatry

## 2014-11-25 ENCOUNTER — Other Ambulatory Visit: Payer: Self-pay | Admitting: Physician Assistant

## 2014-11-29 ENCOUNTER — Emergency Department (HOSPITAL_COMMUNITY)
Admission: EM | Admit: 2014-11-29 | Discharge: 2014-11-29 | Disposition: A | Discharge status: Home / Self Care | Payer: 59 | Attending: Emergency Medicine | Admitting: Emergency Medicine

## 2014-11-29 ENCOUNTER — Encounter (HOSPITAL_COMMUNITY): Payer: Self-pay | Admitting: Emergency Medicine

## 2014-11-29 DIAGNOSIS — J45909 Unspecified asthma, uncomplicated: Secondary | ICD-10-CM | POA: Diagnosis not present

## 2014-11-29 DIAGNOSIS — Z79899 Other long term (current) drug therapy: Secondary | ICD-10-CM | POA: Diagnosis not present

## 2014-11-29 DIAGNOSIS — F419 Anxiety disorder, unspecified: Secondary | ICD-10-CM | POA: Diagnosis not present

## 2014-11-29 DIAGNOSIS — G4700 Insomnia, unspecified: Secondary | ICD-10-CM | POA: Diagnosis present

## 2014-11-29 DIAGNOSIS — Z880 Allergy status to penicillin: Secondary | ICD-10-CM | POA: Insufficient documentation

## 2014-11-29 DIAGNOSIS — I10 Essential (primary) hypertension: Secondary | ICD-10-CM | POA: Diagnosis not present

## 2014-11-29 DIAGNOSIS — M109 Gout, unspecified: Secondary | ICD-10-CM | POA: Insufficient documentation

## 2014-11-29 LAB — COMPREHENSIVE METABOLIC PANEL
ALT: 14 U/L (ref 0–35)
ANION GAP: 7 (ref 5–15)
AST: 18 U/L (ref 0–37)
Albumin: 4 g/dL (ref 3.5–5.2)
Alkaline Phosphatase: 61 U/L (ref 39–117)
BUN: 8 mg/dL (ref 6–23)
CALCIUM: 9.7 mg/dL (ref 8.4–10.5)
CHLORIDE: 111 meq/L (ref 96–112)
CO2: 26 mmol/L (ref 19–32)
CREATININE: 1.23 mg/dL — AB (ref 0.50–1.10)
GFR calc Af Amer: 53 mL/min — ABNORMAL LOW (ref >90)
GFR calc non Af Amer: 46 mL/min — ABNORMAL LOW (ref >90)
Glucose, Bld: 111 mg/dL — ABNORMAL HIGH (ref 70–99)
Potassium: 3.6 mmol/L (ref 3.5–5.1)
Sodium: 144 mmol/L (ref 135–145)
TOTAL PROTEIN: 6.5 g/dL (ref 6.0–8.3)
Total Bilirubin: 0.6 mg/dL (ref 0.3–1.2)

## 2014-11-29 LAB — CBC WITH DIFFERENTIAL/PLATELET
BASOS ABS: 0 10*3/uL (ref 0.0–0.1)
BASOS PCT: 1 % (ref 0–1)
EOS ABS: 0.1 10*3/uL (ref 0.0–0.7)
Eosinophils Relative: 2 % (ref 0–5)
HEMATOCRIT: 37.8 % (ref 36.0–46.0)
Hemoglobin: 12.6 g/dL (ref 12.0–15.0)
Lymphocytes Relative: 40 % (ref 12–46)
Lymphs Abs: 2.4 10*3/uL (ref 0.7–4.0)
MCH: 30.6 pg (ref 26.0–34.0)
MCHC: 33.3 g/dL (ref 30.0–36.0)
MCV: 91.7 fL (ref 78.0–100.0)
MONO ABS: 0.5 10*3/uL (ref 0.1–1.0)
MONOS PCT: 9 % (ref 3–12)
NEUTROS ABS: 3 10*3/uL (ref 1.7–7.7)
Neutrophils Relative %: 50 % (ref 43–77)
Platelets: 205 10*3/uL (ref 150–400)
RBC: 4.12 MIL/uL (ref 3.87–5.11)
RDW: 12.8 % (ref 11.5–15.5)
WBC: 6 10*3/uL (ref 4.0–10.5)

## 2014-11-29 LAB — URINALYSIS, ROUTINE W REFLEX MICROSCOPIC
Bilirubin Urine: NEGATIVE
GLUCOSE, UA: NEGATIVE mg/dL
Hgb urine dipstick: NEGATIVE
KETONES UR: NEGATIVE mg/dL
Leukocytes, UA: NEGATIVE
Nitrite: NEGATIVE
PH: 5.5 (ref 5.0–8.0)
Protein, ur: NEGATIVE mg/dL
Urobilinogen, UA: 0.2 mg/dL (ref 0.0–1.0)

## 2014-11-29 MED ORDER — LORAZEPAM 1 MG PO TABS: 1.0000 mg | ORAL_TABLET | Freq: Three times a day (TID) | ORAL | Status: DC | PRN

## 2014-11-29 NOTE — ED Provider Notes (Signed)
CSN: [id number]     Arrival date & time 11/29/14  [id number] History  This chart was scribed for Sharyon Cable, MD by Stephania Fragmin, ED Scribe. This patient was seen in room APA10/APA10 and the patient's care was started at 8:34 AM.   Chief Complaint  Patient presents with  . Insomnia   The history is provided by the patient. No language interpreter was used.    HPI Comments: Samantha Conley is a 63 y.o. female with a history of Bipolar Disorder who presents to the Emergency Department complaining of chronic insomnia that has worsened in the past few days due to recent viral infection. She states that she is worried she may have a psychotic episode if her insomnia continues. Patient has been on Lithium for 27 years that she stopped this year in May. She endorses constant anxiety. Patient denies trying any OTC medicine for her problem. Her Navane was recently increased, which she believes made her more restless. She denies being on Restoril or Colchicine. She also denies fever, vomiting, chest pain, SOB, abdominal pain, dysuria, syncope, SI, or HI. Patient lives alone. Her PCP is Dr. Luan Pulling. She has an appointment to see her counselor in about 3 weeks. She states that her kidneys were damaged from the lithium.    Past Medical History  Diagnosis Date  . Hypertension     since 2007  . Gout   . Asthma   . Bipolar disorder     patient denies  . Insomnia    Past Surgical History  Procedure Laterality Date  . Chemical imbalance    . Cholecystectomy    . Total abdominal hysterectomy    . Tonsillectomy    . Nasal sinus surgery     Family History  Problem Relation Age of Onset  . CAD Mother   . CAD Father   . CVA Mother   . Parkinsonism Father    History  Substance Use Topics  . Smoking status: Never Smoker   . Smokeless tobacco: Not on file  . Alcohol Use: No   OB History    No data available     Review of Systems  Constitutional: Negative for fever.  Respiratory: Negative for  shortness of breath.   Cardiovascular: Negative for chest pain.  Gastrointestinal: Negative for vomiting.  Genitourinary: Negative for dysuria.  Neurological: Negative for syncope.  Psychiatric/Behavioral: Positive for sleep disturbance. Negative for suicidal ideas.  All other systems reviewed and are negative.     Allergies  Codeine; Doxycycline; Morphine and related; Penicillins; and Promethazine  Home Medications   Prior to Admission medications   Medication Sig Start Date End Date Taking? Authorizing Provider  acetaminophen (TYLENOL) 500 MG tablet Take 500 mg by mouth every 6 (six) hours as needed for mild pain.    Historical Provider, MD  albuterol (PROVENTIL HFA;VENTOLIN HFA) 108 (90 BASE) MCG/ACT inhaler Inhale 2 puffs into the lungs 2 (two) times daily.    Historical Provider, MD  allopurinol (ZYLOPRIM) 300 MG tablet Take 300 mg by mouth at bedtime.     Historical Provider, MD  amLODipine (NORVASC) 5 MG tablet Take 5 mg by mouth at bedtime.     Historical Provider, MD  citalopram (CELEXA) 10 MG tablet Take 1 tablet (10 mg total) by mouth daily. Patient taking differently: Take 5 mg by mouth daily.  04/25/14   Alonza Bogus, MD  LORazepam (ATIVAN) 1 MG tablet Take 1 tablet (1 mg total) by mouth 3 (three) times  daily as needed for anxiety. 11/29/14   Sharyon Cable, MD  sodium bicarbonate 650 MG tablet Take 1 tablet (650 mg total) by mouth 3 (three) times daily. 04/25/14   Alonza Bogus, MD  thiothixene (NAVANE) 2 MG capsule Take 10 mg by mouth at bedtime.     Historical Provider, MD   BP 127/63 mmHg  Pulse 64  Temp(Src) 97.9 F (36.6 C) (Oral)  Resp 16  Ht 5\' 4"  (1.626 m)  Wt 154 lb (69.854 kg)  BMI 26.42 kg/m2  SpO2 100% Physical Exam  Nursing note and vitals reviewed.   CONSTITUTIONAL: Well developed/well nourished, mildly anxious HEAD: Normocephalic/atraumatic EYES: EOMI/PERRL ENMT: Mucous membranes moist NECK: supple no meningeal signs SPINE/BACK:entire  spine nontender CV: S1/S2 noted, no murmurs/rubs/gallops noted LUNGS: Lungs are clear to auscultation bilaterally, no apparent distress ABDOMEN: soft, nontender, no rebound or guarding, bowel sounds noted throughout abdomen GU:no cva tenderness NEURO: Pt is awake/alert/appropriate, moves all extremitiesx4.  No facial droop.   EXTREMITIES: pulses normal/equal, full ROM SKIN: warm, color normal PSYCH: mildly anxious  ED Course  Procedures   DIAGNOSTIC STUDIES: Oxygen Saturation is 100% on room air, normal by my interpretation.    COORDINATION OF CARE: 8:35 AM - Discussed treatment plan with pt at bedside which includes short course of Ativan and f/u with PCP and pt agreed to plan.    Short course of ativan given for outpatient use and advised need for PCP followup Labs Review Labs Reviewed  COMPREHENSIVE METABOLIC PANEL - Abnormal; Notable for the following:    Glucose, Bld 111 (*)    Creatinine, Ser 1.23 (*)    GFR calc non Af Amer 46 (*)    GFR calc Af Amer 53 (*)    All other components within normal limits  URINALYSIS, ROUTINE W REFLEX MICROSCOPIC - Abnormal; Notable for the following:    Specific Gravity, Urine <1.005 (*)    All other components within normal limits  CBC WITH DIFFERENTIAL     EKG Interpretation   Date/Time:  Sunday November 29 2014 07:36:34 EST Ventricular Rate:  53 PR Interval:  179 QRS Duration: 73 QT Interval:  424 QTC Calculation: 398 R Axis:   30 Text Interpretation:  Sinus rhythm Low voltage, precordial leads  Borderline T abnormalities, anterior leads Baseline wander in lead(s) II  III aVF No significant change since last tracing Confirmed by Dudley ([id number]) on 11/29/2014 7:52:18 AM      MDM   Final diagnoses:  Insomnia    I personally performed the services described in this documentation, which was scribed in my presence. The recorded information has been reviewed and is accurate.     Sharyon Cable,  MD 11/29/14 [phone]

## 2014-11-29 NOTE — ED Notes (Signed)
Patient reports has had trouble sleeping here recently states has not gotten any sleep tonight. States she is afraid she may have a psychotic episode soon if she doesn't get her insomnia under control. Reports was taken off of lithium in May. Also reports Thiothixene has been increased to 10 mg on the 16th of December. Patient reports that she believes that medication makes her restless.

## 2014-11-29 NOTE — ED Notes (Signed)
Patient states she has had trouble sleeping for the past 2 months. Patient reports not having any sleep last night. Patient states she feels like recent medication changes are causing her not to be able to sleep. At this time patient is A&OX4 and appears anxious.

## 2014-11-29 NOTE — Discharge Instructions (Signed)
Insomnia Insomnia is frequent trouble falling and/or staying asleep. Insomnia can be a long term problem or a short term problem. Both are common. Insomnia can be a short term problem when the wakefulness is related to a certain stress or worry. Long term insomnia is often related to ongoing stress during waking hours and/or poor sleeping habits. Overtime, sleep deprivation itself can make the problem worse. Every little thing feels more severe because you are overtired and your ability to cope is decreased. CAUSES   Stress, anxiety, and depression.  Poor sleeping habits.  Distractions such as TV in the bedroom.  Naps close to bedtime.  Engaging in emotionally charged conversations before bed.  Technical reading before sleep.  Alcohol and other sedatives. They may make the problem worse. They can hurt normal sleep patterns and normal dream activity.  Stimulants such as caffeine for several hours prior to bedtime.  Pain syndromes and shortness of breath can cause insomnia.  Exercise late at night.  Changing time zones may cause sleeping problems (jet lag). It is sometimes helpful to have someone observe your sleeping patterns. They should look for periods of not breathing during the night (sleep apnea). They should also look to see how long those periods last. If you live alone or observers are uncertain, you can also be observed at a sleep clinic where your sleep patterns will be professionally monitored. Sleep apnea requires a checkup and treatment. Give your caregivers your medical history. Give your caregivers observations your family has made about your sleep.  SYMPTOMS   Not feeling rested in the morning.  Anxiety and restlessness at bedtime.  Difficulty falling and staying asleep. TREATMENT   Your caregiver may prescribe treatment for an underlying medical disorders. Your caregiver can give advice or help if you are using alcohol or other drugs for self-medication. Treatment  of underlying problems will usually eliminate insomnia problems.  Medications can be prescribed for short time use. They are generally not recommended for lengthy use.  Over-the-counter sleep medicines are not recommended for lengthy use. They can be habit forming.  You can promote easier sleeping by making lifestyle changes such as:  Using relaxation techniques that help with breathing and reduce muscle tension.  Exercising earlier in the day.  Changing your diet and the time of your last meal. No night time snacks.  Establish a regular time to go to bed.  Counseling can help with stressful problems and worry.  Soothing music and white noise may be helpful if there are background noises you cannot remove.  Stop tedious detailed work at least one hour before bedtime. HOME CARE INSTRUCTIONS   Keep a diary. Inform your caregiver about your progress. This includes any medication side effects. See your caregiver regularly. Take note of:  Times when you are asleep.  Times when you are awake during the night.  The quality of your sleep.  How you feel the next day. This information will help your caregiver care for you.  Get out of bed if you are still awake after 15 minutes. Read or do some quiet activity. Keep the lights down. Wait until you feel sleepy and go back to bed.  Keep regular sleeping and waking hours. Avoid naps.  Exercise regularly.  Avoid distractions at bedtime. Distractions include watching television or engaging in any intense or detailed activity like attempting to balance the household checkbook.  Develop a bedtime ritual. Keep a familiar routine of bathing, brushing your teeth, climbing into bed at the same   time each night, listening to soothing music. Routines increase the success of falling to sleep faster.  Use relaxation techniques. This can be using breathing and muscle tension release routines. It can also include visualizing peaceful scenes. You can  also help control troubling or intruding thoughts by keeping your mind occupied with boring or repetitive thoughts like the old concept of counting sheep. You can make it more creative like imagining planting one beautiful flower after another in your backyard garden.  During your day, work to eliminate stress. When this is not possible use some of the previous suggestions to help reduce the anxiety that accompanies stressful situations. MAKE SURE YOU:   Understand these instructions.  Will watch your condition.  Will get help right away if you are not doing well or get worse. Document Released: 11/17/2000 Document Revised: 02/12/2012 Document Reviewed: 12/18/2007 Exit[employer] Patient Information 2015 [employer]. This information is not intended to replace advice given to you by your health care provider. Make sure you discuss any questions you have with your health care provider.  

## 2014-11-29 NOTE — ED Notes (Signed)
Pt states she is unable to urinate at this time.

## 2014-12-08 ENCOUNTER — Other Ambulatory Visit: Payer: Self-pay

## 2014-12-08 DIAGNOSIS — Z1231 Encounter for screening mammogram for malignant neoplasm of breast: Secondary | ICD-10-CM

## 2015-01-01 ENCOUNTER — Other Ambulatory Visit: Payer: Self-pay

## 2015-01-01 ENCOUNTER — Ambulatory Visit
Admission: RE | Admit: 2015-01-01 | Discharge: 2015-01-01 | Disposition: A | Discharge status: Home / Self Care | Payer: 59 | Source: Ambulatory Visit

## 2015-01-01 DIAGNOSIS — Z1231 Encounter for screening mammogram for malignant neoplasm of breast: Secondary | ICD-10-CM

## 2015-03-30 ENCOUNTER — Other Ambulatory Visit (HOSPITAL_COMMUNITY): Payer: Self-pay | Admitting: Pulmonary Disease

## 2015-03-30 DIAGNOSIS — Z780 Asymptomatic menopausal state: Secondary | ICD-10-CM

## 2015-04-01 ENCOUNTER — Inpatient Hospital Stay (HOSPITAL_COMMUNITY): Admission: RE | Admit: 2015-04-01 | Payer: 59 | Source: Ambulatory Visit

## 2015-04-02 ENCOUNTER — Other Ambulatory Visit (HOSPITAL_COMMUNITY): Payer: 59

## 2015-05-07 ENCOUNTER — Ambulatory Visit (INDEPENDENT_AMBULATORY_CARE_PROVIDER_SITE_OTHER): Payer: 59 | Admitting: Neurology

## 2015-05-07 ENCOUNTER — Other Ambulatory Visit (INDEPENDENT_AMBULATORY_CARE_PROVIDER_SITE_OTHER): Payer: 59

## 2015-05-07 ENCOUNTER — Encounter: Payer: Self-pay | Admitting: Neurology

## 2015-05-07 VITALS — BP 104/60 | HR 80 | Ht 64.5 in | Wt 166.0 lb

## 2015-05-07 DIAGNOSIS — G2402 Drug induced acute dystonia: Secondary | ICD-10-CM

## 2015-05-07 DIAGNOSIS — G629 Polyneuropathy, unspecified: Secondary | ICD-10-CM

## 2015-05-07 DIAGNOSIS — T43505A Adverse effect of unspecified antipsychotics and neuroleptics, initial encounter: Secondary | ICD-10-CM

## 2015-05-07 DIAGNOSIS — R739 Hyperglycemia, unspecified: Secondary | ICD-10-CM | POA: Diagnosis not present

## 2015-05-07 DIAGNOSIS — G2401 Drug induced subacute dyskinesia: Secondary | ICD-10-CM

## 2015-05-07 LAB — HEMOGLOBIN A1C: Hgb A1c MFr Bld: 5.5 % (ref 4.6–6.5)

## 2015-05-07 LAB — TSH: TSH: 0.97 u[IU]/mL (ref 0.35–4.50)

## 2015-05-07 NOTE — Progress Notes (Signed)
Samantha Conley was seen today in the movement disorders clinic for neurologic consultation at the request of HAWKINS,EDWARD L, MD.  The consultation is for the evaluation of gait instability.  The records that were made available to me were reviewed.  Pt was in the hospital about a year ago after a fall and for lithium toxicity.  She was taken off of it after that hospitalization (had acute renal failure).  Pt states that the issue now is that she cannot walk in a straight line.  She used to seem that she veers to the left, but now she will "veer to the right" as well.  She states it has been going on at least since Jan, 2016.  She states that she has been placed on VPA, doxepin and celexa within the last year and she wonders if her trouble walking isn't related to these medications (or one of them).  Pt was on thiothixene and states that she was taken off of it in December.  She states that tremor got better after this medication was discontinued.  Specific Symptoms:  Tremor: Yes.  , but much better since navane d/c Voice: no change in strength Sleep: trouble getting to sleep and states that is why on some of her meds  Vivid Dreams:  Yes.    Acting out dreams:  No. Wet Pillows: No. Postural symptoms:  Yes.  , mild (on stairs)  Falls?  Yes.   (only when lithium toxic) Bradykinesia symptoms: difficulty getting out of a chair Loss of smell:  No. Loss of taste:  No. Urinary Incontinence:  Yes.   (little better since lithium d/c; only stress incontinence) Difficulty Swallowing:  No. Handwriting, micrographia: No. Trouble with ADL's:  Yes.   (minimal - sits to put down on pants)  Trouble buttoning clothing: No. Depression:  No. (none now) Memory changes:  Yes.   (short term) Hallucinations:  Yes.   (only when coming off lithium but otherwise no)  visual distortions: Yes.   N/V:  No. Lightheaded:  No.  Syncope: No. Diplopia:  No. Dyskinesia:  No.  Paresthesias:  None in the feet but  some in the hands bilaterally    ALLERGIES:   Allergies  Allergen Reactions  . Codeine Other (See Comments)    Makes states it makes her "spacey"  . Cortisone   . Doxycycline     Severe diarrhea  . Morphine And Related Nausea And Vomiting  . Penicillins Other (See Comments)    unknown  . Promethazine Other (See Comments)    Patient states she is not allergic but it makes her  "spacey and not in control"    CURRENT MEDICATIONS:  Outpatient Encounter Prescriptions as of 05/07/2015  Medication Sig  . acetaminophen (TYLENOL) 500 MG tablet Take 500 mg by mouth every 6 (six) hours as needed for mild pain.  Marland Kitchen albuterol (PROVENTIL HFA;VENTOLIN HFA) 108 (90 BASE) MCG/ACT inhaler Inhale 2 puffs into the lungs 2 (two) times daily.  Marland Kitchen allopurinol (ZYLOPRIM) 300 MG tablet Take 300 mg by mouth at bedtime.   Marland Kitchen amLODipine (NORVASC) 5 MG tablet Take 5 mg by mouth at bedtime.   . citalopram (CELEXA) 10 MG tablet Take 1 tablet (10 mg total) by mouth daily. (Patient taking differently: Take 5 mg by mouth daily. )  . divalproex (DEPAKOTE ER) 250 MG 24 hr tablet Take 250 mg by mouth 2 (two) times daily.  Marland Kitchen doxepin (SINEQUAN) 50 MG capsule Take 50 mg by mouth  at bedtime.  . [DISCONTINUED] LORazepam (ATIVAN) 1 MG tablet Take 1 tablet (1 mg total) by mouth 3 (three) times daily as needed for anxiety.  . [DISCONTINUED] sodium bicarbonate 650 MG tablet Take 1 tablet (650 mg total) by mouth 3 (three) times daily.  . [DISCONTINUED] thiothixene (NAVANE) 2 MG capsule Take 10 mg by mouth at bedtime.    No facility-administered encounter medications on file as of 05/07/2015.    PAST MEDICAL HISTORY:   Past Medical History  Diagnosis Date  . Hypertension     since 2007  . Gout   . Bipolar disorder     patient denies  . Insomnia   . Lithium toxicity   . Acute renal failure     lithium toxicity    PAST SURGICAL HISTORY:   Past Surgical History  Procedure Laterality Date  . Chemical imbalance    .  Cholecystectomy    . Total abdominal hysterectomy    . Tonsillectomy    . Nasal sinus surgery  2014  . Cataract extraction, bilateral      lens implant bilaterally    SOCIAL HISTORY:   History   Social History  . Marital Status: Single    Spouse Name: N/A  . Number of Children: N/A  . Years of Education: N/A   Occupational History  . Not on file.   Social History Main Topics  . Smoking status: Never Smoker   . Smokeless tobacco: Not on file  . Alcohol Use: No  . Drug Use: No  . Sexual Activity: Not on file   Other Topics Concern  . 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      FAMILY HISTORY:   Family Status  Relation Status Death Age  . Mother Deceased     stroke, heart disease  . Father Deceased     parkinson's, heart disease  . Brother Alive     prostate cancer  . Brother Alive     healthy    ROS:  A complete 10 system review of systems was obtained and was unremarkable apart from what is mentioned above.  PHYSICAL EXAMINATION:    VITALS:   Filed Vitals:   05/07/15 1233  BP: 104/60  Pulse: 80  Height: 5' 4.5" (1.638 m)  Weight: 166 lb (75.297 kg)    GEN:  The patient appears stated age and is in NAD. HEENT:  Normocephalic, atraumatic.  The mucous membranes are moist. The superficial temporal arteries are without ropiness or tenderness.  There is orobuccolingual dyskinesia but the tongue does not protrude from the mouth. CV:  RRR Lungs:  CTAB Neck/HEME:  There are no carotid bruits bilaterally.  Neurological examination:  Orientation: The patient is alert and oriented x3. Fund of knowledge is appropriate.  Recent and remote memory are intact.  Attention and concentration are normal.    Able to name objects and repeat phrases. Cranial nerves: There is good facial symmetry. Pupils are equal round and reactive to light bilaterally. Fundoscopic exam reveals clear margins bilaterally. Extraocular muscles are  intact. Mild horizontal nystagmus.  No vertical nystagmus.  The visual fields are full to confrontational testing. The speech is fluent and clear. Soft palate rises symmetrically and there is no tongue deviation. Hearing is intact to conversational tone. Sensation: Sensation is intact to light and pinprick throughout (facial, trunk, extremities).  Pinprick is decreased in a stocking distribution on the left only. Vibration is  absent at the bilateral big toe and decreased at the ankle. There is no extinction with double simultaneous stimulation. There is no sensory dermatomal level identified. Motor: Strength is 5/5 in the bilateral upper and lower extremities.   Shoulder shrug is equal and symmetric.  There is no pronator drift. Deep tendon reflexes: Deep tendon reflexes are 2/4 at the bilateral biceps, triceps, brachioradialis, patella and achilles. Plantar responses are downgoing bilaterally.  Movement examination: Tone: There is normal tone in the bilateral upper extremities.  The tone in the lower extremities is normal.  Abnormal movements: none Coordination:  There is no significant decremation with RAM's, with any form of RAM's, including alternating supination and pronation of the forearm, hand opening and closing, finger taps, heel taps and toe taps. Gait and Station: The patient has no difficulty arising out of a deep-seated chair without the use of the hands. The patient's stride length is normal.  She has no difficulty ambulating in a tandem fashion.  She is able to stand in Romberg position with eyes open and closed.    Labs:    Chemistry      Component Value Date/Time   NA 144 11/29/2014 0804   K 3.6 11/29/2014 0804   CL 111 11/29/2014 0804   CO2 26 11/29/2014 0804   BUN 8 11/29/2014 0804   CREATININE 1.23* 11/29/2014 0804      Component Value Date/Time   CALCIUM 9.7 11/29/2014 0804   ALKPHOS 61 11/29/2014 0804   AST 18 11/29/2014 0804   ALT 14 11/29/2014 0804   BILITOT 0.6  11/29/2014 0804     Lab Results  Component Value Date   WBC 6.0 11/29/2014   HGB 12.6 11/29/2014   HCT 37.8 11/29/2014   MCV 91.7 11/29/2014   PLT 205 11/29/2014   Lab Results  Component Value Date   TSH 2.150 04/19/2014   Lab Results  Component Value Date   VITAMINB12 473 03/13/2014     ASSESSMENT/PLAN:  1. Gait instability  -She does have some clinical examination evidence of a diffuse peripheral neuropathy.  This may be the cause of her sensation of "veering off to the right or left."  She and I discussed safety.  Greater than 50% of the 60 minute visit spent in counseling in this regard.  We opted against EMG since it really does not add anything to treatment.  -We will check lab work to include B12, folate, RPR, TSH, SPEP/UPEP with immunofixation.  We'll add hemoglobin A1c as she did have some hyperglycemia when in the hospital last year.  2.  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  While she has been off of the medication for 6 months, this side effect can be permanent.  Does not seem to bother the patient.  3.  Told her that we will talk with her on the phone about the above results.  We will follow-up with her on an as-needed basis, but encouraged her to call us and make a follow-up if new neurologic issues arise or if things worsen.

## 2015-05-07 NOTE — Patient Instructions (Signed)
1. Your provider has requested that you have labwork completed today. Please go to [facility] on the second floor of this building before leaving the office today.

## 2015-05-08 LAB — RPR

## 2015-05-10 ENCOUNTER — Telehealth: Payer: Self-pay | Admitting: Neurology

## 2015-05-10 LAB — VITAMIN B12: Vitamin B-12: 469 pg/mL (ref 211–911)

## 2015-05-10 LAB — FOLATE: Folate: 17.8 ng/mL (ref >5.9)

## 2015-05-10 NOTE — Telephone Encounter (Signed)
Patient made aware labs that are resulted have been normal. I will call if anything comes back abnormal from remaining unresulted labs.

## 2015-05-10 NOTE — Telephone Encounter (Signed)
Pt wants to know the test result please call [phone] pt states that we may have to leave a message and her call us back

## 2015-05-12 LAB — UIFE/LIGHT CHAINS/TP QN, 24-HR UR
ALPHA 1 UR: DETECTED — AB
ALPHA 2 UR: DETECTED — AB
Albumin, U: DETECTED
BETA UR: DETECTED — AB
GAMMA UR: DETECTED — AB
TOTAL PROTEIN, URINE-UPE24: 16 mg/dL (ref 5–24)

## 2015-05-12 LAB — SPEP & IFE WITH QIG
Albumin ELP: 4.3 g/dL (ref 3.8–4.8)
Alpha-1-Globulin: 0.3 g/dL (ref 0.2–0.3)
Alpha-2-Globulin: 0.8 g/dL (ref 0.5–0.9)
Beta 2: 0.3 g/dL (ref 0.2–0.5)
Beta Globulin: 0.4 g/dL (ref 0.4–0.6)
GAMMA GLOBULIN: 0.6 g/dL — AB (ref 0.8–1.7)
IgA: 158 mg/dL (ref 69–380)
IgG (Immunoglobin G), Serum: 605 mg/dL — ABNORMAL LOW (ref 690–1700)
IgM, Serum: 50 mg/dL — ABNORMAL LOW (ref 52–322)
TOTAL PROTEIN, SERUM ELECTROPHOR: 6.6 g/dL (ref 6.1–8.1)

## 2015-05-18 ENCOUNTER — Ambulatory Visit: Payer: 59 | Admitting: Gastroenterology

## 2015-05-26 ENCOUNTER — Encounter (HOSPITAL_COMMUNITY): Payer: Self-pay | Admitting: Emergency Medicine

## 2015-05-26 ENCOUNTER — Emergency Department (HOSPITAL_COMMUNITY): Payer: 59

## 2015-05-26 ENCOUNTER — Emergency Department (HOSPITAL_COMMUNITY)
Admission: EM | Admit: 2015-05-26 | Discharge: 2015-05-26 | Disposition: A | Discharge status: Home / Self Care | Payer: 59 | Attending: Emergency Medicine | Admitting: Emergency Medicine

## 2015-05-26 DIAGNOSIS — I10 Essential (primary) hypertension: Secondary | ICD-10-CM | POA: Insufficient documentation

## 2015-05-26 DIAGNOSIS — Z880 Allergy status to penicillin: Secondary | ICD-10-CM | POA: Insufficient documentation

## 2015-05-26 DIAGNOSIS — M79601 Pain in right arm: Secondary | ICD-10-CM

## 2015-05-26 DIAGNOSIS — Z87448 Personal history of other diseases of urinary system: Secondary | ICD-10-CM | POA: Diagnosis not present

## 2015-05-26 DIAGNOSIS — R079 Chest pain, unspecified: Secondary | ICD-10-CM

## 2015-05-26 DIAGNOSIS — F319 Bipolar disorder, unspecified: Secondary | ICD-10-CM | POA: Diagnosis not present

## 2015-05-26 DIAGNOSIS — G4700 Insomnia, unspecified: Secondary | ICD-10-CM | POA: Insufficient documentation

## 2015-05-26 DIAGNOSIS — Z79899 Other long term (current) drug therapy: Secondary | ICD-10-CM | POA: Diagnosis not present

## 2015-05-26 DIAGNOSIS — M25511 Pain in right shoulder: Secondary | ICD-10-CM | POA: Diagnosis present

## 2015-05-26 DIAGNOSIS — M109 Gout, unspecified: Secondary | ICD-10-CM | POA: Diagnosis not present

## 2015-05-26 DIAGNOSIS — Z87828 Personal history of other (healed) physical injury and trauma: Secondary | ICD-10-CM | POA: Diagnosis not present

## 2015-05-26 LAB — CBC WITH DIFFERENTIAL/PLATELET
Basophils Absolute: 0 10*3/uL (ref 0.0–0.1)
Basophils Relative: 1 % (ref 0–1)
Eosinophils Absolute: 0.1 10*3/uL (ref 0.0–0.7)
Eosinophils Relative: 2 % (ref 0–5)
HCT: 38.6 % (ref 36.0–46.0)
Hemoglobin: 12.9 g/dL (ref 12.0–15.0)
Lymphocytes Relative: 41 % (ref 12–46)
Lymphs Abs: 1.9 10*3/uL (ref 0.7–4.0)
MCH: 30.4 pg (ref 26.0–34.0)
MCHC: 33.4 g/dL (ref 30.0–36.0)
MCV: 91 fL (ref 78.0–100.0)
Monocytes Absolute: 0.3 10*3/uL (ref 0.1–1.0)
Monocytes Relative: 7 % (ref 3–12)
Neutro Abs: 2.3 10*3/uL (ref 1.7–7.7)
Neutrophils Relative %: 49 % (ref 43–77)
Platelets: 194 10*3/uL (ref 150–400)
RBC: 4.24 MIL/uL (ref 3.87–5.11)
RDW: 12.9 % (ref 11.5–15.5)
WBC: 4.6 10*3/uL (ref 4.0–10.5)

## 2015-05-26 LAB — BASIC METABOLIC PANEL
Anion gap: 8 (ref 5–15)
BUN: 16 mg/dL (ref 6–20)
CO2: 26 mmol/L (ref 22–32)
Calcium: 9.9 mg/dL (ref 8.9–10.3)
Chloride: 109 mmol/L (ref 101–111)
Creatinine, Ser: 1.55 mg/dL — ABNORMAL HIGH (ref 0.44–1.00)
GFR calc Af Amer: 40 mL/min — ABNORMAL LOW (ref >60)
GFR calc non Af Amer: 35 mL/min — ABNORMAL LOW (ref >60)
Glucose, Bld: 102 mg/dL — ABNORMAL HIGH (ref 65–99)
Potassium: 4.4 mmol/L (ref 3.5–5.1)
Sodium: 143 mmol/L (ref 135–145)

## 2015-05-26 LAB — TROPONIN I: Troponin I: <0.03 ng/mL (ref <0.031)

## 2015-05-26 NOTE — ED Provider Notes (Signed)
CSN: [id number]     Arrival date & time 05/26/15  [id number] History   First MD Initiated Contact with Patient 05/26/15 0701     Chief Complaint  Patient presents with  . Shoulder Pain     (Consider location/radiation/quality/duration/timing/severity/associated sxs/prior Treatment) HPI   63yF with R shoulder, R upper back and R chest pain. Intermittent for weeks. Sometimes worse with movement but not always. No cough or sob. Doesn't seem to be exertional. No neck pain. No numbness or tingling. No fever or chills. No unusual leg pain or swelling.   Past Medical History  Diagnosis Date  . Hypertension     since 2007  . Gout   . Bipolar disorder     patient denies  . Insomnia   . Lithium toxicity   . Acute renal failure     lithium toxicity   Past Surgical History  Procedure Laterality Date  . Chemical imbalance    . Cholecystectomy    . Total abdominal hysterectomy    . Tonsillectomy    . Nasal sinus surgery  2014  . Cataract extraction, bilateral      lens implant bilaterally   Family History  Problem Relation Age of Onset  . CAD Mother   . CAD Father   . CVA Mother   . Parkinsonism Father    History  Substance Use Topics  . Smoking status: Never Smoker   . Smokeless tobacco: Not on file  . Alcohol Use: No   OB History    No data available     Review of Systems  All systems reviewed and negative, other than as noted in HPI.   Allergies  Codeine; Cortisone; Doxycycline; Morphine and related; Penicillins; and Promethazine  Home Medications   Prior to Admission medications   Medication Sig Start Date End Date Taking? Authorizing Provider  acetaminophen (TYLENOL) 500 MG tablet Take 500 mg by mouth every 6 (six) hours as needed for mild pain.   Yes Historical Provider, MD  allopurinol (ZYLOPRIM) 300 MG tablet Take 300 mg by mouth at bedtime.    Yes Historical Provider, MD  amLODipine (NORVASC) 5 MG tablet Take 5 mg by mouth at bedtime.    Yes Historical  Provider, MD  citalopram (CELEXA) 10 MG tablet Take 1 tablet (10 mg total) by mouth daily. Patient taking differently: Take 5 mg by mouth daily.  04/25/14  Yes Sinda Du, MD  divalproex (DEPAKOTE ER) 250 MG 24 hr tablet Take 250 mg by mouth 2 (two) times daily.   Yes Historical Provider, MD  doxepin (SINEQUAN) 50 MG capsule Take 50 mg by mouth at bedtime.   Yes Historical Provider, MD  albuterol (PROVENTIL HFA;VENTOLIN HFA) 108 (90 BASE) MCG/ACT inhaler Inhale 2 puffs into the lungs 2 (two) times daily.    Historical Provider, MD   BP 135/76 mmHg  Pulse 92  Temp(Src) 98.4 F (36.9 C) (Oral)  Resp 16  Ht 5\' 4"  (1.626 m)  Wt 160 lb (72.576 kg)  BMI 27.45 kg/m2  SpO2 98% Physical Exam  Constitutional: She appears well-developed and well-nourished. No distress.  HENT:  Head: Normocephalic and atraumatic.  Eyes: Conjunctivae are normal. Right eye exhibits no discharge. Left eye exhibits no discharge.  Neck: Neck supple.  Cardiovascular: Normal rate, regular rhythm and normal heart sounds.  Exam reveals no gallop and no friction rub.   No murmur heard. Pulmonary/Chest: Effort normal and breath sounds normal. No respiratory distress.  Abdominal: Soft. She exhibits no distension. There  is no tenderness.  Musculoskeletal: She exhibits no edema or tenderness.  Neurological: She is alert.  Lower extremities symmetric as compared to each other. No calf tenderness. Negative Homan's. No palpable cords.   Skin: Skin is warm and dry.  Psychiatric: She has a normal mood and affect. Her behavior is normal. Thought content normal.  Nursing note and vitals reviewed.      ED Course  Procedures (including critical care time) Labs Review Labs Reviewed  BASIC METABOLIC PANEL - Abnormal; Notable for the following:    Glucose, Bld 102 (*)    Creatinine, Ser 1.55 (*)    GFR calc non Af Amer 35 (*)    GFR calc Af Amer 40 (*)    All other components within normal limits  CBC WITH  DIFFERENTIAL/PLATELET  TROPONIN I    Imaging Review Dg Chest 2 View  05/26/2015   CLINICAL DATA:  Initial encounter for 2 week history of right back and shoulder pain with intermittent chest pain.  EXAM: CHEST  2 VIEW  COMPARISON:  03/12/2014.  12/22/2013.  FINDINGS: Lungs are hyperexpanded without edema or focal airspace consolidation. No pleural effusion. Chronic bibasilar atelectasis or scarring again noted. Cardiopericardial silhouette is at upper limits of normal for size. Imaged bony structures of the thorax are intact. Telemetry leads overlie the chest.  IMPRESSION: Stable. Hyperexpansion with basilar chronic atelectasis or linear scarring.   Electronically Signed   By: Misty Stanley M.D.   On: 05/26/2015 07:46     EKG Interpretation   Date/Time:  Wednesday May 26 2015 06:46:13 EDT Ventricular Rate:  72 PR Interval:  185 QRS Duration: 76 QT Interval:  390 QTC Calculation: 427 R Axis:   31 Text Interpretation:  Sinus rhythm Low voltage, precordial leads No  significant change since last tracing Confirmed by HORTON  MD, COURTNEY  [id number]) on 05/26/2015 6:51:36 AM      MDM   Final diagnoses:  Chest pain   63yF with r shoulder and R chest pain. Atypical for ACS. Doubt dissection, PE, SBI or other emergent process.      Virgel Manifold, MD 05/30/15 [phone]

## 2015-05-26 NOTE — Discharge Instructions (Signed)

## 2015-05-26 NOTE — ED Notes (Signed)
Pt c/o rt shoulder pain, back pain and intermittent chest pain x 2 weeks.

## 2015-06-28 ENCOUNTER — Telehealth: Payer: Self-pay | Admitting: *Deleted

## 2015-06-28 NOTE — Telephone Encounter (Signed)
Patient called she is very unclear as to what labs and why she would like for you to give her a call to explain these lab test and why she can not walk  Call back number [phone]

## 2015-06-28 NOTE — Telephone Encounter (Signed)
Left message on machine for patient to call back.

## 2015-06-29 NOTE — Telephone Encounter (Signed)
Spoke with patient re: normal labs and PN diagnosis. She will call back if needed.

## 2015-11-30 ENCOUNTER — Other Ambulatory Visit: Payer: Self-pay

## 2015-11-30 DIAGNOSIS — Z1231 Encounter for screening mammogram for malignant neoplasm of breast: Secondary | ICD-10-CM

## 2015-12-06 ENCOUNTER — Other Ambulatory Visit (HOSPITAL_COMMUNITY): Payer: Self-pay | Admitting: Pulmonary Disease

## 2015-12-06 ENCOUNTER — Ambulatory Visit (HOSPITAL_COMMUNITY)
Admission: RE | Admit: 2015-12-06 | Discharge: 2015-12-06 | Disposition: A | Discharge status: Home / Self Care | Payer: BC Managed Care – PPO | Source: Ambulatory Visit | Attending: Pulmonary Disease | Admitting: Pulmonary Disease

## 2015-12-06 DIAGNOSIS — M25511 Pain in right shoulder: Secondary | ICD-10-CM | POA: Insufficient documentation

## 2015-12-30 ENCOUNTER — Other Ambulatory Visit (INDEPENDENT_AMBULATORY_CARE_PROVIDER_SITE_OTHER): Payer: Self-pay | Admitting: Internal Medicine

## 2015-12-30 ENCOUNTER — Ambulatory Visit (INDEPENDENT_AMBULATORY_CARE_PROVIDER_SITE_OTHER): Payer: BC Managed Care – PPO | Admitting: Internal Medicine

## 2015-12-30 ENCOUNTER — Encounter (INDEPENDENT_AMBULATORY_CARE_PROVIDER_SITE_OTHER): Payer: Self-pay

## 2015-12-30 ENCOUNTER — Encounter (INDEPENDENT_AMBULATORY_CARE_PROVIDER_SITE_OTHER): Payer: Self-pay | Admitting: *Deleted

## 2015-12-30 ENCOUNTER — Encounter (INDEPENDENT_AMBULATORY_CARE_PROVIDER_SITE_OTHER): Payer: Self-pay | Admitting: Internal Medicine

## 2015-12-30 VITALS — BP 104/64 | HR 60 | Temp 97.4°F | Ht 64.5 in | Wt 157.8 lb

## 2015-12-30 DIAGNOSIS — K219 Gastro-esophageal reflux disease without esophagitis: Secondary | ICD-10-CM | POA: Diagnosis not present

## 2015-12-30 NOTE — Patient Instructions (Signed)
EGD. The risks and benefits such as perforation, bleeding, and infection were reviewed with the patient and is agreeable. 

## 2015-12-30 NOTE — Progress Notes (Signed)
Subjective:    Patient ID: Samantha Conley, female    DOB: 02/12/1951, 65 y.o.   MRN: [id number]  HPI Referred by Dr. Luan Pulling for GERD.  She has been seen a [facility] back in the summer. She points to her epigastric region. She had had symptoms off an on for a year. She says she has bad indigestion. She says her father had an Ulcerative esophagus. She was given a PPI by [facility] but never took the medication. She tells me she has chest pain at night.  If she sits up, the pain is better. She has stopped, corn, berries, nuts. Nuts constipate her. Berries give her indigestion.  Her last colonoscopy was in June of 2013. Biopsy showed tubular adenoma. Negative for high grade dysplasia or malignancy. ( I will get the actually colonoscopy report).  H. Pylori negative this summer per patient.  Appetite is good. No weight loss. BMs are normal. No melena or BRRB. Family hx of colon cancer in a grandmother in her 78s or 34s.  Hx of CKD from  Lithium for her Bipolar. She is followed by Dr. Luan Pulling for her kidney disease.  Hx significant for Bipolar.   10/09/2015 Creatinine 1.33, eGFR 43 L BUN 17  CMP     Component Value Date/Time   NA 143 05/26/2015 0749   K 4.4 05/26/2015 0749   CL 109 05/26/2015 0749   CO2 26 05/26/2015 0749   GLUCOSE 102* 05/26/2015 0749   BUN 16 05/26/2015 0749   CREATININE 1.55* 05/26/2015 0749   CALCIUM 9.9 05/26/2015 0749   PROT 6.5 11/29/2014 0804   ALBUMIN 4.0 11/29/2014 0804   AST 18 11/29/2014 0804   ALT 14 11/29/2014 0804   ALKPHOS 61 11/29/2014 0804   BILITOT 0.6 11/29/2014 0804   GFRNONAA 35* 05/26/2015 0749   GFRAA 40* 05/26/2015 0749      Review of Systems Past Medical History  Diagnosis Date  . Hypertension     since 2007  . Gout   . Bipolar disorder ([place])     patient denies  . Insomnia   . Lithium toxicity   . Acute renal failure (H[facility])     lithium toxicity    Past Surgical History  Procedure Laterality Date  . Chemical imbalance    .  Cholecystectomy    . Total abdominal hysterectomy    . Tonsillectomy    . Nasal sinus surgery  2014  . Cataract extraction, bilateral      lens implant bilaterally    Allergies  Allergen Reactions  . Codeine Other (See Comments)    Makes states it makes her "spacey"  . Cortisone   . Doxycycline     Severe diarrhea  . Morphine And Related Nausea And Vomiting  . Penicillins Other (See Comments)    unknown  . Promethazine Other (See Comments)    Patient states she is not allergic but it makes her  "spacey and not in control"    Current Outpatient Prescriptions on File Prior to Visit  Medication Sig Dispense Refill  . acetaminophen (TYLENOL) 500 MG tablet Take 500 mg by mouth every 6 (six) hours as needed for mild pain.    Marland Kitchen allopurinol (ZYLOPRIM) 300 MG tablet Take 300 mg by mouth at bedtime.     Marland Kitchen amLODipine (NORVASC) 5 MG tablet Take 5 mg by mouth at bedtime.     . citalopram (CELEXA) 10 MG tablet Take 1 tablet (10 mg total) by mouth daily. [place]  tablet 12  . divalproex (DEPAKOTE ER) 250 MG 24 hr tablet Take 250 mg by mouth 2 (two) times daily.    Marland Kitchen doxepin (SINEQUAN) 50 MG capsule Take 50 mg by mouth at bedtime.     No current facility-administered medications on file prior to visit.        Objective:   Physical Exam Blood pressure 104/64, pulse 60, temperature 97.4 F (36.3 C), height 5' 4.5" (1.638 m), weight 157 lb 12.8 oz (71.578 kg). Alert and oriented. Skin warm and dry. Oral mucosa is moist.   . Sclera anicteric, conjunctivae is pink. Thyroid not enlarged. No cervical lymphadenopathy. Lungs clear. Heart regular rate and rhythm.  Abdomen is soft. Bowel sounds are positive. No hepatomegaly. No abdominal masses felt. No tenderness.  No edema to lower extremities.        Assessment & Plan:  GERD. Am going to start her on Omeprazole 34m daily (30 minutes before breakfast).  Will schedule an EGD with Dr RLaural Golden  The risks and benefits such as perforation, bleeding, and  infection were reviewed with the patient and is agreeable. Start taking the PPI that was prescribed by [facility].

## 2016-01-04 ENCOUNTER — Encounter (INDEPENDENT_AMBULATORY_CARE_PROVIDER_SITE_OTHER): Payer: Self-pay

## 2016-01-14 ENCOUNTER — Ambulatory Visit
Admission: RE | Admit: 2016-01-14 | Discharge: 2016-01-14 | Disposition: A | Discharge status: Home / Self Care | Payer: BC Managed Care – PPO | Source: Ambulatory Visit

## 2016-01-14 DIAGNOSIS — Z1231 Encounter for screening mammogram for malignant neoplasm of breast: Secondary | ICD-10-CM

## 2016-01-19 ENCOUNTER — Telehealth (INDEPENDENT_AMBULATORY_CARE_PROVIDER_SITE_OTHER): Payer: Self-pay | Admitting: *Deleted

## 2016-01-19 NOTE — Telephone Encounter (Signed)
Note for chart   [employer] had called with patient on the line.  Patient was upset with [place] and stated we had collected at her office visit in January 45 co-pay and she had received a bill from [facility] for an additional $40.00.  Her card had on it designated specialty co-pay $45/$85 specialty co-pay.  Clarice had come to me before I had spoke with them.  She asked before giving me the call and knows she had told her and others that she was not sure of the difference nor the patient and collected the $45.00 and explained that if it was not correct she would receive a bill for the additional amount.  During the conversation with [employer] and patient she asked what the difference was on the co-pays--I told her since [employer] was on the line, that was a good question to ask them.  They explained we were not a designated Solicitor and that is why the higher co-pay . So, I do not understand them putting [place] on the phone when they could have answered the patients question.  I told her she really needed to look at her policy and ask [employer] about her insurance as they change so often it is hardly no way for [place] to keep up in the office setting what her benefits would be.  She then brought up that  back in 2013 when she came in and needed a colonoscopy she was told she did not have preventative benefits for her procedure being done.  Our office was lacking and she thought Dr. Laural Golden  needed to know this.  I told her I would let him know and was sorry she felt that way.              I called patient back after conversation ended with [facility] and spoke to her and spoke with Lelon Frohlich about her procedure that she was to have had in 2013.  She was seen for constipation and a colonoscopy was ordered to exam why.  This was not a preventative test--not screening test since she had symptoms.  She understood and said she had gone to [facility] and only had to pay $30.00 co pay and had done at their facility not at [facility].  I tried to  tell her that we have a pre-service center that checks benefits etc.   She was very concerned about how much her EGD would cost and I told her I would call Pre-service center and have them check and call her or she could call me next day or so.

## 2016-01-25 NOTE — Telephone Encounter (Signed)
Reviewed with [name].

## 2016-03-03 ENCOUNTER — Encounter (INDEPENDENT_AMBULATORY_CARE_PROVIDER_SITE_OTHER): Payer: Self-pay | Admitting: *Deleted

## 2016-04-17 ENCOUNTER — Ambulatory Visit (HOSPITAL_COMMUNITY)
Admission: RE | Admit: 2016-04-17 | Discharge: 2016-04-17 | Disposition: A | Discharge status: Home / Self Care | Payer: BC Managed Care – PPO | Source: Ambulatory Visit | Attending: Pulmonary Disease | Admitting: Pulmonary Disease

## 2016-04-17 ENCOUNTER — Ambulatory Visit (HOSPITAL_COMMUNITY)
Admission: RE | Admit: 2016-04-17 | Discharge: 2016-04-17 | Disposition: A | Discharge status: Home / Self Care | Payer: BC Managed Care – PPO | Source: Ambulatory Visit

## 2016-04-17 DIAGNOSIS — I10 Essential (primary) hypertension: Secondary | ICD-10-CM | POA: Diagnosis not present

## 2016-04-17 DIAGNOSIS — R079 Chest pain, unspecified: Secondary | ICD-10-CM

## 2016-04-17 DIAGNOSIS — N179 Acute kidney failure, unspecified: Secondary | ICD-10-CM | POA: Insufficient documentation

## 2016-04-17 DIAGNOSIS — I5189 Other ill-defined heart diseases: Secondary | ICD-10-CM | POA: Diagnosis not present

## 2016-04-17 DIAGNOSIS — I700 Atherosclerosis of aorta: Secondary | ICD-10-CM | POA: Insufficient documentation

## 2016-04-17 DIAGNOSIS — F319 Bipolar disorder, unspecified: Secondary | ICD-10-CM | POA: Insufficient documentation

## 2016-04-17 NOTE — Progress Notes (Signed)
*  PRELIMINARY RESULTS* Echocardiogram 2D Echocardiogram has been performed.  Leavy Cella 04/17/2016, 4:07 PM

## 2016-05-02 ENCOUNTER — Emergency Department (HOSPITAL_COMMUNITY)
Admission: EM | Admit: 2016-05-02 | Discharge: 2016-05-03 | Disposition: A | Discharge status: Other Acute Inpatient Hospital | Payer: BC Managed Care – PPO | Attending: Emergency Medicine | Admitting: Emergency Medicine

## 2016-05-02 ENCOUNTER — Encounter (HOSPITAL_COMMUNITY): Payer: Self-pay | Admitting: Emergency Medicine

## 2016-05-02 DIAGNOSIS — I10 Essential (primary) hypertension: Secondary | ICD-10-CM | POA: Diagnosis not present

## 2016-05-02 DIAGNOSIS — F99 Mental disorder, not otherwise specified: Secondary | ICD-10-CM | POA: Diagnosis not present

## 2016-05-02 DIAGNOSIS — R443 Hallucinations, unspecified: Secondary | ICD-10-CM | POA: Diagnosis present

## 2016-05-02 DIAGNOSIS — Z79899 Other long term (current) drug therapy: Secondary | ICD-10-CM | POA: Insufficient documentation

## 2016-05-02 DIAGNOSIS — F319 Bipolar disorder, unspecified: Secondary | ICD-10-CM | POA: Diagnosis not present

## 2016-05-02 LAB — URINALYSIS, ROUTINE W REFLEX MICROSCOPIC
Bilirubin Urine: NEGATIVE
Glucose, UA: NEGATIVE mg/dL
HGB URINE DIPSTICK: NEGATIVE
KETONES UR: NEGATIVE mg/dL
Nitrite: NEGATIVE
Protein, ur: NEGATIVE mg/dL
Specific Gravity, Urine: <1.005 — ABNORMAL LOW (ref 1.005–1.030)
pH: 5.5 (ref 5.0–8.0)

## 2016-05-02 LAB — COMPREHENSIVE METABOLIC PANEL
ALK PHOS: 80 U/L (ref 38–126)
ALT: 18 U/L (ref 14–54)
ANION GAP: 8 (ref 5–15)
AST: 29 U/L (ref 15–41)
Albumin: 4.7 g/dL (ref 3.5–5.0)
BILIRUBIN TOTAL: 1.3 mg/dL — AB (ref 0.3–1.2)
BUN: 18 mg/dL (ref 6–20)
CALCIUM: 10.4 mg/dL — AB (ref 8.9–10.3)
CO2: 25 mmol/L (ref 22–32)
Chloride: 109 mmol/L (ref 101–111)
Creatinine, Ser: 1.33 mg/dL — ABNORMAL HIGH (ref 0.44–1.00)
GFR calc Af Amer: 48 mL/min — ABNORMAL LOW (ref >60)
GFR, EST NON AFRICAN AMERICAN: 42 mL/min — AB (ref >60)
Glucose, Bld: 105 mg/dL — ABNORMAL HIGH (ref 65–99)
POTASSIUM: 3.5 mmol/L (ref 3.5–5.1)
Sodium: 142 mmol/L (ref 135–145)
TOTAL PROTEIN: 7.6 g/dL (ref 6.5–8.1)

## 2016-05-02 LAB — RAPID URINE DRUG SCREEN, HOSP PERFORMED
AMPHETAMINES: NOT DETECTED
BARBITURATES: NOT DETECTED
BENZODIAZEPINES: NOT DETECTED
Cocaine: NOT DETECTED
Opiates: NOT DETECTED
Tetrahydrocannabinol: NOT DETECTED

## 2016-05-02 LAB — CBC WITH DIFFERENTIAL/PLATELET
Basophils Absolute: 0 10*3/uL (ref 0.0–0.1)
Basophils Relative: 1 %
Eosinophils Absolute: 0 10*3/uL (ref 0.0–0.7)
Eosinophils Relative: 0 %
HEMATOCRIT: 39.8 % (ref 36.0–46.0)
Hemoglobin: 13.2 g/dL (ref 12.0–15.0)
LYMPHS ABS: 2.4 10*3/uL (ref 0.7–4.0)
LYMPHS PCT: 30 %
MCH: 30 pg (ref 26.0–34.0)
MCHC: 33.2 g/dL (ref 30.0–36.0)
MCV: 90.5 fL (ref 78.0–100.0)
MONO ABS: 0.5 10*3/uL (ref 0.1–1.0)
MONOS PCT: 6 %
NEUTROS ABS: 5 10*3/uL (ref 1.7–7.7)
Neutrophils Relative %: 63 %
Platelets: 264 10*3/uL (ref 150–400)
RBC: 4.4 MIL/uL (ref 3.87–5.11)
RDW: 13.3 % (ref 11.5–15.5)
WBC: 8 10*3/uL (ref 4.0–10.5)

## 2016-05-02 LAB — ACETAMINOPHEN LEVEL

## 2016-05-02 LAB — URINE MICROSCOPIC-ADD ON: RBC / HPF: NONE SEEN RBC/hpf (ref 0–5)

## 2016-05-02 LAB — ETHANOL: Alcohol, Ethyl (B): <5 mg/dL (ref <5)

## 2016-05-02 LAB — TSH: TSH: 0.846 u[IU]/mL (ref 0.350–4.500)

## 2016-05-02 LAB — SALICYLATE LEVEL

## 2016-05-02 MED ORDER — ALLOPURINOL 300 MG PO TABS
300.0000 mg | ORAL_TABLET | Freq: Every day | ORAL | Status: DC
  Administered 2016-05-02: 300 mg via ORAL
  Filled 2016-05-02 (×3): qty 1

## 2016-05-02 MED ORDER — ACETAMINOPHEN 325 MG PO TABS
650.0000 mg | ORAL_TABLET | ORAL | Status: DC | PRN
  Administered 2016-05-02: 650 mg via ORAL
  Filled 2016-05-02: qty 2

## 2016-05-02 MED ORDER — POLYETHYLENE GLYCOL 3350 17 G PO PACK: 17.0000 g | PACK | Freq: Every day | ORAL | Status: DC | PRN

## 2016-05-02 MED ORDER — DOXEPIN HCL 50 MG PO CAPS
50.0000 mg | ORAL_CAPSULE | Freq: Every day | ORAL | Status: DC
  Administered 2016-05-02: 50 mg via ORAL
  Filled 2016-05-02 (×3): qty 1

## 2016-05-02 MED ORDER — LORAZEPAM 1 MG PO TABS
1.0000 mg | ORAL_TABLET | Freq: Three times a day (TID) | ORAL | Status: DC | PRN
  Administered 2016-05-03: 1 mg via ORAL
  Filled 2016-05-02: qty 1

## 2016-05-02 MED ORDER — CITALOPRAM HYDROBROMIDE 10 MG PO TABS
10.0000 mg | ORAL_TABLET | Freq: Every day | ORAL | Status: DC
  Administered 2016-05-03: 10 mg via ORAL
  Filled 2016-05-02 (×4): qty 1

## 2016-05-02 MED ORDER — DIVALPROEX SODIUM ER 250 MG PO TB24
250.0000 mg | ORAL_TABLET | Freq: Two times a day (BID) | ORAL | Status: DC
Start: 2016-05-02 — End: 2016-05-03
  Administered 2016-05-02 – 2016-05-03 (×2): 250 mg via ORAL
  Filled 2016-05-02 (×6): qty 1

## 2016-05-02 MED ORDER — AMLODIPINE BESYLATE 5 MG PO TABS
5.0000 mg | ORAL_TABLET | Freq: Every day | ORAL | Status: DC
  Administered 2016-05-02: 5 mg via ORAL
  Filled 2016-05-02: qty 1

## 2016-05-02 MED ORDER — ONDANSETRON HCL 4 MG PO TABS: 4.0000 mg | ORAL_TABLET | Freq: Three times a day (TID) | ORAL | Status: DC | PRN

## 2016-05-02 NOTE — ED Notes (Signed)
Pt reports auditory and visual hallucinations. Pt states that she "feels like she is the devils wife." Pt states, "I feel like I am damned to hell because I have lied so much. I want to king of the world." "I feel like I am a dirty person, I feel unforgiven."  Pt accompanied by friend who reports pt is not taking her medications. States that pt has expressed a lot of anxiety and not sleeping well. Pt also disoriented, unable to tell time/day. Pt unable to answer if she is HI/SI.

## 2016-05-02 NOTE — ED Notes (Signed)
MD at bedside. 

## 2016-05-02 NOTE — ED Provider Notes (Signed)
CSN: [id number]     Arrival date & time 05/02/16  1132 History   First MD Initiated Contact with Patient 05/02/16 1158     Chief Complaint  Patient presents with  . Hallucinations     (Consider location/radiation/quality/duration/timing/severity/associated sxs/prior Treatment) HPI 65 year old female who presents with auditory and visual hallucinations. She has a history of hypertension, bipolar disorder, and gout. States that she has had a lot of stress related to her work recently, which seems to have put her off of her routine. She states that she is a very routine person, and feels everything is out of sorts when she is off of her routine. For this past week she has been feeling increased racing thoughts. She states that she has thoughts that she was going to go to "hell" and that she was the "doubles wife." Her friend states that this is not usual for her and is also seen this change over the past week. They state that she has had increasing guilt over the death of family members that have occurred several years ago. She is also told the nurse that "I feel like I'm damned to  hell because I've lied so much. I feel like I am a dirty person, and I feel unforgiven." She is unsure if she is seeing things that aren't there, but states that she has been seeing things like a white count that may or may not have been in her home. She denies any suicidal or homicidal thoughts. States that she thinks she may not have been taking some of her medications over this past week. Denies any overdose of medications.  Denies other medical issues including fever, cough, sob, chest pain,a bd pain, urinary complaints, n/v/d. Past Medical History  Diagnosis Date  . Hypertension     since 2007  . Gout   . Bipolar disorder ([place])     patient denies  . Insomnia   . Lithium toxicity   . Acute renal failure (HCC)     lithium toxicity   Past Surgical History  Procedure Laterality Date  . Chemical imbalance    .  Cholecystectomy    . Total abdominal hysterectomy    . Tonsillectomy    . Nasal sinus surgery  2014  . Cataract extraction, bilateral      lens implant bilaterally   Family History  Problem Relation Age of Onset  . CAD Mother   . CVA Mother   . CAD Father   . Parkinsonism Father    Social History  Substance Use Topics  . Smoking status: Never Smoker   . Smokeless tobacco: None  . Alcohol Use: No   OB History    No data available     Review of Systems 10/14 systems reviewed and are negative other than those stated in the HPI     Allergies  Benadryl; Codeine; Cortisone; Doxycycline; Morphine and related; Penicillins; and Promethazine  Home Medications   Prior to Admission medications   Medication Sig Start Date End Date Taking? Authorizing Provider  acetaminophen (TYLENOL) 500 MG tablet Take 500 mg by mouth every 6 (six) hours as needed for mild pain.    Historical Provider, MD  allopurinol (ZYLOPRIM) 300 MG tablet Take 300 mg by mouth at bedtime.     Historical Provider, MD  amLODipine (NORVASC) 5 MG tablet Take 5 mg by mouth at bedtime.     Historical Provider, MD  citalopram (CELEXA) 10 MG tablet Take 1 tablet (10 mg total) by mouth  daily. 04/25/14   Sinda Du, MD  divalproex (DEPAKOTE ER) 250 MG 24 hr tablet Take 250 mg by mouth 2 (two) times daily.    Historical Provider, MD  doxepin (SINEQUAN) 50 MG capsule Take 50 mg by mouth at bedtime.    Historical Provider, MD  polyethylene glycol (MIRALAX / GLYCOLAX) packet Take 17 g by mouth daily as needed for mild constipation.    Historical Provider, MD   BP 148/90 mmHg  Pulse 98  Temp(Src) 98 F (36.7 C) (Oral)  Resp 40  Ht 5\' 6"  (1.676 m)  Wt 154 lb 8 oz (70.081 kg)  BMI 24.95 kg/m2  SpO2 100% Physical Exam Physical Exam  Nursing note and vitals reviewed. Constitutional: Well developed, well nourished, non-toxic, anxious appearing, but in no acute distress Head: Normocephalic and atraumatic.   Mouth/Throat: Oropharynx is clear and moist.  Neck: Normal range of motion. Neck supple.  Cardiovascular: Normal rate and regular rhythm.   Pulmonary/Chest: Effort normal and breath sounds normal.  Abdominal: Soft. There is no tenderness. There is no rebound and no guarding.  Musculoskeletal: Normal range of motion.  Neurological: Alert, no facial droop, fluent speech, moves all extremities symmetrically Skin: Skin is warm and dry.  Psychiatric: Cooperative  ED Course  Procedures (including critical care time) Labs Review Labs Reviewed  COMPREHENSIVE METABOLIC PANEL - Abnormal; Notable for the following:    Glucose, Bld 105 (*)    Creatinine, Ser 1.33 (*)    Calcium 10.4 (*)    Total Bilirubin 1.3 (*)    GFR calc non Af Amer 42 (*)    GFR calc Af Amer 48 (*)    All other components within normal limits  URINALYSIS, ROUTINE W REFLEX MICROSCOPIC (NOT AT [facility]) - Abnormal; Notable for the following:    Color, Urine STRAW (*)    Specific Gravity, Urine <1.005 (*)    Leukocytes, UA TRACE (*)    All other components within normal limits  ACETAMINOPHEN LEVEL - Abnormal; Notable for the following:    Acetaminophen (Tylenol), Serum <10 (*)    All other components within normal limits  URINE MICROSCOPIC-ADD ON - Abnormal; Notable for the following:    Squamous Epithelial / LPF 6-30 (*)    Bacteria, UA FEW (*)    All other components within normal limits  TSH  CBC WITH DIFFERENTIAL/PLATELET  URINE RAPID DRUG SCREEN, HOSP PERFORMED  SALICYLATE LEVEL  ETHANOL    Imaging Review No results found. I have personally reviewed and evaluated these images and lab results as part of my medical decision-making.   EKG Interpretation None      MDM   Final diagnoses:  Psychiatric disorder    Presenting with increased abnormal thoughts, in setting of ? Missed doses of medications over past week. UA without infection. No electrolyte or metabolic derangements. Medically cleared for TTS  consult.  TTS recommending inpatient admission. Will place in psych hold.   Patient initially not wanting to stay in ED. Patient was not felt to be unsafe and after repeat discussion with TTS if patient would like to be discharge, does not need IVC and can be discharged. At this time, patient is amenable to staying and placement.   Forde Dandy, MD 05/02/16 2220

## 2016-05-02 NOTE — ED Notes (Signed)
Pt states that she normally takes her amlodipine and depakote and then takes her allopurinol and doxepin 2 hours later.  Pt requesting to have medications divided as such tonight

## 2016-05-02 NOTE — Progress Notes (Signed)
This Probation officer completed a chart review for placement.      Chesley Noon, MSW, Darlyn Read [facility] Triage Specialist [phone]

## 2016-05-02 NOTE — Progress Notes (Signed)
Attempted to secure placement at the following facilities:  [facility] [facility]   Chesley Noon, MSW, Darlyn Read [facility] Triage Specialist [phone]

## 2016-05-02 NOTE — BH Assessment (Addendum)
Error in charting.

## 2016-05-02 NOTE — ED Notes (Addendum)
Friends left phone numbers in case of difficulty during night.  Lawerance Bach [place] [phone]

## 2016-05-02 NOTE — BH Assessment (Addendum)
Tele Assessment Note   Samantha Conley is a 65 y.o. female who presents to [facility] voluntarily due to racing thoughts and hallucinations. Pt reported having "thoughts in my head, like demons" and repeatedly stated that "I feel like I'm the devil", "I feel like I'm the devil's wife", and "I feel like I'm going to hell". Pt was very tangential in her speech and it proved difficult to complete a thorough assessment with pt. Pt continued to run thru a list of all of her "sins", dating back over her lifetime (as far back as childhood). Pt expressed an extreme amount of guilt for her sins and remained pre-occupied with confessing all of them to Probation officer during assessment.  Pt was unclear about her psychiatric hx, including her current med compliance. Pt was also unclear as to when she started experiencing these feelings of intense guilt. Pt endorsed hallucinations and shared that she saw little ants and larger ants, but veered off of this train of thought to start discussing her sins again. Pt indicated that she got very little sleep, due to these continuous, running thoughts in her head. Pt denied SI and HI.   Diagnosis: Bipolar I Disorder, current or most recent episode manic, with psychotic features  Past Medical History:  Past Medical History  Diagnosis Date  . Hypertension     since 2007  . Gout   . Bipolar disorder ([place])     patient denies  . Insomnia   . Lithium toxicity   . Acute renal failure (HCC)     lithium toxicity    Past Surgical History  Procedure Laterality Date  . Chemical imbalance    . Cholecystectomy    . Total abdominal hysterectomy    . Tonsillectomy    . Nasal sinus surgery  2014  . Cataract extraction, bilateral      lens implant bilaterally    Family History:  Family History  Problem Relation Age of Onset  . CAD Mother   . CVA Mother   . CAD Father   . Parkinsonism Father     Social History:  reports that she has never smoked. She does not have any smokeless  tobacco history on file. She reports that she does not drink alcohol or use illicit drugs.  Additional Social History:  Alcohol / Drug Use Pain Medications: see PTA meds Prescriptions: see PTA meds Over the Counter: see PTA meds History of alcohol / drug use?: No history of alcohol / drug abuse  CIWA: CIWA-Ar BP: 148/90 mmHg Pulse Rate: 98 COWS:    PATIENT STRENGTHS: (choose at least two) Average or above average intelligence Capable of independent living Communication skills Motivation for treatment/growth  Allergies:  Allergies  Allergen Reactions  . Benadryl [Diphenhydramine Hcl] Other (See Comments)    Not allergic, but makes her hyper  . Codeine Other (See Comments)    Makes states it makes her "spacey"  . Cortisone   . Doxycycline     Severe diarrhea  . Morphine And Related Nausea And Vomiting  . Penicillins Other (See Comments)    Childhood allergy. Has patient had a PCN reaction causing immediate rash, facial/tongue/throat swelling, SOB or lightheadedness with hypotension: unknown Has patient had a PCN reaction causing severe rash involving mucus membranes or skin necrosis: unknown Has patient had a PCN reaction that required hospitalization: unknown Has patient had a PCN reaction occurring within the last 10 years:/No:[id number]} unknown If all of the above answers are "NO", then may proceed with Cephalosporin  use.   . Promethazine Other (See Comments)    Patient states she is not allergic but it makes her  "spacey and not in control"    Home Medications:  (Not in a hospital admission)  OB/GYN Status:  No LMP recorded. Patient has had a hysterectomy.  General Assessment Data Location of Assessment: [facility] TTS Assessment: In system Is this a Tele or Face-to-Face Assessment?: Tele Assessment Is this an Initial Assessment or a Re-assessment for this encounter?: Initial Assessment Marital status: Single Is patient pregnant?: No Pregnancy Status: No Living  Arrangements: Alone Can pt return to current living arrangement?: Yes Admission Status: Voluntary Is patient capable of signing voluntary admission?: Yes Referral Source: Self/Family/Friend Insurance type: [place] Screening Exam ([place]) Medical Exam completed: Yes  Crisis Care Plan Living Arrangements: Alone Name of Psychiatrist: none specified Name of Therapist: none specified  Education Status Is patient currently in school?: No  Risk to self with the past 6 months Suicidal Ideation: No Has patient been a risk to self within the past 6 months prior to admission? : No Suicidal Intent: No Has patient had any suicidal intent within the past 6 months prior to admission? : No Is patient at risk for suicide?: No Suicidal Plan?: No Has patient had any suicidal plan within the past 6 months prior to admission? : No Access to Means: No What has been your use of drugs/alcohol within the last 12 months?: denies Previous Attempts/Gestures: No How many times?: 0 Other Self Harm Risks: 0 Triggers for Past Attempts: Other (Comment) (no reported past attempts) Intentional Self Injurious Behavior: None Family Suicide History: Yes Recent stressful life event(s): Other (Comment) (possible med non-compliant-overwhelming feeling of guilt) Persecutory voices/beliefs?: Yes Depression: Yes Depression Symptoms: Insomnia, Guilt, Feeling worthless/self pity Substance abuse history and/or treatment for substance abuse?: No Suicide prevention information given to non-admitted patients: Not applicable  Risk to Others within the past 6 months Homicidal Ideation: No Does patient have any lifetime risk of violence toward others beyond the six months prior to admission? : No Thoughts of Harm to Others: No Current Homicidal Intent: No Current Homicidal Plan: No Access to Homicidal Means: No History of harm to others?: No Assessment of Violence: None Noted Violent Behavior  Description: none noted Does patient have access to weapons?: No Criminal Charges Pending?: No Does patient have a court date: No Is patient on probation?: No  Psychosis Hallucinations: Auditory, Visual Delusions: Persecutory  Mental Status Report Appearance/Hygiene: Unremarkable Eye Contact: Good Motor Activity: Unremarkable Speech: Logical/coherent, Pressured Level of Consciousness: Alert Mood: Anxious, Preoccupied, Ashamed/humiliated, Sad Affect: Appropriate to circumstance Anxiety Level: Moderate Thought Processes: Tangential Judgement: Impaired Orientation: Person, Place, Time, Situation Obsessive Compulsive Thoughts/Behaviors: Unable to Assess  Cognitive Functioning Concentration: Decreased Memory: Unable to Assess IQ: Average Insight: see judgement above Impulse Control: Unable to Assess Appetite: Fair Sleep: Decreased Total Hours of Sleep: 2 Vegetative Symptoms: None  ADLScreening [facility] Assessment Services) Patient's cognitive ability adequate to safely complete daily activities?: Yes Patient able to express need for assistance with ADLs?: Yes Independently performs ADLs?: Yes (appropriate for developmental age)  Prior Inpatient Therapy Prior Inpatient Therapy: No  Prior Outpatient Therapy Prior Outpatient Therapy: No Does patient have an ACCT team?: No Does patient have Intensive In-House Services?  : No Does patient have [facility] services? : No Does patient have P4CC services?: No  ADL Screening (condition at time of admission) Patient's cognitive ability adequate to safely complete daily activities?: Yes Is the patient  deaf or have difficulty hearing?: No Does the patient have difficulty seeing, even when wearing glasses/contacts?: No Does the patient have difficulty concentrating, remembering, or making decisions?: No Patient able to express need for assistance with ADLs?: Yes Does the patient have difficulty dressing or bathing?: No Independently  performs ADLs?: Yes (appropriate for developmental age) Does the patient have difficulty walking or climbing stairs?: No Weakness of Legs: None Weakness of Arms/Hands: None  Home Assistive Devices/Equipment Home Assistive Devices/Equipment: None  Therapy Consults (therapy consults require a physician order) PT Evaluation Needed: No OT Evalulation Needed: No SLP Evaluation Needed: No Abuse/Neglect Assessment (Assessment to be complete while patient is alone) Physical Abuse: Denies Verbal Abuse: Denies Sexual Abuse: Denies Exploitation of patient/patient's resources: Denies Self-Neglect: Denies Values / Beliefs Cultural Requests During Hospitalization: None Spiritual Requests During Hospitalization: None Consults Spiritual Care Consult Needed: No Social Work Consult Needed: No Regulatory affairs officer (For Healthcare) Does patient have an advance directive?: No Would patient like information on creating an advanced directive?: No - patient declined information    Additional Information 1:1 In Past 12 Months?: No CIRT Risk: No Elopement Risk: No Does patient have medical clearance?: Yes     Disposition:  Disposition Initial Assessment Completed for this Encounter: Yes Disposition of Patient: Inpatient treatment program (consulted with Elmarie Shiley, NP) Type of inpatient treatment program: Adult (TTS to seek placement)  Rexene Edison 05/02/2016 4:01 PM

## 2016-05-03 ENCOUNTER — Encounter: Payer: Self-pay | Admitting: Cardiology

## 2016-05-03 ENCOUNTER — Encounter: Payer: BC Managed Care – PPO | Admitting: Cardiology

## 2016-05-03 NOTE — ED Notes (Signed)
Pt having anxiety, respirations increased, 02 100% on RA.

## 2016-05-03 NOTE — ED Notes (Signed)
Pt awake sitting on edge of bed, sitter at bedside

## 2016-05-03 NOTE — ED Notes (Signed)
Meal given

## 2016-05-03 NOTE — BH Assessment (Signed)
PT has been added to [facility] waitlist per [place].

## 2016-05-03 NOTE — Progress Notes (Signed)
Present with patient per her request. Provided listening and supportive presence while she shared past medical/mental health issues up to today. She shared loss of both parents and separateness she continues to feel with her brothers. She shared how she knew at some level her religious thoughts about hell and the devil may not be true she was having a hard time realizing this. We talked about points of her faith which are helpful to her. We talked about guilt and issues of loneliness--trying to normalize what she was feeling and guide her to ideas of what is hopeful and helpful for her.

## 2016-05-03 NOTE — ED Notes (Signed)
Pt left with [facility] and sitter to ride along with pt due to pt anxiety.

## 2016-05-03 NOTE — ED Notes (Signed)
Pt unable to sleep, pacing floors, requesting something to help her to calm down

## 2016-05-03 NOTE — Progress Notes (Signed)
Pt accepted to [facility], room 152 bed 1. Accepting MD is Dr. Launa Grill, and report number is [phone]. Pt must arrive by 11pm tonight (if pt cannot be transported by 11pm- bed will be held and pt can come at [place], intake RN, states pt can be admitted voluntarily dependent on pt being willing to consent.  Sharren Bridge, MSW, LCSW Clinical Social Work, Disposition  05/03/2016 [phone]

## 2016-05-03 NOTE — ED Notes (Signed)
Lawerance Bach, church member left phone number with nurse [phone].  Pt at this time c/o having a nightmare. Pt mentioned that she has had recurrent dream about being the devil's wife.  Pt ambulating to bathroom at this time.

## 2016-05-03 NOTE — ED Notes (Signed)
Belongings given to transport service, keith.

## 2016-05-03 NOTE — ED Notes (Signed)
Attempted to call family (tabitha) to give disposition of pt, no answer.  Pt given phone to try to call family.    Notified Network engineer to call [facility] and request female driver per pt request.

## 2016-05-03 NOTE — ED Notes (Signed)
Requested medication from pharmacy.

## 2016-05-03 NOTE — Progress Notes (Addendum)
Followed up on inpatient referrals: (Also considered for admission to [facility] upon appropriate bed availability)  On waiting list at [facility] (per [place]) and [facility] (per Qu[facility]).  [facility]- called stating they are reviewing referral and have questions re: pt's behavior over night and details re: medications. Provided information from chart- [facility] requested to speak with RN. Directed her to [facility] charge. Asked to follow up with writer re: outcome. [place] states she will locate referral sent last night- will call back if not found to instruct to re-fax [facility]- per [facility] one possible female bed today- sent referral [facility]- Christy advised re-fax referral (sent yesterday) [facility]- advised fax for consideration [facility] Hoes advised they are at capacity today therefore referral sent yesterday was not reviewed.  Declined: [facility]- per [place] due to dx of kidney disease.   Sharren Bridge, MSW, LCSW Clinical Social Work, Disposition  05/03/2016 [phone]

## 2016-05-03 NOTE — Progress Notes (Signed)
Patient canceled.  This encounter was created in error - please disregard. 

## 2016-05-03 NOTE — ED Notes (Signed)
Alfonso Patten here to see Pt after request to her nurse.

## 2016-05-03 NOTE — BH Assessment (Signed)
Writer reviewed the patient with Attending Physician (Dr. Jerilee Hoh) and nursing staff. Due to the acuity of the unit and patient's acuity, [facility] is unable to manage her at this time.

## 2016-05-10 ENCOUNTER — Ambulatory Visit (HOSPITAL_COMMUNITY)
Admission: RE | Admit: 2016-05-10 | Payer: BC Managed Care – PPO | Source: Ambulatory Visit | Admitting: Internal Medicine

## 2016-05-10 ENCOUNTER — Encounter (HOSPITAL_COMMUNITY): Admission: RE | Payer: Self-pay | Source: Ambulatory Visit

## 2016-05-10 SURGERY — EGD (ESOPHAGOGASTRODUODENOSCOPY)
Anesthesia: Moderate Sedation

## 2016-05-24 ENCOUNTER — Encounter: Payer: Self-pay | Admitting: *Deleted

## 2016-05-24 ENCOUNTER — Ambulatory Visit (INDEPENDENT_AMBULATORY_CARE_PROVIDER_SITE_OTHER): Payer: BC Managed Care – PPO | Admitting: Cardiology

## 2016-05-24 ENCOUNTER — Telehealth: Payer: Self-pay | Admitting: Cardiology

## 2016-05-24 ENCOUNTER — Encounter: Payer: Self-pay | Admitting: Cardiology

## 2016-05-24 VITALS — BP 118/78 | HR 78 | Ht 64.0 in | Wt 153.0 lb

## 2016-05-24 DIAGNOSIS — R9431 Abnormal electrocardiogram [ECG] [EKG]: Secondary | ICD-10-CM

## 2016-05-24 DIAGNOSIS — R072 Precordial pain: Secondary | ICD-10-CM | POA: Diagnosis not present

## 2016-05-24 DIAGNOSIS — I10 Essential (primary) hypertension: Secondary | ICD-10-CM | POA: Diagnosis not present

## 2016-05-24 DIAGNOSIS — R0602 Shortness of breath: Secondary | ICD-10-CM | POA: Diagnosis not present

## 2016-05-24 NOTE — Telephone Encounter (Signed)
Can you find an early appointment for this patient for a Stress Echo.  Earliest I could find was July 28th

## 2016-05-24 NOTE — Progress Notes (Signed)
Cardiology Office Note  Date: 05/24/2016   ID: Samantha Conley, DOB 12/11/1950, MRN [id number]  PCP: Alonza Bogus, MD  Consulting Cardiologist: Rozann Lesches, MD   Chief Complaint  Patient presents with  . Chest Pain    History of Present Illness: Samantha Conley is a 65 y.o. female referred for cardiology consultation by Dr. Luan Pulling. I do not have complete records at this time. Based on chart review she was seen in the ER back in May with visual and auditory hallucinations and assessed by behavioral health. She presents to the office today to discuss chest pain symptoms, is alert and oriented and conversing appropriately. I do not have the records from Dr. Luan Pulling visit. She states that around [place] she experienced a sharp chest discomfort while she was walking to visit a friend's house. She has had intermittent episodes of tightness in her chest since then, not necessarily with exertion. She lives alone, states that she mows her own lawn and does other chores without typical exertional chest pain. She does have a strong family history of heart disease and also a personal history of hypertension.  I reviewed her medications which are outlined below. She has been on Norvasc long-term which has controlled her blood pressure fairly well by report.  I reviewed her most recent ECG and lab work. She also had an echocardiogram showing normal LVEF with mild diastolic dysfunction and no major valvular abnormalities. She has not undergone any ischemic testing.  Past Medical History  Diagnosis Date  . Essential hypertension   . Gout   . Bipolar disorder ([place])   . Insomnia   . Lithium toxicity   . Chronic kidney disease (CKD), stage III (moderate)     Past Surgical History  Procedure Laterality Date  . Cholecystectomy    . Total abdominal hysterectomy    . Tonsillectomy    . Nasal sinus surgery  2014  . Cataract extraction, bilateral      Lens implant bilaterally    Current  Outpatient Prescriptions  Medication Sig Dispense Refill  . acetaminophen (TYLENOL) 500 MG tablet Take 500 mg by mouth every 6 (six) hours as needed for mild pain.    Marland Kitchen allopurinol (ZYLOPRIM) 300 MG tablet Take 300 mg by mouth at bedtime.     Marland Kitchen amLODipine (NORVASC) 5 MG tablet Take 5 mg by mouth at bedtime.     . citalopram (CELEXA) 10 MG tablet Take 1 tablet (10 mg total) by mouth daily. (Patient taking differently: Take 5-10 mg by mouth every morning. ) 30 tablet 12  . divalproex (DEPAKOTE ER) 250 MG 24 hr tablet Take 250 mg by mouth every evening.     Marland Kitchen doxepin (SINEQUAN) 50 MG capsule Take 50 mg by mouth at bedtime.    . polyethylene glycol (MIRALAX / GLYCOLAX) packet Take 17 g by mouth daily as needed for mild constipation.     No current facility-administered medications for this visit.   Allergies:  Benadryl; Codeine; Cortisone; Doxycycline; Morphine and related; Penicillins; and Promethazine   Social History: The patient  reports that she has never smoked. She has never used smokeless tobacco. She reports that she does not drink alcohol or use illicit drugs.   Family History: The patient's family history includes CAD in her father and mother; CVA in her mother; Parkinsonism in her father.   ROS:  Please see the history of present illness. Otherwise, complete review of systems is positive for mild dyspnea on exertion.  All  other systems are reviewed and negative.   Physical Exam: VS:  BP 118/78 mmHg  Pulse 78  Ht 5\' 4"  (1.626 m)  Wt 153 lb (69.4 kg)  BMI 26.25 kg/m2  SpO2 98%, BMI Body mass index is 26.25 kg/(m^2).  Wt Readings from Last 3 Encounters:  05/24/16 153 lb (69.4 kg)  05/02/16 154 lb 8 oz (70.081 kg)  12/30/15 157 lb 12.8 oz (71.578 kg)    General: Patient appears comfortable at rest. HEENT: Conjunctiva and lids normal, oropharynx clear. Neck: Supple, no elevated JVP or carotid bruits, no thyromegaly. Lungs: Clear to auscultation, nonlabored breathing at  rest. Cardiac: Regular rate and rhythm, no S3 or significant systolic murmur, no pericardial rub. Abdomen: Soft, nontender, bowel sounds present, no guarding or rebound. Extremities: No pitting edema, distal pulses 2+. Skin: Warm and dry. Musculoskeletal: No kyphosis. Neuropsychiatric: Alert and oriented x3, affect grossly appropriate.  ECG: I personally reviewed the tracing from 01/09/2016 which showed normal sinus rhythm with anteroseptal T-wave inversions, nonspecific ST changes, and PVC.  Recent Labwork: 05/02/2016: ALT 18; AST 29; BUN 18; Creatinine, Ser 1.33*; Hemoglobin 13.2; Platelets 264; Potassium 3.5; Sodium 142; TSH 0.846   Other Studies Reviewed Today:  Echocardiogram 04/17/2016: Study Conclusions  - Left ventricle: The cavity size was normal. Wall thickness was  increased in a pattern of mild LVH. Systolic function was normal.  The estimated ejection fraction was in the range of 55% to 60%.  Wall motion was normal; there were no regional wall motion  abnormalities. Doppler parameters are consistent with abnormal  left ventricular relaxation (grade 1 diastolic dysfunction). - Aortic valve: Mildly calcified annulus. Trileaflet; normal  thickness leaflets. Valve area (VTI): 2.35 cm^2. Valve area  (Vmax): 2.3 cm^2. - Technically adequate study.  Chest x-ray 05/26/2015: FINDINGS: Lungs are hyperexpanded without edema or focal airspace consolidation. No pleural effusion. Chronic bibasilar atelectasis or scarring again noted. Cardiopericardial silhouette is at upper limits of normal for size. Imaged bony structures of the thorax are intact. Telemetry leads overlie the chest.  IMPRESSION: Stable. Hyperexpansion with basilar chronic atelectasis or linear scarring.  Assessment and Plan:  1. Chest pain with typical and atypical features, mild dyspnea on exertion at times. Patient has a strong family history of CAD, also personal history of hypertension.  Echocardiogram done recently shows normal LVEF with mild diastolic dysfunction and ECG is abnormal but overall nonspecific. She has not undergone any objective ischemic testing and we will arrange an exercise echocardiogram for further evaluation.  2. Essential hypertension, blood pressure is well controlled today on Norvasc.  3. Chart indicates history of bipolar disorder and prior lithium toxicity, I do not have complete details about her psychiatric history or recent evaluations.  Current medicines were reviewed with the patient today.   Orders Placed This Encounter  Procedures  . ECHO STRESS TEST    Disposition: Call with results.  Signed, Satira Sark, MD, [facility] 05/24/2016 9:47 AM    [facility] [place], [place], [place] [address] Phone: [phone]; Fax: [phone]

## 2016-05-24 NOTE — Patient Instructions (Signed)
Medication Instructions:   Continue all current medications.  Labwork:  NONE  Testing/Procedures:  Your physician has requested that you have a stress echocardiogram. For further information please visit [email or web address]. Please follow instruction sheet as given.  Office will contact with results via phone or letter.    Follow-Up:  Pending test results.    If you need a refill on your cardiac medications before your next appointment, please call your pharmacy.

## 2016-06-09 ENCOUNTER — Ambulatory Visit (HOSPITAL_COMMUNITY)
Admission: RE | Admit: 2016-06-09 | Discharge: 2016-06-09 | Disposition: A | Discharge status: Home / Self Care | Payer: BC Managed Care – PPO | Source: Ambulatory Visit | Attending: Cardiology | Admitting: Cardiology

## 2016-06-09 DIAGNOSIS — I493 Ventricular premature depolarization: Secondary | ICD-10-CM | POA: Insufficient documentation

## 2016-06-09 DIAGNOSIS — R072 Precordial pain: Secondary | ICD-10-CM | POA: Diagnosis not present

## 2016-06-09 DIAGNOSIS — R0602 Shortness of breath: Secondary | ICD-10-CM

## 2016-06-09 LAB — ECHOCARDIOGRAM STRESS TEST
CHL CUP RESTING HR STRESS: 71 {beats}/min
CSEPED: 4 min
CSEPEDS: 29 s
CSEPPHR: 144 {beats}/min
Estimated workload: 7 METS
MPHR: 157 {beats}/min
Percent HR: 91 %
RPE: 15

## 2016-06-09 NOTE — Progress Notes (Signed)
*  PRELIMINARY RESULTS* Echocardiogram Echocardiogram Stress Test has been performed.  Leavy Cella 06/09/2016, 10:01 AM

## 2016-06-12 ENCOUNTER — Telehealth: Payer: Self-pay | Admitting: Cardiology

## 2016-06-12 NOTE — Telephone Encounter (Signed)
Would like to know results of test she had on Friday

## 2016-06-12 NOTE — Telephone Encounter (Signed)
Pt aware, routed to pcp 

## 2016-06-13 ENCOUNTER — Ambulatory Visit: Payer: BC Managed Care – PPO | Admitting: Cardiology

## 2016-06-13 ENCOUNTER — Telehealth: Payer: Self-pay | Admitting: *Deleted

## 2016-06-13 NOTE — Telephone Encounter (Signed)
-----   Message from Satira Sark, MD sent at 06/13/2016  7:50 AM EDT ----- She can be PRN with [place] and continue to follow with Dr. Luan Pulling otherwise.  ----- Message -----    From: Massie Maroon, CMA    Sent: 06/12/2016   2:33 PM      To: Satira Sark, MD  When did you want to f/u with this pt or prn?  Apolo Cutshaw

## 2016-06-22 ENCOUNTER — Other Ambulatory Visit (INDEPENDENT_AMBULATORY_CARE_PROVIDER_SITE_OTHER): Payer: Self-pay | Admitting: Internal Medicine

## 2016-06-22 ENCOUNTER — Telehealth (INDEPENDENT_AMBULATORY_CARE_PROVIDER_SITE_OTHER): Payer: Self-pay | Admitting: Internal Medicine

## 2016-06-22 ENCOUNTER — Telehealth (INDEPENDENT_AMBULATORY_CARE_PROVIDER_SITE_OTHER): Payer: Self-pay | Admitting: *Deleted

## 2016-06-22 DIAGNOSIS — K219 Gastro-esophageal reflux disease without esophagitis: Secondary | ICD-10-CM

## 2016-06-22 NOTE — Telephone Encounter (Signed)
You saw patient back in January and we sch'd EGD for March, patient resch'd to it June and then canceled to due some health issues -- she has called back today wanting to get EGD back on the schedule, do you want to see her in office to re-evaluate her or do you want me to get her rescheduled for EGD

## 2016-06-22 NOTE — Telephone Encounter (Signed)
error 

## 2016-06-22 NOTE — Telephone Encounter (Signed)
Go ahead and schedule her for EGD.

## 2016-06-22 NOTE — Telephone Encounter (Signed)
I need order put in

## 2016-06-22 NOTE — Telephone Encounter (Signed)
done

## 2016-06-23 NOTE — Telephone Encounter (Signed)
Offered patient next available of 08/10/16, she will call me back to to schedule

## 2016-06-30 ENCOUNTER — Other Ambulatory Visit (HOSPITAL_COMMUNITY): Payer: BC Managed Care – PPO

## 2016-07-04 ENCOUNTER — Other Ambulatory Visit (HOSPITAL_COMMUNITY): Payer: Self-pay | Admitting: Pulmonary Disease

## 2016-07-04 DIAGNOSIS — M25511 Pain in right shoulder: Secondary | ICD-10-CM

## 2016-07-10 ENCOUNTER — Telehealth: Payer: Self-pay | Admitting: Internal Medicine

## 2016-07-14 ENCOUNTER — Ambulatory Visit (HOSPITAL_COMMUNITY)
Admission: RE | Admit: 2016-07-14 | Discharge: 2016-07-14 | Disposition: A | Discharge status: Home / Self Care | Payer: BC Managed Care – PPO | Source: Ambulatory Visit | Attending: Pulmonary Disease | Admitting: Pulmonary Disease

## 2016-07-14 DIAGNOSIS — M25511 Pain in right shoulder: Secondary | ICD-10-CM | POA: Insufficient documentation

## 2016-07-14 DIAGNOSIS — M19011 Primary osteoarthritis, right shoulder: Secondary | ICD-10-CM | POA: Diagnosis not present

## 2016-07-20 NOTE — Telephone Encounter (Signed)
Dr. Henrene Pastor declined to accept patient.  Said she would need to follow up with [facility].  LM on Vmail for Pt to CB

## 2016-08-14 ENCOUNTER — Telehealth: Payer: Self-pay | Admitting: Cardiology

## 2016-08-14 NOTE — Telephone Encounter (Signed)
Pt needs cortisone shot in shoulder and says it causes tachycardia ,and it it listed as an allergy.I advised her to speak with MD regarding this Samantha Conley says she will decline shot

## 2016-08-14 NOTE — Telephone Encounter (Signed)
Has a question about medication that a doctor wants her to take

## 2016-09-12 ENCOUNTER — Other Ambulatory Visit (HOSPITAL_COMMUNITY): Payer: Self-pay | Admitting: Family Medicine

## 2016-09-12 ENCOUNTER — Ambulatory Visit (HOSPITAL_COMMUNITY)
Admission: RE | Admit: 2016-09-12 | Discharge: 2016-09-12 | Disposition: A | Discharge status: Home / Self Care | Payer: BC Managed Care – PPO | Source: Ambulatory Visit | Attending: Family Medicine | Admitting: Family Medicine

## 2016-09-12 DIAGNOSIS — M5137 Other intervertebral disc degeneration, lumbosacral region: Secondary | ICD-10-CM | POA: Insufficient documentation

## 2016-09-12 DIAGNOSIS — M47812 Spondylosis without myelopathy or radiculopathy, cervical region: Secondary | ICD-10-CM | POA: Diagnosis not present

## 2016-09-12 DIAGNOSIS — M549 Dorsalgia, unspecified: Secondary | ICD-10-CM

## 2016-09-12 DIAGNOSIS — M545 Low back pain: Secondary | ICD-10-CM | POA: Diagnosis present

## 2016-09-12 DIAGNOSIS — M5136 Other intervertebral disc degeneration, lumbar region: Secondary | ICD-10-CM | POA: Insufficient documentation

## 2016-09-12 DIAGNOSIS — M5134 Other intervertebral disc degeneration, thoracic region: Secondary | ICD-10-CM | POA: Insufficient documentation

## 2016-09-16 ENCOUNTER — Other Ambulatory Visit: Payer: Self-pay | Admitting: Family Medicine

## 2016-09-16 DIAGNOSIS — R222 Localized swelling, mass and lump, trunk: Secondary | ICD-10-CM

## 2016-09-27 ENCOUNTER — Other Ambulatory Visit: Payer: BC Managed Care – PPO

## 2016-10-03 ENCOUNTER — Ambulatory Visit (INDEPENDENT_AMBULATORY_CARE_PROVIDER_SITE_OTHER): Payer: BC Managed Care – PPO | Admitting: Internal Medicine

## 2016-10-04 ENCOUNTER — Ambulatory Visit
Admission: RE | Admit: 2016-10-04 | Discharge: 2016-10-04 | Disposition: A | Discharge status: Home / Self Care | Payer: BC Managed Care – PPO | Source: Ambulatory Visit | Attending: Family Medicine | Admitting: Family Medicine

## 2016-10-04 DIAGNOSIS — R222 Localized swelling, mass and lump, trunk: Secondary | ICD-10-CM

## 2016-10-24 ENCOUNTER — Other Ambulatory Visit: Payer: Self-pay | Admitting: Family

## 2016-10-24 DIAGNOSIS — M5441 Lumbago with sciatica, right side: Secondary | ICD-10-CM

## 2016-10-25 DIAGNOSIS — K219 Gastro-esophageal reflux disease without esophagitis: Secondary | ICD-10-CM | POA: Insufficient documentation

## 2016-11-08 ENCOUNTER — Other Ambulatory Visit: Payer: BC Managed Care – PPO

## 2016-11-16 ENCOUNTER — Other Ambulatory Visit: Payer: Self-pay | Admitting: Nephrology

## 2016-11-16 DIAGNOSIS — N183 Chronic kidney disease, stage 3 unspecified: Secondary | ICD-10-CM

## 2016-11-23 ENCOUNTER — Ambulatory Visit
Admission: RE | Admit: 2016-11-23 | Discharge: 2016-11-23 | Disposition: A | Discharge status: Home / Self Care | Payer: BC Managed Care – PPO | Source: Ambulatory Visit | Attending: Nephrology | Admitting: Nephrology

## 2016-11-23 DIAGNOSIS — N183 Chronic kidney disease, stage 3 unspecified: Secondary | ICD-10-CM

## 2016-11-29 ENCOUNTER — Other Ambulatory Visit: Payer: Self-pay | Admitting: Family

## 2016-11-29 DIAGNOSIS — Z1231 Encounter for screening mammogram for malignant neoplasm of breast: Secondary | ICD-10-CM

## 2017-01-19 ENCOUNTER — Ambulatory Visit
Admission: RE | Admit: 2017-01-19 | Discharge: 2017-01-19 | Disposition: A | Discharge status: Home / Self Care | Payer: BC Managed Care – PPO | Source: Ambulatory Visit | Attending: Family | Admitting: Family

## 2017-01-19 DIAGNOSIS — Z1231 Encounter for screening mammogram for malignant neoplasm of breast: Secondary | ICD-10-CM

## 2017-03-08 ENCOUNTER — Encounter: Payer: BC Managed Care – PPO | Attending: Nephrology | Admitting: Nutrition

## 2017-03-08 VITALS — Ht 64.5 in | Wt 161.0 lb

## 2017-03-08 DIAGNOSIS — N289 Disorder of kidney and ureter, unspecified: Secondary | ICD-10-CM | POA: Diagnosis not present

## 2017-03-08 DIAGNOSIS — E669 Obesity, unspecified: Secondary | ICD-10-CM | POA: Diagnosis not present

## 2017-03-08 DIAGNOSIS — N183 Chronic kidney disease, stage 3 unspecified: Secondary | ICD-10-CM

## 2017-03-08 DIAGNOSIS — Z713 Dietary counseling and surveillance: Secondary | ICD-10-CM | POA: Diagnosis not present

## 2017-03-08 NOTE — Patient Instructions (Addendum)
Goals 1. Follow Plate Method 2. Use Samantha Conley and cut out salt substitutes 3. Limit sodium per serving to less than 140 mg/svg 4. Increase low carb vegetables to 2 serving per meal 5. Drink 4+ bottles of water Limit sodium to less than 2000 mg or less per day

## 2017-03-08 NOTE — Progress Notes (Signed)
  Medical Nutrition Therapy:  Appt start time: 1130 end time:  1230.   Assessment:  Primary concerns today: Lives by herself. Eats 3 meals per day. Eats mostly at home. Eats out once a week. PMH: CKD Stg 3.. Doesn't like to eat a lot meat. Doesn't like to cook. Eats more convenient and processed foods. Loves cheese. Has been trying to avoid salty foods.  Diet is higher processed foods and low in fresh fruits and vegetables and whole grains. Diet is higher I in sodium at times due to foods eaten because she doesn't like to cook. Just lost her job and will be getting a new one soon.  She notes since she is trying to avoid salty foods, she has noticed she craves sweets now.  CMP Latest Ref Rng & Units 05/02/2016 05/26/2015 11/29/2014  Glucose 65 - 99 mg/dL 105(H) 102(H) 111(H)  BUN 6 - 20 mg/dL 18 16 8   Creatinine 0.44 - 1.00 mg/dL 1.33(H) 1.55(H) 1.23(H)  Sodium 135 - 145 mmol/L 142 143 144  Potassium 3.5 - 5.1 mmol/L 3.5 4.4 3.6  Chloride 101 - 111 mmol/L 109 109 111  CO2 22 - 32 mmol/L 25 26 26   Calcium 8.9 - 10.3 mg/dL 10.4(H) 9.9 9.7  Total Protein 6.5 - 8.1 g/dL 7.6 - 6.5  Total Bilirubin 0.3 - 1.2 mg/dL 1.3(H) - 0.6  Alkaline Phos 38 - 126 U/L 80 - 61  AST 15 - 41 U/L 29 - 18  ALT 14 - 54 U/L 18 - 14     Preferred Learning Style:     Visual    Learning Readiness:  Ready  Change in progress   MEDICATIONS:    DIETARY INTAKE:     24-hr recall:  B ( AM): 2 apples or yogurt and oatmeal Snk ( AM):    L ( PM): [place] stews, apple and ice cream,  Snk ( PM):  D ( PM): sherbet and grapes, toss salad, raisin, cheese, cukes, ranch dressing Snk ( PM): vanilla wafers  Beverages: water  Usual physical activity: ADL  Estimated energy needs: 1200-1500  calories 135  g carbohydrates 90 g protein 33 g fat  Progress Towards Goal(s):  In progress.   Nutritional Diagnosis:  NI-5.10.2 Excessive mineral intake (specify): Sodium As related to CKD.  As evidenced by Stg 3  CKD.    Intervention:   Low Salt HIgh fiber diet, using Mrs. Dash, My Plate, portio sizes, need for more fresh fruits, vegetables and less processed foods. Discussed function of kidney and impact of a higher salt diet has on the kidney. Reviewed reading food labels.  Goals 1. Follow Plate Method 2. Use Mrs Deliah Boston and cut out salt substitutes 3. Limit sodium per serving to less than 140 mg/svg 4. Increase low carb vegetables to 2 serving per meal 5. Drink 4+ bottles of water Limit sodium to less than 2000 mg or less per day  Teaching Method Utilized:  Visual Auditory Hands on  Handouts given during visit include:  The Plate Method  Low Sodium Diet   Barriers to learning/adherence to lifestyle change: none  Demonstrated degree of understanding via:  Teach Back   Monitoring/Evaluation:  Dietary intake, exercise, meal planning, and body weight prn.

## 2017-04-16 ENCOUNTER — Encounter (HOSPITAL_COMMUNITY): Payer: Self-pay | Admitting: Emergency Medicine

## 2017-04-16 ENCOUNTER — Ambulatory Visit (HOSPITAL_COMMUNITY)
Admission: EM | Admit: 2017-04-16 | Discharge: 2017-04-16 | Disposition: A | Discharge status: Home / Self Care | Payer: BC Managed Care – PPO | Attending: Internal Medicine | Admitting: Internal Medicine

## 2017-04-16 DIAGNOSIS — S61215A Laceration without foreign body of left ring finger without damage to nail, initial encounter: Secondary | ICD-10-CM | POA: Diagnosis not present

## 2017-04-16 DIAGNOSIS — Z23 Encounter for immunization: Secondary | ICD-10-CM

## 2017-04-16 MED ORDER — TETANUS-DIPHTH-ACELL PERTUSSIS 5-2.5-18.5 LF-MCG/0.5 IM SUSP
INTRAMUSCULAR | Status: AC
  Filled 2017-04-16: qty 0.5

## 2017-04-16 MED ORDER — TETANUS-DIPHTH-ACELL PERTUSSIS 5-2.5-18.5 LF-MCG/0.5 IM SUSP
0.5000 mL | Freq: Once | INTRAMUSCULAR | Status: AC
  Administered 2017-04-16: 0.5 mL via INTRAMUSCULAR

## 2017-04-16 NOTE — Discharge Instructions (Signed)
Your wound has been cleaned, and closed with tissue adhesive. We have updated her tetanus vaccine. Keep your wound clean, and dry. If you see any redness, increasing pain, swelling, or pus, return to clinic as these are signs of infection. He seen any red streaking up your arms, go to the emergency room as soon as possible.

## 2017-04-16 NOTE — ED Provider Notes (Signed)
CSN: [id number]     Arrival date & time 04/16/17  1946 History   None    Chief Complaint  Patient presents with  . Laceration   (Consider location/radiation/quality/duration/timing/severity/associated sxs/prior Treatment) The history is provided by the patient.  Laceration  Location:  Finger Finger laceration location:  L ring finger Length:  2 cm Depth:  Through dermis Quality: straight   Bleeding: controlled with pressure   Time since incident:  5 hours Injury mechanism: pruning shears. Pain details:    Quality:  Dull   Severity:  Mild   Timing:  Intermittent   Progression:  Unchanged Foreign body present:  No foreign bodies Relieved by:  None tried Worsened by:  Movement Ineffective treatments:  None tried Tetanus status:  Out of date Associated symptoms: no numbness, no redness, no swelling and no streaking     Past Medical History:  Diagnosis Date  . Bipolar disorder ([place])   . Chronic kidney disease (CKD), stage III (moderate)   . Essential hypertension   . Gout   . Insomnia   . Lithium toxicity    Past Surgical History:  Procedure Laterality Date  . CATARACT EXTRACTION, BILATERAL     Lens implant bilaterally  . CHOLECYSTECTOMY    . NASAL SINUS SURGERY  2014  . TONSILLECTOMY    . TOTAL ABDOMINAL HYSTERECTOMY     Family History  Problem Relation Age of Onset  . CAD Mother   . CVA Mother   . CAD Father   . Parkinsonism Father    Social History  Substance Use Topics  . Smoking status: Never Smoker  . Smokeless tobacco: Never Used  . Alcohol use No   OB History    No data available     Review of Systems  Constitutional: Negative.   HENT: Negative.   Respiratory: Negative.   Cardiovascular: Negative.   Gastrointestinal: Negative.   Musculoskeletal: Negative.   Skin: Positive for wound.  Neurological: Negative.     Allergies  Benadryl [diphenhydramine hcl]; Codeine; Cortisone; Doxycycline; Morphine and related; and Promethazine  Home  Medications   Prior to Admission medications   Medication Sig Start Date End Date Taking? Authorizing Provider  acetaminophen (TYLENOL) 500 MG tablet Take 500 mg by mouth every 6 (six) hours as needed for mild pain.    [provider]  allopurinol (ZYLOPRIM) 300 MG tablet Take 300 mg by mouth at bedtime.     [provider]  amLODipine (NORVASC) 5 MG tablet Take 5 mg by mouth at bedtime.     [provider]  citalopram (CELEXA) 10 MG tablet Take 1 tablet (10 mg total) by mouth daily. Patient taking differently: Take 5-10 mg by mouth every morning.  04/25/14   Sinda Du, MD  divalproex (DEPAKOTE ER) 250 MG 24 hr tablet Take 250 mg by mouth every evening.     [provider]  doxepin (SINEQUAN) 50 MG capsule Take 50 mg by mouth at bedtime.    [provider]  polyethylene glycol (MIRALAX / GLYCOLAX) packet Take 17 g by mouth daily as needed for mild constipation.    [provider]  ranitidine (ZANTAC) 150 MG/10ML syrup Take by mouth 2 (two) times daily.    [provider]   Meds Ordered and Administered this Visit   Medications  Tdap (BOOSTRIX) injection 0.5 mL (0.5 mLs Intramuscular Given 04/16/17 2107)    BP 132/73 (BP Location: Right Arm)   Pulse 74   Temp 98.6 F (37  C) (Oral)   Resp 16   SpO2 96%  No data found.   Physical Exam  Constitutional: She is oriented to person, place, and time. She appears well-developed and well-nourished. No distress.  HENT:  Head: Normocephalic and atraumatic.  Right Ear: External ear normal.  Left Ear: External ear normal.  Eyes: Conjunctivae are normal.  Neck: Normal range of motion.  Neurological: She is alert and oriented to person, place, and time.  Skin: Skin is warm and dry. Capillary refill takes less than 2 seconds. No rash noted. She is not diaphoretic. No erythema.  1 cm laceration left ring finger the dermis bleeding controlled prior to arrival to the clinic. No  evidence of foreign bodies.  Psychiatric: She has a normal mood and affect. Her behavior is normal.  Nursing note and vitals reviewed.   Urgent Care Course     .Marland KitchenLaceration Repair Date/Time: 04/16/2017 9:07 PM Performed by: Barnet Glasgow Authorized by: Sherlene Shams   Consent:    Consent obtained:  Verbal   Consent given by:  Patient   Risks discussed:  Infection, pain, poor cosmetic result, poor wound healing and need for additional repair   Alternatives discussed:  No treatment Anesthesia (see MAR for exact dosages):    Anesthesia method:  None Laceration details:    Location:  Finger   Finger location:  L ring finger   Length (cm):  1   Depth (mm):  2 Repair type:    Repair type:  Simple Exploration:    Wound extent: no foreign bodies/material noted, no muscle damage noted and no nerve damage noted     Contaminated: no   Treatment:    Area cleansed with:  Shur-Clens   Amount of cleaning:  Standard   Visualized foreign bodies/material removed: no   Skin repair:    Repair method:  Tissue adhesive Approximation:    Approximation:  Close Post-procedure details:    Dressing:  Open (no dressing)   (including critical care time)  Labs Review Labs Reviewed - No data to display  Imaging Review No results found.      MDM   1. Laceration of left ring finger without foreign body without damage to nail, initial encounter    Wound cleaned, closed with Dermabond. Wound care and counseling provided, return to clinic as needed, go to the ER at any time there is red streaking.    Barnet Glasgow, NP 04/16/17 2132

## 2017-04-16 NOTE — ED Triage Notes (Signed)
The patient presented to the [facility] with a complaint of a laceration to the ring finger on her left hand that occurred today with a pair of gardening shears. The patient advise that her Tdap was not current.

## 2017-04-17 ENCOUNTER — Telehealth: Payer: Self-pay | Admitting: Family Medicine

## 2017-04-17 NOTE — Telephone Encounter (Signed)
Patient was seen at Urgent Care last night for a laceration to the L ring finger.  No sutures, glue was used.  She was told to f/u with doctor in 1 week.  Patient is scheduled with Dr. Meda Coffee to [facility] 05/02/17 @ 10:00.  She called to request an appt within a week. Can we accommodate sooner?

## 2017-04-17 NOTE — Telephone Encounter (Signed)
Selena see if there is an opening sooner.  Will NOT see her for wound check without establishing.

## 2017-04-20 ENCOUNTER — Ambulatory Visit (INDEPENDENT_AMBULATORY_CARE_PROVIDER_SITE_OTHER): Payer: BC Managed Care – PPO | Admitting: Family Medicine

## 2017-04-20 ENCOUNTER — Encounter: Payer: Self-pay | Admitting: Family Medicine

## 2017-04-20 VITALS — BP 130/70 | HR 84 | Temp 97.6°F | Resp 18 | Ht 65.0 in | Wt 159.1 lb

## 2017-04-20 DIAGNOSIS — K219 Gastro-esophageal reflux disease without esophagitis: Secondary | ICD-10-CM

## 2017-04-20 DIAGNOSIS — F319 Bipolar disorder, unspecified: Secondary | ICD-10-CM

## 2017-04-20 DIAGNOSIS — Z780 Asymptomatic menopausal state: Secondary | ICD-10-CM

## 2017-04-20 DIAGNOSIS — Z1159 Encounter for screening for other viral diseases: Secondary | ICD-10-CM

## 2017-04-20 DIAGNOSIS — N183 Chronic kidney disease, stage 3 unspecified: Secondary | ICD-10-CM | POA: Insufficient documentation

## 2017-04-20 DIAGNOSIS — I10 Essential (primary) hypertension: Secondary | ICD-10-CM | POA: Diagnosis not present

## 2017-04-20 NOTE — Patient Instructions (Signed)
Lab testing today No change in medicine Drink plenty of water Walk every day that you are able  See me in the fall for a welcome to medicare PE

## 2017-04-20 NOTE — Progress Notes (Signed)
Chief Complaint  Patient presents with  . Hypertension   This is a new patient. She is being treated for hypertension. Her hypertension is well controlled. She is under the care of a nephrologist. She has chronic renal failure stage III. She is careful with the diet. She is careful with her salt. She has not had any recent blood testing. No heart disease or any other complications from hypertension. She believes the renal failure came from lithium toxicity. This was a few years ago. Had placed her into acute renal failure and then when she recovered her creatinine never completely went back to normal. She'll wonder takes lithium. She has bipolar illness. She is currently managed on Depakote and Celexa. She is under the care of a psychiatrist. Patient does have some reflux disease. She is currently on ranitidine. This is working well for her. Her health maintenance is reasonably up-to-date. She hasn't had a Pap in many years.it is not indicated because she has had a hysterectomy.. Is also due for colonoscopy EGD and DEXA scan. She wants to wait to get these until her insurance changes. She agrees to blood work today. Immunizations are up-to-date. She's had a Prevnar and flu shot and shingles   Patient Active Problem List   Diagnosis Date Noted  . CRF (chronic renal failure), stage 3 (moderate) 04/20/2017  . 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 Lithium toxicity 04/25/2014  . Bipolar disease, chronic ([place]) 03/14/2014  . Hypertension 12/14/2011  . Gout 12/14/2011    Outpatient Encounter Prescriptions as of 04/20/2017  Medication Sig  . acetaminophen (TYLENOL) 500 MG tablet Take 500 mg by mouth every 6 (six) hours as needed for mild pain.  Marland Kitchen allopurinol (ZYLOPRIM) 300 MG tablet Take 300 mg by mouth at bedtime.   Marland Kitchen amLODipine (NORVASC) 5 MG tablet Take 5 mg by mouth at bedtime.   . citalopram (CELEXA) 10 MG tablet Take 1 tablet (10 mg total) by mouth daily. (Patient taking differently:  Take 5-10 mg by mouth every morning. )  . divalproex (DEPAKOTE ER) 250 MG 24 hr tablet Take 250 mg by mouth every evening.   Marland Kitchen doxepin (SINEQUAN) 50 MG capsule Take 50 mg by mouth at bedtime.  . polyethylene glycol (MIRALAX / GLYCOLAX) packet Take 17 g by mouth daily as needed for mild constipation.  . ranitidine (ZANTAC) 150 MG capsule Take 150 mg by mouth every evening.  . [DISCONTINUED] ranitidine (ZANTAC) 150 MG/10ML syrup Take by mouth 2 (two) times daily.   No facility-administered encounter medications on file as of 04/20/2017.     Past Medical History:  Diagnosis Date  . Allergy   . Anemia   . Anxiety   . Arthritis    back  . Bipolar disorder ([place])   . Bipolar disorder, unspecified ([place]) 04/19/2014  . Cataract   . chemical imbalance 12/14/2011  . Chronic kidney disease (CKD), stage III (moderate)   . Dehydration 04/25/2014  . Dehydration, severe 03/12/2014  . Depression   . Diarrhea 03/12/2014  . Essential hypertension   . GERD (gastroesophageal reflux disease)   . Gout   . Hypercalcemia 03/12/2014  . Insomnia   . Lithium toxicity     Past Surgical History:  Procedure Laterality Date  . CATARACT EXTRACTION, BILATERAL     Lens implant bilaterally  . CHOLECYSTECTOMY    . NASAL SINUS SURGERY  2014  . TONSILLECTOMY    . TOTAL ABDOMINAL HYSTERECTOMY     endometriosis, bleeding    Social History  Social History  . Marital status: Single    Spouse name: N/A  . Number of children: 0  . Years of education: 20   Occupational History  . retired Archivist   Social History Main Topics  . Smoking status: Never Smoker  . Smokeless tobacco: Never Used  . Alcohol use No  . Drug use: No  . Sexual activity: Not Currently   Other Topics Concern  . Not on file   Social History Narrative   Lives alone with two pets,   Never married   Two masters degrees   Worked for the state          Family History  Problem Relation Age of Onset  . CAD Mother     . CVA Mother   . Heart disease Mother   . Hyperlipidemia Mother   . Hypertension Mother   . Stroke Mother   . CAD Father   . Parkinsonism Father   . Cancer Father        melanoma  . Heart disease Father   . Cancer Brother        prostate  . Cancer Brother        melanoma  . Stroke Maternal Grandmother   . Heart disease Maternal Grandfather   . Cancer Paternal Grandmother        colon  . Heart disease Paternal Grandfather     Review of Systems  Constitutional: Negative for chills, fever and weight loss.  HENT: Negative for congestion and hearing loss.   Eyes: Negative for blurred vision and pain.  Respiratory: Negative for cough and shortness of breath.   Cardiovascular: Negative for chest pain and leg swelling.  Gastrointestinal: Negative for abdominal pain, constipation, diarrhea and heartburn.  Genitourinary: Negative for dysuria and frequency.  Musculoskeletal: Negative for falls, joint pain and myalgias.  Neurological: Negative for dizziness, seizures and headaches.  Psychiatric/Behavioral: Negative for depression. The patient is not nervous/anxious and does not have insomnia.     BP 130/70 (BP Location: Right Arm, Patient Position: Sitting, Cuff Size: Normal)   Pulse 84   Temp 97.6 F (36.4 C) (Temporal)   Resp 18   Ht 5\' 5"  (1.651 m)   Wt 159 lb 1.3 oz (72.2 kg)   SpO2 97%   BMI 26.47 kg/m   Physical Exam  Constitutional: She is oriented to person, place, and time. She appears well-developed and well-nourished.  HENT:  Head: Normocephalic and atraumatic.  Mouth/Throat: Oropharynx is clear and moist.  Eyes: Conjunctivae are normal. Pupils are equal, round, and reactive to light.  Neck: Normal range of motion. Neck supple. No thyromegaly present.  Cardiovascular: Normal rate, regular rhythm and normal heart sounds.   Pulmonary/Chest: Effort normal and breath sounds normal. No respiratory distress.  Abdominal: Soft. Bowel sounds are normal.   Musculoskeletal:  Symmetric strength in extremities normal gait  Lymphadenopathy:    She has no cervical adenopathy.  Neurological: She is alert and oriented to person, place, and time.  Skin: Skin is warm and dry.  Psychiatric: She has a normal mood and affect. Her behavior is normal. Thought content normal.  Nursing note and vitals reviewed. Greater than 50% of this visit was spent in counseling and coordinating care.  Total face to face time:   45 minutes. Discussion about woman's wellness, osteoporosis prevention, cancer screenings, immunizations, diet and exercise.   ASSESSMENT/PLAN:  1. CRF (chronic renal failure), stage 3 (moderate)  - CBC -  COMPLETE METABOLIC PANEL WITH GFR - Lipid panel  2. Essential hypertension  - CBC - COMPLETE METABOLIC PANEL WITH GFR - Lipid panel - Urinalysis, Routine w reflex microscopic  3. Bipolar disease, chronic ([place])   4. Laryngopharyngeal reflux (LPR)   5. Post-menopausal  - VITAMIN D 25 Hydroxy (Vit-D Deficiency, Fractures) 6. Screen for hepatitis C in low risk individual - hepatitis C   Patient Instructions  Lab testing today No change in medicine Drink plenty of water Walk every day that you are able  See me in the fall for a welcome to medicare PE    Raylene Everts, MD

## 2017-04-23 ENCOUNTER — Other Ambulatory Visit: Payer: Self-pay | Admitting: Family Medicine

## 2017-04-26 LAB — CMP13+6AC+CBC/D/PLT
ALT: 17 IU/L (ref 0–32)
AST: 22 IU/L (ref 0–40)
Albumin: 4.5 g/dL (ref 3.6–4.8)
Alkaline Phosphatase: 99 IU/L (ref 39–117)
BUN/Creatinine Ratio: 13 (ref 12–28)
BUN: 20 mg/dL (ref 8–27)
Basophils Absolute: 0.1 10*3/uL (ref 0.0–0.2)
Basos: 1 %
Bilirubin Total: 1 mg/dL (ref 0.0–1.2)
CALCIUM: 10.4 mg/dL — AB (ref 8.7–10.3)
CHLORIDE: 106 mmol/L (ref 96–106)
CO2: 22 mmol/L (ref 18–29)
CREATININE: 1.5 mg/dL — AB (ref 0.57–1.00)
Cholesterol, Total: 207 mg/dL — ABNORMAL HIGH (ref 100–199)
EOS (ABSOLUTE): 0.3 10*3/uL (ref 0.0–0.4)
Eos: 6 %
GFR calc Af Amer: 42 mL/min/{1.73_m2} — ABNORMAL LOW (ref >59)
GFR calc non Af Amer: 37 mL/min/{1.73_m2} — ABNORMAL LOW (ref >59)
GLUCOSE: 104 mg/dL — AB (ref 65–99)
Hematocrit: 39.3 % (ref 34.0–46.6)
Hemoglobin: 13 g/dL (ref 11.1–15.9)
IMMATURE GRANS (ABS): 0 10*3/uL (ref 0.0–0.1)
Immature Granulocytes: 0 %
LDH: 168 IU/L (ref 119–226)
LYMPHS: 49 %
Lymphocytes Absolute: 2.4 10*3/uL (ref 0.7–3.1)
MCH: 29.5 pg (ref 26.6–33.0)
MCHC: 33.1 g/dL (ref 31.5–35.7)
MCV: 89 fL (ref 79–97)
MONOCYTES: 6 %
Monocytes Absolute: 0.3 10*3/uL (ref 0.1–0.9)
NEUTROS ABS: 1.9 10*3/uL (ref 1.4–7.0)
Neutrophils: 38 %
PHOSPHORUS: 3.8 mg/dL (ref 2.5–4.5)
PLATELETS: 243 10*3/uL (ref 150–379)
Potassium: 4.4 mmol/L (ref 3.5–5.2)
RBC: 4.4 x10E6/uL (ref 3.77–5.28)
RDW: 13.6 % (ref 12.3–15.4)
SODIUM: 143 mmol/L (ref 134–144)
TOTAL PROTEIN: 6.6 g/dL (ref 6.0–8.5)
Triglycerides: 102 mg/dL (ref 0–149)
URIC ACID: 3.1 mg/dL (ref 2.5–7.1)
WBC: 4.9 10*3/uL (ref 3.4–10.8)

## 2017-04-26 LAB — URINALYSIS, ROUTINE W REFLEX MICROSCOPIC
BILIRUBIN UA: NEGATIVE
GLUCOSE, UA: NEGATIVE
Ketones, UA: NEGATIVE
NITRITE UA: NEGATIVE
PH UA: 7 (ref 5.0–7.5)
PROTEIN UA: NEGATIVE
RBC UA: NEGATIVE
Specific Gravity, UA: 1.007 (ref 1.005–1.030)
UUROB: 0.2 mg/dL (ref 0.2–1.0)

## 2017-04-26 LAB — MICROSCOPIC EXAMINATION
Bacteria, UA: NONE SEEN
CASTS: NONE SEEN /LPF

## 2017-04-26 LAB — VITAMIN D 25 HYDROXY (VIT D DEFICIENCY, FRACTURES): Vit D, 25-Hydroxy: 25.7 ng/mL — ABNORMAL LOW (ref 30.0–100.0)

## 2017-04-26 LAB — LIPID PANEL W/O CHOL/HDL RATIO
HDL: 61 mg/dL (ref >39)
LDL CALC: 126 mg/dL — AB (ref 0–99)
VLDL CHOLESTEROL CAL: 20 mg/dL (ref 5–40)

## 2017-04-26 LAB — AMBIG ABBREV LP DEFAULT

## 2017-04-26 LAB — AMBIG ABBREV CMP14 DEFAULT

## 2017-04-27 ENCOUNTER — Encounter: Payer: Self-pay | Admitting: Family Medicine

## 2017-04-27 DIAGNOSIS — E782 Mixed hyperlipidemia: Secondary | ICD-10-CM | POA: Insufficient documentation

## 2017-04-27 DIAGNOSIS — E559 Vitamin D deficiency, unspecified: Secondary | ICD-10-CM | POA: Insufficient documentation

## 2017-04-27 DIAGNOSIS — E785 Hyperlipidemia, unspecified: Secondary | ICD-10-CM

## 2017-04-27 HISTORY — DX: Hyperlipidemia, unspecified: E78.5

## 2017-05-02 ENCOUNTER — Ambulatory Visit: Payer: BC Managed Care – PPO | Admitting: Family Medicine

## 2017-05-07 ENCOUNTER — Ambulatory Visit (INDEPENDENT_AMBULATORY_CARE_PROVIDER_SITE_OTHER): Payer: BC Managed Care – PPO | Admitting: Family Medicine

## 2017-05-07 ENCOUNTER — Encounter: Payer: Self-pay | Admitting: Family Medicine

## 2017-05-07 VITALS — BP 136/70 | HR 76 | Temp 97.1°F | Resp 18 | Ht 65.0 in | Wt 157.1 lb

## 2017-05-07 DIAGNOSIS — R202 Paresthesia of skin: Secondary | ICD-10-CM

## 2017-05-07 DIAGNOSIS — R200 Anesthesia of skin: Secondary | ICD-10-CM | POA: Diagnosis not present

## 2017-05-07 NOTE — Progress Notes (Signed)
Chief Complaint  Patient presents with  . Numbness    left side of face x 3-4 days   New problem Numb "tingly" feeling left cheek off and on for 4 days NO headache, no weakness, no vision impairment No injury Normal use of arms and legs Otherwise well  Patient Active Problem List   Diagnosis Date Noted  . Vitamin D deficiency 04/27/2017  . HLD (hyperlipidemia) 04/27/2017  . CRF (chronic renal failure), stage 3 (moderate) 04/20/2017  . 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 Lithium toxicity 04/25/2014  . Bipolar disease, chronic ([place]) 03/14/2014  . Hypertension 12/14/2011  . Gout 12/14/2011    Outpatient Encounter Prescriptions as of 05/07/2017  Medication Sig  . acetaminophen (TYLENOL) 500 MG tablet Take 500 mg by mouth every 6 (six) hours as needed for mild pain.  Marland Kitchen allopurinol (ZYLOPRIM) 300 MG tablet Take 300 mg by mouth at bedtime.   Marland Kitchen amLODipine (NORVASC) 5 MG tablet Take 5 mg by mouth at bedtime.   . citalopram (CELEXA) 10 MG tablet Take 1 tablet (10 mg total) by mouth daily. (Patient taking differently: Take 5-10 mg by mouth every morning. )  . divalproex (DEPAKOTE ER) 250 MG 24 hr tablet Take 250 mg by mouth every evening.   Marland Kitchen doxepin (SINEQUAN) 50 MG capsule Take 50 mg by mouth at bedtime.  . polyethylene glycol (MIRALAX / GLYCOLAX) packet Take 17 g by mouth daily as needed for mild constipation.  . ranitidine (ZANTAC) 150 MG capsule Take 150 mg by mouth every evening.   No facility-administered encounter medications on file as of 05/07/2017.     Allergies  Allergen Reactions  . Lidocaine     tachycardia  . Benadryl [Diphenhydramine Hcl] Other (See Comments)    Not allergic, but makes her hyper  . Codeine Other (See Comments)    Makes states it makes her "spacey"  . Cortisone Anxiety    anxiety  . Doxycycline Diarrhea    Severe diarrhea  . Morphine And Related Nausea And Vomiting  . Promethazine Other (See Comments)    Patient states she is not  allergic but it makes her  "spacey and not in control"    Review of Systems  Constitutional: Negative for activity change, appetite change and fever.  HENT: Negative for congestion, dental problem, sinus pain, sinus pressure, trouble swallowing and voice change.   Eyes: Negative for photophobia and visual disturbance.  Respiratory: Negative for cough and shortness of breath.   Cardiovascular: Negative for chest pain and palpitations.  Gastrointestinal: Negative for nausea and vomiting.  Musculoskeletal: Negative for neck pain and neck stiffness.  Skin: Negative for rash.  Neurological: Positive for numbness. Negative for dizziness, facial asymmetry and weakness.  Psychiatric/Behavioral: Negative for confusion and decreased concentration.    BP 136/70 (BP Location: Right Arm, Patient Position: Sitting, Cuff Size: Normal)   Pulse 76   Temp 97.1 F (36.2 C) (Temporal)   Resp 18   Ht 5\' 5"  (1.651 m)   Wt 157 lb 1.9 oz (71.3 kg)   SpO2 98%   BMI 26.15 kg/m   Physical Exam  Constitutional: She is oriented to person, place, and time. She appears well-developed and well-nourished. No distress.  HENT:  Head: Normocephalic and atraumatic.  Right Ear: External ear normal.  Left Ear: External ear normal.  Nose: Nose normal.  Mouth/Throat: Oropharynx is clear and moist.  Can feel light touch throughout  Eyes: Conjunctivae are normal. Pupils are equal, round, and reactive to  light.  Discs flat  Neck: Normal range of motion. No thyromegaly present.  Cardiovascular: Normal rate, regular rhythm and normal heart sounds.   Pulmonary/Chest: Effort normal and breath sounds normal.  Musculoskeletal: Normal range of motion. She exhibits no edema.  Lymphadenopathy:    She has no cervical adenopathy.  Neurological: She is alert and oriented to person, place, and time. She displays normal reflexes. No cranial nerve deficit. Coordination normal.  Psychiatric: She has a normal mood and affect. Her  behavior is normal.    ASSESSMENT/PLAN:  1. Numbness and tingling of left side of face Normal exam Discussed neuritis - inflamed for some reason, no evidence of stroke or bells palsy Take baby ASA  Patient Instructions  Call if worse at any time   Raylene Everts, MD

## 2017-05-07 NOTE — Patient Instructions (Signed)
Call if worse at any time

## 2017-06-19 ENCOUNTER — Telehealth: Payer: Self-pay | Admitting: Nutrition

## 2017-06-19 NOTE — Telephone Encounter (Signed)
vm to call and reschedule canceled appt

## 2017-07-24 ENCOUNTER — Telehealth: Payer: Self-pay | Admitting: Family Medicine

## 2017-07-24 NOTE — Telephone Encounter (Signed)
Patient had a morning appointment scheduled for Oct 19th but called to change it to the afternoon at 4.  She stated the appt is for FLU shot, but AVS mentions a Welcome to [employer]. Please advise when to schedule this.  Thanks

## 2017-07-25 ENCOUNTER — Telehealth: Payer: Self-pay | Admitting: Family Medicine

## 2017-07-25 NOTE — Telephone Encounter (Signed)
Correct, needs to be a 40 min welcome visit, thank you.

## 2017-07-25 NOTE — Telephone Encounter (Signed)
Sure

## 2017-07-25 NOTE — Telephone Encounter (Signed)
Patient has an appt for 10-19-8 f/u with flu shot, but she was to be schedule for an AWV. I was calling to move the appt to a 84min earlier time slot when she declined the appt altogether.  She is not interested in being seen again so soon by the doctor (says she really dislikes going to any doctor) and just wants to get a flu shot, besides "it's very hard to get off work".  She states the only reason to see the doctor is for her kidneys anyway.  So can we canc the 10-19 appt and have her come in for just the flu shot?

## 2017-08-31 ENCOUNTER — Ambulatory Visit (INDEPENDENT_AMBULATORY_CARE_PROVIDER_SITE_OTHER): Payer: BC Managed Care – PPO | Admitting: Family Medicine

## 2017-08-31 ENCOUNTER — Encounter: Payer: Self-pay | Admitting: Family Medicine

## 2017-08-31 VITALS — BP 110/64 | HR 67 | Temp 98.3°F | Resp 16 | Ht 64.5 in | Wt 151.0 lb

## 2017-08-31 DIAGNOSIS — L247 Irritant contact dermatitis due to plants, except food: Secondary | ICD-10-CM | POA: Diagnosis not present

## 2017-08-31 MED ORDER — PREDNISONE 20 MG PO TABS: ORAL_TABLET | ORAL | 0 refills | Status: DC

## 2017-08-31 NOTE — Progress Notes (Signed)
Chief Complaint  Patient presents with  . Poison Ivy   Exposed in the yard 3 d ago.  Spots breaking out hands, arms face.  She gets this terribly and wants prednisone.  This is listed as a drug allergy because it makes her jittery - she knows this but is willing to tolerate to reduce the rash and itching Nature and spread of poison ivy discussed  Patient Active Problem List   Diagnosis Date Noted  . Vitamin D deficiency 04/27/2017  . HLD (hyperlipidemia) 04/27/2017  . CRF (chronic renal failure), stage 3 (moderate) 04/20/2017  . 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 Lithium toxicity 04/25/2014  . Bipolar disease, chronic ([place]) 03/14/2014  . Hypertension 12/14/2011  . Gout 12/14/2011    Outpatient Encounter Prescriptions as of 08/31/2017  Medication Sig  . acetaminophen (TYLENOL) 500 MG tablet Take 500 mg by mouth every 6 (six) hours as needed for mild pain.  Marland Kitchen allopurinol (ZYLOPRIM) 300 MG tablet Take 300 mg by mouth at bedtime.   Marland Kitchen amLODipine (NORVASC) 5 MG tablet Take 5 mg by mouth at bedtime.   Marland Kitchen aspirin EC 81 MG tablet Take 81 mg by mouth daily.  . citalopram (CELEXA) 10 MG tablet Take 1 tablet (10 mg total) by mouth daily. (Patient taking differently: Take 5-10 mg by mouth every morning. )  . divalproex (DEPAKOTE ER) 250 MG 24 hr tablet Take 250 mg by mouth every evening.   Marland Kitchen doxepin (SINEQUAN) 50 MG capsule Take 50 mg by mouth at bedtime.  . polyethylene glycol (MIRALAX / GLYCOLAX) packet Take 17 g by mouth daily as needed for mild constipation.  . ranitidine (ZANTAC) 150 MG capsule Take 150 mg by mouth every evening.  . predniSONE (DELTASONE) 20 MG tablet Take 2 a day for 3 days then one a day for 3 days   No facility-administered encounter medications on file as of 08/31/2017.     Allergies  Allergen Reactions  . Lidocaine     tachycardia  . Benadryl [Diphenhydramine Hcl] Other (See Comments)    Not allergic, but makes her hyper  . Codeine Other (See  Comments)    Makes states it makes her "spacey"  . Cortisone Anxiety    anxiety  . Doxycycline Diarrhea    Severe diarrhea  . Morphine And Related Nausea And Vomiting  . Promethazine Other (See Comments)    Patient states she is not allergic but it makes her  "spacey and not in control"    Review of Systems  Constitutional: Negative for fatigue and fever.  Eyes: Negative for photophobia and visual disturbance.  Respiratory: Negative for shortness of breath and wheezing.   Skin: Positive for rash.  Psychiatric/Behavioral: Positive for sleep disturbance.  All other systems reviewed and are negative.  BP 110/64 (BP Location: Left Arm, Patient Position: Sitting, Cuff Size: Normal)   Pulse 67   Temp 98.3 F (36.8 C) (Other (Comment))   Resp 16   Ht 5' 4.5" (1.638 m)   Wt 151 lb (68.5 kg)   SpO2 98%   BMI 25.52 kg/m   Physical Exam  Constitutional: She is oriented to person, place, and time. She appears well-developed and well-nourished. No distress.  HENT:  Head: Normocephalic and atraumatic.  Mouth/Throat: Oropharynx is clear and moist.  Eyes: Pupils are equal, round, and reactive to light. EOM are normal.  Neck: Normal range of motion.  Cardiovascular: Normal rate and regular rhythm.   Pulmonary/Chest: Effort normal and breath sounds normal.  Lymphadenopathy:    She has no cervical adenopathy.  Neurological: She is alert and oriented to person, place, and time.  Skin:  Patches of tiny vesicles on erythematous papule on right cheek and chin, at corner of mouth, and  Few on both dorsal forearm/hands - mild exposure  Psychiatric: She has a normal mood and affect. Her behavior is normal.    ASSESSMENT/PLAN:  1. Irritant contact dermatitis due to plants, except food Discussed antihistamines, ice.heat. Lotions, care   Patient Instructions  Take the prednisone Call for problems   Raylene Everts, MD

## 2017-08-31 NOTE — Patient Instructions (Signed)
Take the prednisone Call for problems

## 2017-09-21 ENCOUNTER — Ambulatory Visit: Payer: BC Managed Care – PPO | Admitting: Family Medicine

## 2017-09-27 ENCOUNTER — Ambulatory Visit (INDEPENDENT_AMBULATORY_CARE_PROVIDER_SITE_OTHER): Payer: BC Managed Care – PPO | Admitting: Family Medicine

## 2017-09-27 ENCOUNTER — Encounter: Payer: Self-pay | Admitting: Family Medicine

## 2017-09-27 VITALS — BP 118/68 | HR 84 | Temp 96.1°F | Resp 18 | Ht 65.0 in | Wt 155.0 lb

## 2017-09-27 DIAGNOSIS — Z23 Encounter for immunization: Secondary | ICD-10-CM | POA: Diagnosis not present

## 2017-09-27 DIAGNOSIS — I10 Essential (primary) hypertension: Secondary | ICD-10-CM | POA: Diagnosis not present

## 2017-09-27 DIAGNOSIS — N183 Chronic kidney disease, stage 3 unspecified: Secondary | ICD-10-CM

## 2017-09-27 DIAGNOSIS — K219 Gastro-esophageal reflux disease without esophagitis: Secondary | ICD-10-CM | POA: Diagnosis not present

## 2017-09-27 MED ORDER — ALLOPURINOL 300 MG PO TABS: 300.0000 mg | ORAL_TABLET | Freq: Every day | ORAL | 3 refills | Status: DC

## 2017-09-27 MED ORDER — AMLODIPINE BESYLATE 5 MG PO TABS: 5.0000 mg | ORAL_TABLET | Freq: Every day | ORAL | 3 refills | Status: DC

## 2017-09-27 MED ORDER — RANITIDINE HCL 150 MG PO CAPS: 150.0000 mg | ORAL_CAPSULE | Freq: Every evening | ORAL | 3 refills | Status: DC

## 2017-09-27 NOTE — Progress Notes (Signed)
Chief Complaint  Patient presents with  . Follow-up   Here for fall immunizations BP is good Weight is stable Takes ranitidine for LRPD, needs refill.  She feel sit helps with hoarseness Wants referral to different cardiologist.  Wants to go to the doctor that took care of her parents.    will place referral with no guarantee ( his practice may be full) No acute complaint Patient Active Problem List   Diagnosis Date Noted  . Vitamin D deficiency 04/27/2017  . HLD (hyperlipidemia) 04/27/2017  . CRF (chronic renal failure), stage 3 (moderate) ([place]) 04/20/2017  . 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 Lithium toxicity 04/25/2014  . Bipolar disease, chronic ([place]) 03/14/2014  . Hypertension 12/14/2011  . Gout 12/14/2011    Outpatient Encounter Prescriptions as of 09/27/2017  Medication Sig  . acetaminophen (TYLENOL) 500 MG tablet Take 500 mg by mouth every 6 (six) hours as needed for mild pain.  Marland Kitchen allopurinol (ZYLOPRIM) 300 MG tablet Take 1 tablet (300 mg total) by mouth at bedtime.  Marland Kitchen amLODipine (NORVASC) 5 MG tablet Take 1 tablet (5 mg total) by mouth at bedtime.  Marland Kitchen aspirin EC 81 MG tablet Take 81 mg by mouth daily.  . citalopram (CELEXA) 10 MG tablet Take 1 tablet (10 mg total) by mouth daily. (Patient taking differently: Take 5-10 mg by mouth every morning. )  . divalproex (DEPAKOTE ER) 250 MG 24 hr tablet Take 250 mg by mouth every evening.   Marland Kitchen doxepin (SINEQUAN) 50 MG capsule Take 50 mg by mouth at bedtime.  . polyethylene glycol (MIRALAX / GLYCOLAX) packet Take 17 g by mouth daily as needed for mild constipation.  . ranitidine (ZANTAC) 150 MG capsule Take 1 capsule (150 mg total) by mouth every evening.   No facility-administered encounter medications on file as of 09/27/2017.     Allergies  Allergen Reactions  . Lidocaine     tachycardia  . Benadryl [Diphenhydramine Hcl] Other (See Comments)    Not allergic, but makes her hyper  . Codeine Other (See Comments)      Makes states it makes her "spacey"  . Cortisone Anxiety    anxiety  . Doxycycline Diarrhea    Severe diarrhea  . Morphine And Related Nausea And Vomiting  . Promethazine Other (See Comments)    Patient states she is not allergic but it makes her  "spacey and not in control"    Review of Systems  Constitutional: Negative for activity change, appetite change and unexpected weight change.  HENT: Negative for congestion, dental problem, postnasal drip and rhinorrhea.   Eyes: Negative for redness and visual disturbance.  Respiratory: Negative for cough and shortness of breath.   Cardiovascular: Negative for chest pain, palpitations and leg swelling.  Gastrointestinal: Negative for abdominal pain, constipation and diarrhea.  Genitourinary: Negative for difficulty urinating and frequency.  Musculoskeletal: Negative for arthralgias and back pain.  Neurological: Negative for dizziness and headaches.  Psychiatric/Behavioral: Negative for dysphoric mood and sleep disturbance. The patient is not nervous/anxious.     BP 118/68 (BP Location: Right Arm, Patient Position: Sitting, Cuff Size: Normal)   Pulse 84   Temp (!) 96.1 F (35.6 C) (Temporal)   Resp 18   Ht 5\' 5"  (1.651 m)   Wt 155 lb 0.6 oz (70.3 kg)   SpO2 99%   BMI 25.80 kg/m   Physical Exam  Constitutional: She is oriented to person, place, and time. She appears well-developed and well-nourished.  HENT:  Head:  Normocephalic and atraumatic.  Mouth/Throat: Oropharynx is clear and moist.  Eyes: Pupils are equal, round, and reactive to light. Conjunctivae are normal.  Neck: Normal range of motion. Neck supple. No thyromegaly present.  Cardiovascular: Normal rate, regular rhythm and normal heart sounds.   Pulmonary/Chest: Effort normal and breath sounds normal. No respiratory distress.  Musculoskeletal:  Normal gait  Lymphadenopathy:    She has no cervical adenopathy.  Neurological: She is alert and oriented to person, place,  and time.  Skin: Skin is warm and dry.  Psychiatric: She has a normal mood and affect. Her behavior is normal. Thought content normal.  Nursing note and vitals reviewed.   ASSESSMENT/PLAN:  1. Laryngopharyngeal reflux (LPR) Refill ranitidine  2. CRF (chronic renal failure), stage 3 (moderate) ([facility]) stable  3. Essential hypertension controlled - Ambulatory referral to Cardiology  4. Needs flu shot done - Flu Vaccine QUAD 36+ mos IM   Patient Instructions  Come back for the prevnar shot ( pneumonia) No change in medicines Continue to drink plenty of fluids  See me in six months   Raylene Everts, MD

## 2017-09-27 NOTE — Patient Instructions (Addendum)
Come back for the prevnar shot ( pneumonia) No change in medicines Continue to drink plenty of fluids  See me in six months

## 2017-12-05 ENCOUNTER — Ambulatory Visit: Payer: BC Managed Care – PPO | Admitting: Family Medicine

## 2017-12-11 ENCOUNTER — Other Ambulatory Visit: Payer: Self-pay | Admitting: Gastroenterology

## 2017-12-11 DIAGNOSIS — R1310 Dysphagia, unspecified: Secondary | ICD-10-CM

## 2017-12-13 ENCOUNTER — Other Ambulatory Visit: Payer: Self-pay | Admitting: Family Medicine

## 2017-12-13 DIAGNOSIS — Z1231 Encounter for screening mammogram for malignant neoplasm of breast: Secondary | ICD-10-CM

## 2017-12-14 ENCOUNTER — Ambulatory Visit
Admission: RE | Admit: 2017-12-14 | Discharge: 2017-12-14 | Disposition: A | Discharge status: Home / Self Care | Payer: Medicare Other | Source: Ambulatory Visit | Attending: Gastroenterology | Admitting: Gastroenterology

## 2017-12-14 DIAGNOSIS — R1310 Dysphagia, unspecified: Secondary | ICD-10-CM

## 2017-12-20 ENCOUNTER — Other Ambulatory Visit: Payer: Self-pay

## 2017-12-20 MED ORDER — ALLOPURINOL 300 MG PO TABS: 300.0000 mg | ORAL_TABLET | Freq: Every day | ORAL | 3 refills | Status: DC

## 2017-12-20 MED ORDER — RANITIDINE HCL 150 MG PO CAPS: 150.0000 mg | ORAL_CAPSULE | Freq: Every evening | ORAL | 3 refills | Status: DC

## 2017-12-20 MED ORDER — AMLODIPINE BESYLATE 5 MG PO TABS: 5.0000 mg | ORAL_TABLET | Freq: Every day | ORAL | 3 refills | Status: DC

## 2017-12-20 NOTE — Telephone Encounter (Signed)
Seen 10 25 18

## 2018-01-03 DIAGNOSIS — R079 Chest pain, unspecified: Secondary | ICD-10-CM | POA: Insufficient documentation

## 2018-01-03 NOTE — Progress Notes (Signed)
Cardiology Office Note    Date:  01/04/2018   ID:  Samantha Conley, DOB [birth year], MRN [id number]  PCP:  Raylene Everts, MD  Cardiologist: Sinclair Grooms, MD   Chief Complaint  Patient presents with  . Chest Pain    History of Present Illness:  Samantha Conley is a 67 y.o. female with strong family history of vascular disease, hyperlipidemia (untreated), prior negative ischemic workup with stress echo 1638, diastolic left ventricular dysfunction, who presents to have longitudinal preventive cardiac management.  Referred by Riki Altes, MD.  In 2017 the patient experienced sharp left chest discomfort while walking and underwent a stress echo performed under the direction of Dr. Rozann Lesches.  The study revealed normal LV function with enhancement and contractility during peak stress.  The study was negative for evidence of ischemia.  Over the last year the patient has experienced pressure in the chest with certain exertional activities.  It is been more prevalent over the past 6 months.  No recent episodes have occurred.  Past Medical History:  Diagnosis Date  . Allergy   . Anemia   . Anxiety   . Arthritis    back  . Bipolar disorder ([place])   . Bipolar disorder, unspecified ([place]) 04/19/2014  . Cataract   . chemical imbalance 12/14/2011  . Chronic kidney disease (CKD), stage III (moderate) (HCC)   . Dehydration 04/25/2014  . Dehydration, severe 03/12/2014  . Depression   . Diarrhea 03/12/2014  . Essential hypertension   . GERD (gastroesophageal reflux disease)   . Gout   . HLD (hyperlipidemia) 04/27/2017  . Hypercalcemia 03/12/2014  . Insomnia   . Lithium toxicity     Past Surgical History:  Procedure Laterality Date  . CATARACT EXTRACTION, BILATERAL     Lens implant bilaterally  . CHOLECYSTECTOMY    . NASAL SINUS SURGERY  2014  . TONSILLECTOMY    . TOTAL ABDOMINAL HYSTERECTOMY     endometriosis, bleeding    Current Medications: Outpatient Medications  Prior to Visit  Medication Sig Dispense Refill  . acetaminophen (TYLENOL) 500 MG tablet Take 500 mg by mouth every 6 (six) hours as needed for mild pain.    Marland Kitchen allopurinol (ZYLOPRIM) 300 MG tablet Take 1 tablet (300 mg total) by mouth at bedtime. 90 tablet 3  . amLODipine (NORVASC) 5 MG tablet Take 1 tablet (5 mg total) by mouth at bedtime. 90 tablet 3  . citalopram (CELEXA) 10 MG tablet Take 5 mg by mouth daily.    . divalproex (DEPAKOTE ER) 250 MG 24 hr tablet Take 250 mg by mouth 2 (two) times daily.    Marland Kitchen doxepin (SINEQUAN) 50 MG capsule Take 50 mg by mouth at bedtime.    . polyethylene glycol (MIRALAX / GLYCOLAX) packet Take 17 g by mouth daily as needed for mild constipation.    . ranitidine (ZANTAC) 150 MG capsule Take 1 capsule (150 mg total) by mouth every evening. 90 capsule 3  . aspirin EC 81 MG tablet Take 81 mg by mouth daily.    . citalopram (CELEXA) 10 MG tablet Take 1 tablet (10 mg total) by mouth daily. (Patient not taking: Reported on 01/04/2018) 30 tablet 12  . divalproex (DEPAKOTE ER) 250 MG 24 hr tablet Take 250 mg by mouth every evening.      No facility-administered medications prior to visit.      Allergies:   Lidocaine; Benadryl [diphenhydramine hcl]; Codeine; Cortisone; Doxycycline; Morphine and related; and  Promethazine   Social History   Socioeconomic History  . Marital status: Single    Spouse name: None  . Number of children: 0  . Years of education: 82  . Highest education level: None  Social Needs  . Financial resource strain: None  . Food insecurity - worry: None  . Food insecurity - inability: None  . Transportation needs - medical: None  . Transportation needs - non-medical: None  Occupational History  . Occupation: retired    Fish farm manager: [employer]    Comment: librarian  Tobacco Use  . Smoking status: Never Smoker  . Smokeless tobacco: Never Used  Substance and Sexual Activity  . Alcohol use: No    Alcohol/week: 0.0 oz  . Drug use: No  .  Sexual activity: Not Currently  Other Topics Concern  . None  Social History Narrative   Lives alone with two pets,   Never married   Two masters degrees   Worked for the state        Family History:  The patient's family history includes CAD in her father and mother; CVA in her mother; Cancer in her brother, brother, father, and paternal grandmother; Heart disease in her father, maternal grandfather, mother, and paternal grandfather; Hyperlipidemia in her mother; Hypertension in her mother; Parkinsonism in her father; Stroke in her maternal grandmother and mother.   ROS:   Please see the history of present illness.    Hearing loss, abdominal pain passive fatigue, depression, easy bruising. All other systems reviewed and are negative.   PHYSICAL EXAM:   VS:  BP 124/70   Pulse 70   Ht 5\' 4"  (1.626 m)   Wt 160 lb (72.6 kg)   BMI 27.46 kg/m    GEN: Well nourished, well developed, in no acute distress  HEENT: normal  Neck: no JVD, carotid bruits, or masses Cardiac: RRR; no murmurs, rubs, or gallops,no edema  Respiratory:  clear to auscultation bilaterally, normal work of breathing GI: soft, nontender, nondistended, + BS MS: no deformity or atrophy  Skin: warm and dry, no rash Neuro:  Alert and Oriented x 3, Strength and sensation are intact Psych: euthymic mood, full affect  Wt Readings from Last 3 Encounters:  01/04/18 160 lb (72.6 kg)  09/27/17 155 lb 0.6 oz (70.3 kg)  08/31/17 151 lb (68.5 kg)      Studies/Labs Reviewed:   EKG:  EKG low voltage, normal sinus rhythm, nonspecific ST abnormality, no change compared to February 2017  Recent Labs: 04/23/2017: ALT 17; BUN 20; Creatinine, Ser 1.50; Hemoglobin 13.0; Platelets 243; Potassium 4.4; Sodium 143   Lipid Panel    Component Value Date/Time   CHOL 207 (H) 04/23/2017 0834   TRIG 102 04/23/2017 0834   HDL 61 04/23/2017 0834   LDLCALC 126 (H) 04/23/2017 0834    Additional studies/ records that were reviewed  today include:  Stress Echocardiogram 2017: Study Conclusions   - Stress ECG conclusions: 0.5 mm horizontal ST depressions in   inferolateral leads. Isolated PVC&'s. - Staged echo: Resting LV systolic function was normal, EF 60-65%.   With stress, there was hyperdynamic contraction with augmentation   of all wall segments, EF 75%. No inducible ischemia. Normal echo   stress   Impressions:   - Normal study after maximal exercise  2D Doppler echocardiogram in May 2017: Study Conclusions   - Left ventricle: The cavity size was normal. Wall thickness was   increased in a pattern of mild LVH. Systolic function  was normal.   The estimated ejection fraction was in the range of 55% to 60%.   Wall motion was normal; there were no regional wall motion   abnormalities. Doppler parameters are consistent with abnormal   left ventricular relaxation (grade 1 diastolic dysfunction). - Aortic valve: Mildly calcified annulus. Trileaflet; normal   thickness leaflets. Valve area (VTI): 2.35 cm^2. Valve area   (Vmax): 2.3 cm^2. - Technically adequate study.    ASSESSMENT:    1. Precordial pain   2. Essential hypertension   3. Other hyperlipidemia   4. CRF (chronic renal failure), stage 3 (moderate) (HCC)      PLAN:  In order of problems listed above:  1. Risk factors include age, untreated hyperlipidemia, hypertension, and family history.  Coronary CT angiography will be performed to assess for the burden of atherosclerosis and to help determine if more aggressive risk factor modification, i.e. lipid management is necessary. 2. Target blood pressure 130/85 mmHg or less. 3. Suspect LDL target needs to be less than 70.  CT angios will help [place] resolve the issue.  Clinical follow-up in 1 year unless significant abnormality found on coronary CT.  Will need to start lipid management if significant atherosclerotic burden is noted on CT.    Medication Adjustments/Labs and Tests  Ordered: Current medicines are reviewed at length with the patient today.  Concerns regarding medicines are outlined above.  Medication changes, Labs and Tests ordered today are listed in the Patient Instructions below. Patient Instructions  Medication Instructions:  Your physician recommends that you continue on your current medications as directed. Please refer to the Current Medication list given to you today.  Labwork: None  Testing/Procedures: Your physician recommends that you have a Coronary CT Angio to look at the arteries in your heart.  Follow-Up: Your physician wants you to follow-up in: 1 year with Dr. Tamala Julian.  You will receive a reminder letter in the mail two months in advance. If you don't receive a letter, please call our office to schedule the follow-up appointment.   Any Other Special Instructions Will Be Listed Below (If Applicable).    [facility] [address] [place], [place] [address] [phone]  Proceed to the [facility] Radiology Department (First Floor).  Please follow these instructions carefully (unless otherwise directed):  Hold all erectile dysfunction medications at least 48 hours prior to test.  On the Night Before the Test: . Drink plenty of water. . Do not consume any caffeinated/decaffeinated beverages or chocolate 12 hours prior to your test. . Do not take any antihistamines 12 hours prior to your test. . If you take Metformin do not take 24 hours prior to test.  On the Day of the Test: . Drink plenty of water. Do not drink any water within one hour of the test. . Do not eat any food 4 hours prior to the test. . You may take your regular medications prior to the test. . IF NOT ON A BETA BLOCKER - Take 50 mg of lopressor (metoprolol) one hour before the test. . HOLD Furosemide morning of the test.  After the Test: . Drink plenty of water. . After receiving IV contrast, you may experience a mild flushed feeling. This is  normal. . On occasion, you may experience a mild rash up to 24 hours after the test. This is not dangerous. If this occurs, you can take Benadryl 25 mg and increase your fluid intake. . If you experience trouble breathing, this can  be serious. If it is severe call 911 IMMEDIATELY. If it is mild, please call our office. . If you take any of these medications: Glipizide/Metformin, Avandament, Glucavance, please do not take 48 hours after completing test.   If you need a refill on your cardiac medications before your next appointment, please call your pharmacy.      Signed, Sinclair Grooms, MD  01/04/2018 9:31 AM    [facility] [place], [place], [place]  [address] Phone: [phone]; Fax: [phone]

## 2018-01-04 ENCOUNTER — Encounter: Payer: Self-pay | Admitting: Interventional Cardiology

## 2018-01-04 ENCOUNTER — Ambulatory Visit: Payer: Medicare Other | Admitting: Interventional Cardiology

## 2018-01-04 VITALS — BP 124/70 | HR 70 | Ht 64.0 in | Wt 160.0 lb

## 2018-01-04 DIAGNOSIS — E7849 Other hyperlipidemia: Secondary | ICD-10-CM | POA: Diagnosis not present

## 2018-01-04 DIAGNOSIS — I10 Essential (primary) hypertension: Secondary | ICD-10-CM

## 2018-01-04 DIAGNOSIS — N183 Chronic kidney disease, stage 3 unspecified: Secondary | ICD-10-CM

## 2018-01-04 DIAGNOSIS — R072 Precordial pain: Secondary | ICD-10-CM

## 2018-01-04 MED ORDER — METOPROLOL TARTRATE 50 MG PO TABS: ORAL_TABLET | ORAL | 0 refills | Status: DC

## 2018-01-04 NOTE — Patient Instructions (Addendum)
Medication Instructions:  Your physician recommends that you continue on your current medications as directed. Please refer to the Current Medication list given to you today.  Labwork: None  Testing/Procedures: Your physician recommends that you have a Coronary CT Angio to look at the arteries in your heart.  Follow-Up: Your physician wants you to follow-up in: 1 year with Dr. Tamala Julian.  You will receive a reminder letter in the mail two months in advance. If you don't receive a letter, please call our office to schedule the follow-up appointment.   Any Other Special Instructions Will Be Listed Below (If Applicable).    [facility] [address] [place], [place] [address] [phone]  Proceed to the [facility] Radiology Department (First Floor).  Please follow these instructions carefully (unless otherwise directed):  Hold all erectile dysfunction medications at least 48 hours prior to test.  On the Night Before the Test: . Drink plenty of water. . Do not consume any caffeinated/decaffeinated beverages or chocolate 12 hours prior to your test. . Do not take any antihistamines 12 hours prior to your test. . If you take Metformin do not take 24 hours prior to test.  On the Day of the Test: . Drink plenty of water. Do not drink any water within one hour of the test. . Do not eat any food 4 hours prior to the test. . You may take your regular medications prior to the test. . IF NOT ON A BETA BLOCKER - Take 50 mg of lopressor (metoprolol) one hour before the test. . HOLD Furosemide morning of the test.  After the Test: . Drink plenty of water. . After receiving IV contrast, you may experience a mild flushed feeling. This is normal. . On occasion, you may experience a mild rash up to 24 hours after the test. This is not dangerous. If this occurs, you can take Benadryl 25 mg and increase your fluid intake. . If you experience trouble breathing, this can be serious.  If it is severe call 911 IMMEDIATELY. If it is mild, please call our office. . If you take any of these medications: Glipizide/Metformin, Avandament, Glucavance, please do not take 48 hours after completing test.   If you need a refill on your cardiac medications before your next appointment, please call your pharmacy.

## 2018-01-09 ENCOUNTER — Other Ambulatory Visit: Payer: Self-pay | Admitting: Interventional Cardiology

## 2018-01-09 ENCOUNTER — Other Ambulatory Visit: Payer: Medicare Other | Admitting: *Deleted

## 2018-01-09 DIAGNOSIS — R072 Precordial pain: Secondary | ICD-10-CM

## 2018-01-09 DIAGNOSIS — N183 Chronic kidney disease, stage 3 unspecified: Secondary | ICD-10-CM

## 2018-01-09 LAB — BASIC METABOLIC PANEL
BUN / CREAT RATIO: 15 (ref 12–28)
BUN: 22 mg/dL (ref 8–27)
CALCIUM: 10.1 mg/dL (ref 8.7–10.3)
CHLORIDE: 105 mmol/L (ref 96–106)
CO2: 25 mmol/L (ref 20–29)
CREATININE: 1.51 mg/dL — AB (ref 0.57–1.00)
GFR calc Af Amer: 42 mL/min/{1.73_m2} — ABNORMAL LOW (ref >59)
GFR calc non Af Amer: 36 mL/min/{1.73_m2} — ABNORMAL LOW (ref >59)
GLUCOSE: 84 mg/dL (ref 65–99)
Potassium: 4.5 mmol/L (ref 3.5–5.2)
Sodium: 143 mmol/L (ref 134–144)

## 2018-01-09 MED ORDER — SODIUM CHLORIDE 0.9 % IV SOLN: INTRAVENOUS | Status: AC

## 2018-01-10 ENCOUNTER — Telehealth: Payer: Self-pay | Admitting: Interventional Cardiology

## 2018-01-10 DIAGNOSIS — N183 Chronic kidney disease, stage 3 unspecified: Secondary | ICD-10-CM

## 2018-01-10 NOTE — Telephone Encounter (Signed)
Spoke with pt and went over results and recommendations per Dr. Tamala Julian.  Pt aware to be at [facility] at 7:30a for CT and hydration.  Pt appreciative for call.  See BMET result from 2/6 for further information.

## 2018-01-10 NOTE — Addendum Note (Signed)
Addended by: Loren Racer on: 01/10/2018 12:15 PM   Modules accepted: Orders

## 2018-01-10 NOTE — Telephone Encounter (Signed)
New message    Patient returning call to nurse for instructions. Please call

## 2018-01-11 ENCOUNTER — Other Ambulatory Visit: Payer: Self-pay | Admitting: Interventional Cardiology

## 2018-01-11 DIAGNOSIS — N184 Chronic kidney disease, stage 4 (severe): Secondary | ICD-10-CM

## 2018-01-14 HISTORY — PX: COLONOSCOPY: SHX174

## 2018-01-17 ENCOUNTER — Ambulatory Visit (HOSPITAL_COMMUNITY)
Admission: RE | Admit: 2018-01-17 | Discharge: 2018-01-17 | Disposition: A | Discharge status: Home / Self Care | Payer: Medicare Other | Source: Ambulatory Visit | Attending: Interventional Cardiology | Admitting: Interventional Cardiology

## 2018-01-17 DIAGNOSIS — R072 Precordial pain: Secondary | ICD-10-CM

## 2018-01-17 DIAGNOSIS — N184 Chronic kidney disease, stage 4 (severe): Secondary | ICD-10-CM

## 2018-01-17 DIAGNOSIS — I517 Cardiomegaly: Secondary | ICD-10-CM | POA: Diagnosis not present

## 2018-01-17 LAB — GLUCOSE, CAPILLARY: Glucose-Capillary: 84 mg/dL (ref 65–99)

## 2018-01-17 MED ORDER — NITROGLYCERIN 0.4 MG SL SUBL
0.8000 mg | SUBLINGUAL_TABLET | SUBLINGUAL | Status: DC | PRN
  Administered 2018-01-17: 0.8 mg via SUBLINGUAL
  Filled 2018-01-17: qty 25

## 2018-01-17 MED ORDER — METOPROLOL TARTRATE 5 MG/5ML IV SOLN
INTRAVENOUS | Status: AC
  Administered 2018-01-17: 5 mg via INTRAVENOUS
  Filled 2018-01-17: qty 5

## 2018-01-17 MED ORDER — NITROGLYCERIN 0.4 MG SL SUBL
SUBLINGUAL_TABLET | SUBLINGUAL | Status: AC
  Administered 2018-01-17: 0.8 mg via SUBLINGUAL
  Filled 2018-01-17: qty 1

## 2018-01-17 MED ORDER — IOPAMIDOL (ISOVUE-370) INJECTION 76%
INTRAVENOUS | Status: AC
  Filled 2018-01-17: qty 100

## 2018-01-17 MED ORDER — SODIUM CHLORIDE 0.9 % IV SOLN: INTRAVENOUS | Status: DC

## 2018-01-17 MED ORDER — METOPROLOL TARTRATE 5 MG/5ML IV SOLN
5.0000 mg | Freq: Once | INTRAVENOUS | Status: AC
  Administered 2018-01-17: 5 mg via INTRAVENOUS
  Filled 2018-01-17: qty 5

## 2018-01-17 NOTE — Progress Notes (Signed)
Pt received 2 hours of fluid per radiology then D/C'ed home.

## 2018-01-21 ENCOUNTER — Telehealth: Payer: Self-pay | Admitting: Interventional Cardiology

## 2018-01-21 ENCOUNTER — Other Ambulatory Visit: Payer: Medicare Other | Admitting: *Deleted

## 2018-01-21 DIAGNOSIS — N183 Chronic kidney disease, stage 3 unspecified: Secondary | ICD-10-CM

## 2018-01-21 DIAGNOSIS — I2510 Atherosclerotic heart disease of native coronary artery without angina pectoris: Secondary | ICD-10-CM

## 2018-01-21 NOTE — Telephone Encounter (Signed)
LM with pt to return call; CT results have not been read as of yet.

## 2018-01-21 NOTE — Telephone Encounter (Signed)
New message    Patient returning call for ct results. Please call

## 2018-01-21 NOTE — Telephone Encounter (Signed)
Lpmtcb 2/18 md

## 2018-01-22 LAB — BASIC METABOLIC PANEL
BUN / CREAT RATIO: 14 (ref 12–28)
BUN: 22 mg/dL (ref 8–27)
CHLORIDE: 104 mmol/L (ref 96–106)
CO2: 24 mmol/L (ref 20–29)
Calcium: 10.4 mg/dL — ABNORMAL HIGH (ref 8.7–10.3)
Creatinine, Ser: 1.52 mg/dL — ABNORMAL HIGH (ref 0.57–1.00)
GFR calc Af Amer: 41 mL/min/{1.73_m2} — ABNORMAL LOW (ref >59)
GFR calc non Af Amer: 36 mL/min/{1.73_m2} — ABNORMAL LOW (ref >59)
GLUCOSE: 108 mg/dL — AB (ref 65–99)
Potassium: 4.9 mmol/L (ref 3.5–5.2)
Sodium: 145 mmol/L — ABNORMAL HIGH (ref 134–144)

## 2018-01-23 MED ORDER — ASPIRIN EC 81 MG PO TBEC: 81.0000 mg | DELAYED_RELEASE_TABLET | Freq: Every day | ORAL | 3 refills | Status: DC

## 2018-01-23 MED ORDER — ROSUVASTATIN CALCIUM 10 MG PO TABS: 10.0000 mg | ORAL_TABLET | Freq: Every day | ORAL | 11 refills | Status: DC

## 2018-01-23 NOTE — Telephone Encounter (Signed)
Spoke with pt and went over CT results and recommendations per Dr. Tamala Julian.  Pt verbalized understanding and was in agreement with this plan.  Pt will have labs drawn at [facility].

## 2018-01-24 ENCOUNTER — Other Ambulatory Visit: Payer: Self-pay | Admitting: *Deleted

## 2018-01-24 ENCOUNTER — Ambulatory Visit
Admission: RE | Admit: 2018-01-24 | Discharge: 2018-01-24 | Disposition: A | Discharge status: Home / Self Care | Payer: Medicare Other | Source: Ambulatory Visit | Attending: Family Medicine | Admitting: Family Medicine

## 2018-01-24 DIAGNOSIS — I2510 Atherosclerotic heart disease of native coronary artery without angina pectoris: Secondary | ICD-10-CM

## 2018-01-24 DIAGNOSIS — Z1231 Encounter for screening mammogram for malignant neoplasm of breast: Secondary | ICD-10-CM

## 2018-02-01 ENCOUNTER — Telehealth: Payer: Self-pay

## 2018-02-01 NOTE — Telephone Encounter (Signed)
Pt wants to come in for a T-Dap shot.  Does Dr Meda Coffee need to order this or can she come in for a nurse visit with you.  Please call the patient Monday.

## 2018-02-04 NOTE — Telephone Encounter (Signed)
Called pt, she needs a ppd, not tdap.

## 2018-02-05 ENCOUNTER — Ambulatory Visit (INDEPENDENT_AMBULATORY_CARE_PROVIDER_SITE_OTHER): Payer: Medicare Other

## 2018-02-05 ENCOUNTER — Encounter: Payer: Self-pay | Admitting: Interventional Cardiology

## 2018-02-05 DIAGNOSIS — Z111 Encounter for screening for respiratory tuberculosis: Secondary | ICD-10-CM | POA: Diagnosis not present

## 2018-02-08 ENCOUNTER — Ambulatory Visit: Payer: Medicare Other

## 2018-02-08 LAB — TB SKIN TEST
INDURATION: 0 mm
TB Skin Test: NEGATIVE

## 2018-03-18 ENCOUNTER — Telehealth: Payer: Self-pay | Admitting: Family Medicine

## 2018-03-18 NOTE — Telephone Encounter (Signed)
Left generic message asking patient to return call.

## 2018-03-18 NOTE — Telephone Encounter (Signed)
Patient is requesting an order for a lipid panel be sent to [facility]. Please call her when this order is finished. [phone]

## 2018-03-19 ENCOUNTER — Telehealth: Payer: Self-pay | Admitting: Family Medicine

## 2018-03-19 NOTE — Telephone Encounter (Signed)
Spoke with patient and let her know there is an order in for Lipid panel (Dr.Smith w/ cardiology ordered lipid panel) and she could go have her labs drawn. She wants to make sure Dr.Nelson can see it. I told her she would be able to, we are all part of [facility]. Patient stated she thought only she could see the results because she has Mychart and I told her the doctor has to review the results and would then release them to her mychart account and she verbalized understanding. She said she wanted Dr.Nelson to be able to see the results for her Monday appt and she was going to have the bloodwork drawn Friday. I said they should be back but because it was Friday, I couldn't guarantee it. She verbalized she was tired of taking pills and the medicine made her hurt and she wanted to be able to control it with diet. I told her a lot of statins cause muscle aches and it was something to discuss with Dr.Nelson when she came in for her appt on Monday.

## 2018-03-19 NOTE — Telephone Encounter (Signed)
LVM-returned patients call. Left generic message.

## 2018-03-19 NOTE — Telephone Encounter (Signed)
LVM --advising returning your call --She needs a Lipid Blood Test [employer])

## 2018-03-23 LAB — HEPATIC FUNCTION PANEL
ALBUMIN: 4.4 g/dL (ref 3.6–4.8)
ALK PHOS: 105 IU/L (ref 39–117)
ALT: 35 IU/L — ABNORMAL HIGH (ref 0–32)
AST: 26 IU/L (ref 0–40)
BILIRUBIN TOTAL: 0.8 mg/dL (ref 0.0–1.2)
BILIRUBIN, DIRECT: 0.17 mg/dL (ref 0.00–0.40)
Total Protein: 6.2 g/dL (ref 6.0–8.5)

## 2018-03-25 ENCOUNTER — Encounter: Payer: Self-pay | Admitting: Interventional Cardiology

## 2018-03-25 ENCOUNTER — Encounter: Payer: Self-pay | Admitting: Family Medicine

## 2018-03-25 ENCOUNTER — Other Ambulatory Visit: Payer: Self-pay

## 2018-03-25 ENCOUNTER — Ambulatory Visit (INDEPENDENT_AMBULATORY_CARE_PROVIDER_SITE_OTHER): Payer: Medicare Other | Admitting: Family Medicine

## 2018-03-25 VITALS — BP 120/56 | HR 92 | Temp 98.4°F | Resp 14 | Ht 64.0 in | Wt 150.0 lb

## 2018-03-25 DIAGNOSIS — Z0000 Encounter for general adult medical examination without abnormal findings: Secondary | ICD-10-CM

## 2018-03-25 MED ORDER — PRAVASTATIN SODIUM 10 MG PO TABS: 10.0000 mg | ORAL_TABLET | Freq: Every day | ORAL | 3 refills | Status: DC

## 2018-03-25 NOTE — Progress Notes (Signed)
Subjective:    Samantha Conley is a 67 y.o. female who presents for a Welcome to Medicare exam.   Review of Systems Discussed with patient diet.  She eats a well-balanced diet. Cholesterol.  She gets regular exercise. No problems with bowels or digestion.  She is up-to-date with endoscopy and colonoscopy. She had a hysterectomy no longer needs Pap screening She has never had a DEXA scan.  Was ordered but was not done.  She is reminded that she needs a DEXA scan. She states her immunizations are up-to-date.  I do not have record off of them. She feels well and has no complaints today.  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  She is eager to get busy and spend time with children.      Objective:    Today's Vitals   03/25/18 1356  BP: (!) 120/56  Pulse: 92  Resp: 14  Temp: 98.4 F (36.9 C)  TempSrc: Oral  SpO2: 98%  Weight: 150 lb (68 kg)  Height: 5\' 4"  (1.626 m)  Body mass index is 25.75 kg/m.  Medications Outpatient Encounter Medications as of 03/25/2018  Medication Sig  . acetaminophen (TYLENOL) 500 MG tablet Take 500 mg by mouth every 6 (six) hours as needed for mild pain.  Marland Kitchen allopurinol (ZYLOPRIM) 300 MG tablet Take 1 tablet (300 mg total) by mouth at bedtime.  Marland Kitchen amLODipine (NORVASC) 5 MG tablet Take 1 tablet (5 mg total) by mouth at bedtime.  Marland Kitchen aspirin EC 81 MG tablet Take 1 tablet (81 mg total) by mouth daily.  . citalopram (CELEXA) 10 MG tablet Take 5 mg by mouth daily.  . divalproex (DEPAKOTE ER) 250 MG 24 hr tablet Take 250 mg by mouth 2 (two) times daily.  Marland Kitchen doxepin (SINEQUAN) 50 MG capsule Take 50 mg by mouth at bedtime.  . polyethylene glycol (MIRALAX / GLYCOLAX) packet Take 17 g by mouth daily as needed for mild constipation.  . ranitidine (ZANTAC) 150 MG capsule Take 1 capsule (150 mg total) by mouth every evening.  . metoprolol tartrate (LOPRESSOR) 50 MG tablet Take 1 tablet by mouth one hour prior to Coronary CT (Patient  not taking: Reported on 03/25/2018)  . rosuvastatin (CRESTOR) 10 MG tablet Take 1 tablet (10 mg total) by mouth daily. (Patient not taking: Reported on 03/25/2018)   No facility-administered encounter medications on file as of 03/25/2018.      History: Past Medical History:  Diagnosis Date  . Allergy   . Anemia   . Anxiety   . Arthritis    back  . Bipolar disorder ([place])   . Bipolar disorder, unspecified ([place]) 04/19/2014  . Cataract   . chemical imbalance 12/14/2011  . Chronic kidney disease (CKD), stage III (moderate) (HCC)   . Dehydration 04/25/2014  . Dehydration, severe 03/12/2014  . Depression   . Diarrhea 03/12/2014  . Essential hypertension   . GERD (gastroesophageal reflux disease)   . Gout   . HLD (hyperlipidemia) 04/27/2017  . Hypercalcemia 03/12/2014  . Insomnia   . Lithium toxicity    Past Surgical History:  Procedure Laterality Date  . CATARACT EXTRACTION, BILATERAL     Lens implant bilaterally  . CHOLECYSTECTOMY    . COLONOSCOPY N/A 01/14/2018  . NASAL SINUS SURGERY  2014  . TONSILLECTOMY    . TOTAL ABDOMINAL HYSTERECTOMY     endometriosis, bleeding    Family History  Problem Relation Age of Onset  .  CAD Mother   . CVA Mother   . Heart disease Mother   . Hyperlipidemia Mother   . Hypertension Mother   . Stroke Mother   . CAD Father   . Parkinsonism Father   . Cancer Father        melanoma  . Heart disease Father   . Cancer Brother        prostate  . Cancer Brother        melanoma  . Stroke Maternal Grandmother   . Heart disease Maternal Grandfather   . Cancer Paternal Grandmother        colon  . Heart disease Paternal Grandfather    Social History   Occupational History  . 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 Smoking status: Never Smoker  . Smokeless tobacco: Never Used  Substance and Sexual Activity  . Alcohol use: No    Alcohol/week: 0.0 oz  . Drug use: No  . Sexual activity: Not Currently     Tobacco Counseling Counseling given: Yes Patient is not a smoker  Immunizations and Health Maintenance Immunization History  Administered Date(s) Administered  . Influenza,inj,Quad PF,6+ Mos 09/27/2017  . PPD Test 02/05/2018  . Tdap 04/16/2017   Health Maintenance Due  Topic Date Due  . Hepatitis C Screening  06-Jul-1951-discussed with patient declines  . HIV Screening  10/07/1967-discussed with patient declines  . PAP SMEAR  10/06/1973-not indicated, hysterectomy  . DEXA SCAN  10/06/2017-ordered, not yet performed  . PNA vac Low Risk Adult (1 of 2 - PCV13) 10/06/2017-patient states she has had 2, will get records    Activities of Daily Living In your present state of health, do you have any difficulty performing the following activities: 09/27/2017 04/20/2017  Hearing? N N  Vision? N N  Difficulty concentrating or making decisions? N N  Walking or climbing stairs? N N  Dressing or bathing? N N  Doing errands, shopping? N N  Some recent data might be hidden     Advanced Directives: Does Patient Have a Medical Advance Directive?: Yes Type of Advance Directive: Healthcare Power of Attorney, Living will Does patient want to make changes to medical advance directive?: No - Patient declined Copy of [place] in Chart?: No - copy requested    Assessment:    This is a routine wellness examination for this patient . Vision/Hearing screen-patient reports no difficulty with hearing and reading No exam data present  Dietary issues and exercise activities discussed: -Patient is on a heart healthy diet and exercises regularly    Goals    None     Depression Screen PHQ 2/9 Scores 03/25/2018 09/27/2017 04/20/2017 03/08/2017  PHQ - 2 Score 0 0 0 0     Fall Risk Fall Risk  03/25/2018  Falls in the past year? No  Number falls in past yr: -  Injury with Fall? -    Cognitive Function:     6CIT Screen 03/25/2018  What Year? 0 points  What month? 0  points  What time? 0 points  Count back from 20 0 points  Months in reverse 0 points  Repeat phrase 0 points  Total Score 0    Patient Care Team: Caren Macadam, MD as PCP - General (Family Medicine) Belva Crome, MD as PCP - Cardiology (Cardiology)     Plan:    I have personally reviewed and noted the following in the  patient's chart:   . Medical and social history . Use of alcohol, tobacco or illicit drugs  . Current medications and supplements . Functional ability and status . Nutritional status . Physical activity . Advanced directives . List of other physicians . Hospitalizations, surgeries, and ER visits in previous 12 months-none . Vitals . Screenings to include cognitive, depression, and falls . Referrals and appointments  In addition, I have reviewed and discussed with patient certain preventive protocols, quality metrics, and best practice recommendations. A written personalized care plan for preventive services as well as general preventive health recommendations were provided to patient.     Tod Persia, [place] 03/25/2018 Reviewed above information and added data. Raylene Everts, MD

## 2018-03-25 NOTE — Patient Instructions (Addendum)
take the pravachol once every day at bedtime Start every other day for 2 weeks then once daily  Continue other medicines and treatments  See Dr Mannie Stabile in 2-3 months Will re check cholesterol at that time     Health Maintenance for Postmenopausal Women What should I know about menopause? During menopause, you may experience a number of symptoms, such as:  Moderate-to-severe hot flashes.  Night sweats.  Decrease in sex drive.  Mood swings.  Headaches.  Tiredness.  Irritability.  Memory problems.  Insomnia. Fortunately, you have completed menopause Choosing to treat or not to treat menopausal changes is an individual decision that you make with your health care provider.  What should I know about heart disease and stroke? Heart disease, heart attack, and stroke become more likely as you age. This may be due, in part, to the hormonal changes that your body experiences during menopause. These can affect how your body processes dietary fats, triglycerides, and cholesterol. Heart attack and stroke are both medical emergencies. There are many things that you can do to help prevent heart disease and stroke:  Have your blood pressure checked at least every 1-2 years. High blood pressure causes heart disease and increases the risk of stroke.  If you are 38-58 years old, ask your health care provider if you should take aspirin to prevent a heart attack or a stroke.  Do not use any tobacco products, including cigarettes, chewing tobacco, or electronic cigarettes. If you need help quitting, ask your health care provider.  It is important to eat a healthy diet and maintain a healthy weight. ? Be sure to include plenty of vegetables, fruits, low-fat dairy products, and lean protein. ? Avoid eating foods that are high in solid fats, added sugars, or salt (sodium).  Get regular exercise. This is one of the most important things that you can do for your health. ? Try to exercise for at  least 150 minutes each week. The type of exercise that you do should increase your heart rate and make you sweat. This is known as moderate-intensity exercise. ? Try to do strengthening exercises at least twice each week. Do these in addition to the moderate-intensity exercise.  Know your numbers.Ask your health care provider to check your cholesterol and your blood glucose. Continue to have your blood tested as directed by your health care provider.  What should I know about cancer screening? There are several types of cancer. Take the following steps to reduce your risk and to catch any cancer development as early as possible. Breast Cancer  Practice breast self-awareness. ? This means understanding how your breasts normally appear and feel. ? It also means doing regular breast self-exams. Let your health care provider know about any changes, no matter how small.  If you are 15 or older, have a clinician do a breast exam (clinical breast exam or CBE) every year. Depending on your age, family history, and medical history, it may be recommended that you also have a yearly breast X-ray (mammogram).  If you have a family history of breast cancer, talk with your health care provider about genetic screening.  If you are at high risk for breast cancer, talk with your health care provider about having an MRI and a mammogram every year.  Breast cancer (BRCA) gene test is recommended for women who have family members with BRCA-related cancers. Results of the assessment will determine the need for genetic counseling and BRCA1 and for BRCA2 testing. BRCA-related cancers include  these types: ? Breast. This occurs in males or females. ? Ovarian. ? Tubal. This may also be called fallopian tube cancer. ? Cancer of the abdominal or pelvic lining (peritoneal cancer). ? Prostate. ? Pancreatic.  Cervical, Uterine, and Ovarian Cancer Your health care provider may recommend that you be screened regularly  for cancer of the pelvic organs. These include your ovaries, uterus, and vagina. This screening involves a pelvic exam, which includes checking for microscopic changes to the surface of your cervix (Pap test).  You do not require Pap screening because you have had a hysterectomy  If you have vaginal bleeding after reaching menopause, tell your health care provider.  There are currently no reliable tests available to screen for ovarian cancer.  Lung Cancer  Non-smoker.  No screening indicated  Colorectal Cancer  This type of cancer can be detected and can often be prevented.  Routine colorectal cancer screening usually begins at age 107 and continues through age 72.  If you have risk factors for colon cancer, your health care provider may recommend that you be screened at an earlier age.  If you have a family history of colorectal cancer, talk with your health care provider about genetic screening.  Your health care provider may also recommend using home test kits to check for hidden blood in your stool.  A small camera at the end of a tube can be used to examine your colon directly (sigmoidoscopy or colonoscopy). This is done to check for the earliest forms of colorectal cancer.  Direct examination of the colon should be repeated every 5-10 years until age 23. However, if early forms of precancerous polyps or small growths are found or if you have a family history or genetic risk for colorectal cancer, you may need to be screened more often.  Skin Cancer  Check your skin from head to toe regularly.  Monitor any moles. Be sure to tell your health care provider: ? About any new moles or changes in moles, especially if there is a change in a mole's shape or color. ? If you have a mole that is larger than the size of a pencil eraser.  If any of your family members has a history of skin cancer, especially at a young age, talk with your health care provider about genetic  screening.  Always use sunscreen. Apply sunscreen liberally and repeatedly throughout the day.  Whenever you are outside, protect yourself by wearing long sleeves, pants, a wide-brimmed hat, and sunglasses.  What should I know about osteoporosis? Osteoporosis is a condition in which bone destruction happens more quickly than new bone creation. After menopause, you may be at an increased risk for osteoporosis. To help prevent osteoporosis or the bone fractures that can happen because of osteoporosis, the following is recommended:  If you are 51-50 years old, get at least 1,000 mg of calcium and at least 600 mg of vitamin D per day.  If you are older than age 44 but younger than age 39, get at least 1,200 mg of calcium and at least 600 mg of vitamin D per day.  If you are older than age 72, get at least 1,200 mg of calcium and at least 800 mg of vitamin D per day.  Smoking and excessive alcohol intake increase the risk of osteoporosis. Eat foods that are rich in calcium and vitamin D, and do weight-bearing exercises several times each week as directed by your health care provider. What should I know about how  menopause affects my mental health? Depression may occur at any age, but it is more common as you become older. Common symptoms of depression include:  Low or sad mood.  Changes in sleep patterns.  Changes in appetite or eating patterns.  Feeling an overall lack of motivation or enjoyment of activities that you previously enjoyed.  Frequent crying spells.  Talk with your health care provider if you think that you are experiencing depression. What should I know about immunizations? It is important that you get and maintain your immunizations. These include:  Tetanus, diphtheria, and pertussis (Tdap) booster vaccine.  Influenza every year before the flu season begins.  Pneumonia vaccine.  Shingles vaccine.

## 2018-05-09 ENCOUNTER — Encounter: Payer: Self-pay | Admitting: Family Medicine

## 2018-05-10 ENCOUNTER — Encounter: Payer: Self-pay | Admitting: Family Medicine

## 2018-05-13 ENCOUNTER — Encounter: Payer: Self-pay | Admitting: Interventional Cardiology

## 2018-05-14 ENCOUNTER — Encounter: Payer: Self-pay | Admitting: Interventional Cardiology

## 2018-05-16 ENCOUNTER — Telehealth: Payer: Self-pay | Admitting: Family Medicine

## 2018-05-16 NOTE — Telephone Encounter (Signed)
Patient Samantha Conley came in, asking to establish care with Dr. Birdie Riddle. Her PCP has left the office she is currently established at and she was recommended to Dr. Birdie Riddle by her cardiologist, Dr. Daneen Schick.  Pt has an appointment with Dr. Mannie Stabile in July, but Dr. Mannie Stabile will be leaving the office in September. Please advise.

## 2018-05-19 NOTE — Telephone Encounter (Signed)
I am unable to accept at this time.  Dr Jonni Sanger or Einar Pheasant would be excellent options!

## 2018-05-20 ENCOUNTER — Encounter: Payer: Self-pay | Admitting: Interventional Cardiology

## 2018-05-20 NOTE — Telephone Encounter (Signed)
Pt states that she will discuss this with Dr. Tamala Julian before scheduling with Jonni Sanger or [place] and then will call back

## 2018-05-21 ENCOUNTER — Telehealth: Payer: Self-pay | Admitting: Emergency Medicine

## 2018-05-21 NOTE — Telephone Encounter (Signed)
Copied from [place] [id number]. Topic: Appointment Scheduling - New Patient >> May 21, 2018  8:26 AM Margot Ables wrote: New patient has been scheduled for your office. Provider: Dr. Jonni Sanger Date of Appointment: 06/27/18 10:30am  Route to department's PEC pool.

## 2018-05-28 ENCOUNTER — Ambulatory Visit: Payer: Medicare Other | Admitting: Family Medicine

## 2018-05-29 ENCOUNTER — Encounter: Payer: Self-pay | Admitting: Interventional Cardiology

## 2018-05-29 LAB — LIPID PANEL
CHOL/HDL RATIO: 2.7 ratio (ref 0.0–4.4)
CHOLESTEROL TOTAL: 150 mg/dL (ref 100–199)
HDL: 55 mg/dL (ref >39)
LDL Calculated: 77 mg/dL (ref 0–99)
TRIGLYCERIDES: 90 mg/dL (ref 0–149)
VLDL Cholesterol Cal: 18 mg/dL (ref 5–40)

## 2018-06-25 ENCOUNTER — Ambulatory Visit: Payer: Medicare Other | Admitting: Family Medicine

## 2018-06-27 ENCOUNTER — Other Ambulatory Visit: Payer: Self-pay

## 2018-06-27 ENCOUNTER — Ambulatory Visit: Payer: Medicare Other | Admitting: Family Medicine

## 2018-06-27 ENCOUNTER — Encounter: Payer: Self-pay | Admitting: Family Medicine

## 2018-06-27 VITALS — BP 112/72 | HR 70 | Temp 97.4°F | Ht 64.25 in | Wt 142.4 lb

## 2018-06-27 DIAGNOSIS — F319 Bipolar disorder, unspecified: Secondary | ICD-10-CM | POA: Diagnosis not present

## 2018-06-27 DIAGNOSIS — N183 Chronic kidney disease, stage 3 unspecified: Secondary | ICD-10-CM

## 2018-06-27 DIAGNOSIS — I10 Essential (primary) hypertension: Secondary | ICD-10-CM

## 2018-06-27 DIAGNOSIS — E782 Mixed hyperlipidemia: Secondary | ICD-10-CM | POA: Diagnosis not present

## 2018-06-27 DIAGNOSIS — K219 Gastro-esophageal reflux disease without esophagitis: Secondary | ICD-10-CM

## 2018-06-27 MED ORDER — PNEUMOCOCCAL 13-VAL CONJ VACC IM SUSP: 0.5000 mL | Freq: Once | INTRAMUSCULAR | 0 refills | Status: AC

## 2018-06-27 MED ORDER — ZOSTER VAC RECOMB ADJUVANTED 50 MCG/0.5ML IM SUSR: 0.5000 mL | Freq: Once | INTRAMUSCULAR | 0 refills | Status: AC

## 2018-06-27 MED ORDER — PRAVASTATIN SODIUM 10 MG PO TABS: 10.0000 mg | ORAL_TABLET | Freq: Every day | ORAL | 3 refills | Status: DC

## 2018-06-27 NOTE — Progress Notes (Signed)
Subjective  CC:  Chief Complaint  Patient presents with  . Establish Care    Transfer from Dr. Meda Coffee, no complaints     HPI: Samantha Conley is a 67 y.o. female who presents to [place] at [facility] today to establish care with me as a new patient.   She has the following concerns or needs:  Very pleasant 67 yo female: I have reviewed records and labs.   Has problems documented in PL: HTN is controlled. Started on pravachol and recent ldl 76; recent negative cardiac CT angio. Feels well. CKD managed by renal.   Psych meds managed by psych; stable by report  HM: AWV and mammo up to date. Due dexa. Declines ca or vit D supplements. Eats healthy.   Assessment  1. Essential hypertension   2. CRF (chronic renal failure), stage 3 (moderate) (HCC)   3. Mixed hyperlipidemia   4. Bipolar disease, chronic ([place])   5. Laryngopharyngeal reflux (LPR)      Plan   Chronic problems are well controlled.   HM: prevnar and shingrix due: RX's and instructions given. AWV up to date; April 2019.  Follow up:  Return in about 6 months (around 12/28/2018) for follow up Hypertension, complete physical. No orders of the defined types were placed in this encounter.  Meds ordered this encounter  Medications  . pneumococcal 13-valent conjugate vaccine (PREVNAR 13) SUSP injection    Sig: Inject 0.5 mLs into the muscle once for 1 dose.    Dispense:  0.5 mL    Refill:  0  . Zoster Vaccine Adjuvanted [facility]) injection    Sig: Inject 0.5 mLs into the muscle once for 1 dose. Please give 2nd dose 2-6 months after first dose    Dispense:  2 each    Refill:  0  . pravastatin (PRAVACHOL) 10 MG tablet    Sig: Take 1 tablet (10 mg total) by mouth daily.    Dispense:  90 tablet    Refill:  3     Depression screen [facility] 2/9 06/27/2018 03/25/2018 09/27/2017 04/20/2017 03/08/2017  Decreased Interest 0 0 0 0 0  Down, Depressed, Hopeless 0 0 0 0 -  PHQ - 2 Score 0 0 0 0 0  Altered  sleeping 0 - - - -  Tired, decreased energy 0 - - - -  Change in appetite 0 - - - -  Feeling bad or failure about yourself  0 - - - -  Trouble concentrating 0 - - - -  Moving slowly or fidgety/restless 0 - - - -  Suicidal thoughts 0 - - - -  PHQ-9 Score 0 - - - -  Difficult doing work/chores Not difficult at all - - - -    We updated and reviewed the patient's past history in detail and it is documented below.  Patient Active Problem List   Diagnosis Date Noted  . Vitamin D deficiency 04/27/2017  . Mixed hyperlipidemia 04/27/2017  . CRF (chronic renal failure), stage 3 (moderate) ([place]) 04/20/2017  . 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 Bipolar disease, chronic ([place]) 03/14/2014  . Essential hypertension 12/14/2011    Dr. Daneen Schick, cardiology; 2019 cardiac CT angio: negative study 2017: neg stress ECHO   . Gout 12/14/2011   Health Maintenance  Topic Date Due  . PNA vac Low Risk Adult (2 of 2 - PPSV23) 10/06/2017  . DEXA SCAN  02/02/2019 (Originally 10/06/2017)  . INFLUENZA VACCINE  07/04/2018  .  MAMMOGRAM  01/24/2019  . TETANUS/TDAP  04/17/2027  . COLONOSCOPY  01/15/2028  . Hepatitis C Screening  Completed  . HIV Screening  Completed  . PAP SMEAR  Discontinued   Immunization History  Administered Date(s) Administered  . Influenza,inj,Quad PF,6+ Mos 09/27/2017  . PPD Test 02/05/2018  . Tdap 04/16/2017   Current Meds  Medication Sig  . acetaminophen (TYLENOL) 500 MG tablet Take 500 mg by mouth every 6 (six) hours as needed for mild pain.  Marland Kitchen allopurinol (ZYLOPRIM) 300 MG tablet Take 1 tablet (300 mg total) by mouth at bedtime.  Marland Kitchen amLODipine (NORVASC) 5 MG tablet Take 1 tablet (5 mg total) by mouth at bedtime.  . citalopram (CELEXA) 10 MG tablet Take 5 mg by mouth daily.  . divalproex (DEPAKOTE ER) 250 MG 24 hr tablet Take 250 mg by mouth 2 (two) times daily.  Marland Kitchen doxepin (SINEQUAN) 50 MG capsule Take 50 mg by mouth at bedtime.  . polyethylene glycol (MIRALAX /  GLYCOLAX) packet Take 17 g by mouth daily as needed for mild constipation.  . pravastatin (PRAVACHOL) 10 MG tablet Take 1 tablet (10 mg total) by mouth daily.  . raNITIdine HCl (RANITIDINE 150 MAX STRENGTH PO) Take by mouth.  . [DISCONTINUED] aspirin EC 81 MG tablet Take 1 tablet (81 mg total) by mouth daily.  . [DISCONTINUED] pravastatin (PRAVACHOL) 10 MG tablet Take 1 tablet (10 mg total) by mouth daily.  . [DISCONTINUED] ranitidine (ZANTAC) 150 MG capsule Take 1 capsule (150 mg total) by mouth every evening.    Allergies: Patient is allergic to lidocaine; benadryl [diphenhydramine hcl]; codeine; cortisone; doxycycline; morphine and related; promethazine; and rosuvastatin. Past Medical History Patient  has a past medical history of Allergy, Anemia, Anxiety, Arthritis, Bipolar disorder ([place]), Bipolar disorder, unspecified ([place]) (04/19/2014), Cataract, chemical imbalance (12/14/2011), Chronic kidney disease (CKD), stage III (moderate) ([place]), Dehydration (04/25/2014), Dehydration, severe (03/12/2014), Depression, Diarrhea (03/12/2014), Essential hypertension, GERD (gastroesophageal reflux disease), Gout, HLD (hyperlipidemia) (04/27/2017), Hypercalcemia (03/12/2014), Insomnia, and Lithium toxicity. Past Surgical History Patient  has a past surgical history that includes Cholecystectomy; Tonsillectomy; Nasal sinus surgery (2014); Cataract extraction, bilateral; Total abdominal hysterectomy; and Colonoscopy (N/A, 01/14/2018). Family History: Patient family history includes CAD in her father and mother; CVA in her mother; Cancer in her brother, brother, father, and paternal grandmother; Heart disease in her father, maternal grandfather, mother, and paternal grandfather; Hyperlipidemia in her mother; Hypertension in her mother; Parkinsonism in her father; Stroke in her maternal grandmother and mother. Social History:  Patient  reports that she has never smoked. She has never used smokeless tobacco. She reports that  she does not drink alcohol or use drugs.  Review of Systems: Constitutional: negative for fever or malaise Ophthalmic: negative for photophobia, double vision or loss of vision Cardiovascular: negative for chest pain, dyspnea on exertion, or new LE swelling Respiratory: negative for SOB or persistent cough Gastrointestinal: negative for abdominal pain, change in bowel habits or melena Genitourinary: negative for dysuria or gross hematuria Musculoskeletal: negative for new gait disturbance or muscular weakness Integumentary: negative for new or persistent rashes Neurological: negative for TIA or stroke symptoms Psychiatric: negative for SI or delusions Allergic/Immunologic: negative for hives  Patient Care Team    Relationship Specialty Notifications Start End  Leamon Arnt, MD PCP - General Family Medicine  06/27/18   Belva Crome, MD PCP - Cardiology Cardiology Admissions 01/04/18   Janeth Rase, NP Nurse Practitioner Adult Health Nurse Practitioner  06/27/18   Madelon Lips, MD Consulting  Physician Nephrology  06/27/18   Karma Greaser, AUD  Audiology  06/27/18     Objective  Vitals: BP 112/72   Pulse 70   Temp (!) 97.4 F (36.3 C)   Ht 5' 4.25" (1.632 m)   Wt 142 lb 6.4 oz (64.6 kg)   SpO2 98%   BMI 24.25 kg/m  General:  Well developed, well nourished, no acute distress  Psych:  Alert and oriented,normal mood and affect HEENT:  Normocephalic, atraumatic, non-icteric sclera, PERRL, Cardiovascular:  RRR without gallop, rub or murmur Respiratory:  Good breath sounds bilaterally, CTAB with normal respiratory effort Skin:  Warm, no rashes or suspicious lesions noted Neurologic:    Mental status is normal. Gross motor and sensory exams are normal. Normal gait   Commons side effects, risks, benefits, and alternatives for medications and treatment plan prescribed today were discussed, and the patient expressed understanding of the given instructions. Patient is instructed to  call or message via MyChart if he/she has any questions or concerns regarding our treatment plan. No barriers to understanding were identified. We discussed Red Flag symptoms and signs in detail. Patient expressed understanding regarding what to do in case of urgent or emergency type symptoms.   Medication list was reconciled, printed and provided to the patient in AVS. Patient instructions and summary information was reviewed with the patient as documented in the AVS. This note was prepared with assistance of Dragon voice recognition software. Occasional wrong-word or sound-a-like substitutions may have occurred due to the inherent limitations of voice recognition software

## 2018-06-27 NOTE — Patient Instructions (Addendum)
Please return in 6 months for your annual complete physical; please come fasting.  Please go and get your Prevnar (pneumonia) and Shingrix (shingles) vaccinations from a pharmacy.  It was a pleasure meeting you today! Thank you for choosing [place] to meet your healthcare needs! I truly look forward to working with you. If you have any questions or concerns, please send me a message via Mychart or call the office at [phone].  I'm glad you are doing so well.

## 2018-06-28 ENCOUNTER — Encounter: Payer: Self-pay | Admitting: Interventional Cardiology

## 2018-07-05 ENCOUNTER — Encounter: Payer: Self-pay | Admitting: Family Medicine

## 2018-07-05 NOTE — Telephone Encounter (Signed)
FYI

## 2018-07-19 ENCOUNTER — Other Ambulatory Visit: Payer: Self-pay | Admitting: *Deleted

## 2018-07-19 DIAGNOSIS — E782 Mixed hyperlipidemia: Secondary | ICD-10-CM

## 2018-08-20 ENCOUNTER — Other Ambulatory Visit: Payer: Self-pay | Admitting: Interventional Cardiology

## 2018-08-21 LAB — LIPID PANEL
CHOLESTEROL TOTAL: 189 mg/dL (ref 100–199)
Chol/HDL Ratio: 3.8 ratio (ref 0.0–4.4)
HDL: 50 mg/dL (ref >39)
LDL Calculated: 106 mg/dL — ABNORMAL HIGH (ref 0–99)
TRIGLYCERIDES: 163 mg/dL — AB (ref 0–149)
VLDL CHOLESTEROL CAL: 33 mg/dL (ref 5–40)

## 2018-09-06 ENCOUNTER — Encounter: Payer: Self-pay | Admitting: Family Medicine

## 2018-09-17 LAB — BASIC METABOLIC PANEL
BUN: 21 (ref 4–21)
Creatinine: 1.4 — AB (ref <=1.1)
Glucose: 77
POTASSIUM: 4.3 (ref 3.4–5.3)
SODIUM: 144 (ref 137–147)

## 2018-09-25 ENCOUNTER — Encounter: Payer: Self-pay | Admitting: Emergency Medicine

## 2018-09-25 LAB — PTH, INTACT: PTH, Intact: 84

## 2018-11-18 ENCOUNTER — Encounter: Payer: Self-pay | Admitting: Family Medicine

## 2018-11-18 DIAGNOSIS — E2839 Other primary ovarian failure: Secondary | ICD-10-CM

## 2018-11-18 DIAGNOSIS — Z780 Asymptomatic menopausal state: Secondary | ICD-10-CM

## 2018-11-18 NOTE — Telephone Encounter (Signed)
OK for Referral?

## 2018-11-19 ENCOUNTER — Other Ambulatory Visit: Payer: Self-pay | Admitting: Family Medicine

## 2018-11-19 DIAGNOSIS — Z1231 Encounter for screening mammogram for malignant neoplasm of breast: Secondary | ICD-10-CM

## 2018-12-05 ENCOUNTER — Encounter: Payer: Self-pay | Admitting: Family Medicine

## 2018-12-05 ENCOUNTER — Ambulatory Visit: Payer: Medicare Other | Admitting: Family Medicine

## 2018-12-05 ENCOUNTER — Other Ambulatory Visit: Payer: Self-pay

## 2018-12-05 VITALS — BP 116/58 | HR 66 | Temp 98.1°F | Resp 16 | Ht 64.25 in | Wt 139.2 lb

## 2018-12-05 DIAGNOSIS — M545 Low back pain, unspecified: Secondary | ICD-10-CM

## 2018-12-05 DIAGNOSIS — N183 Chronic kidney disease, stage 3 unspecified: Secondary | ICD-10-CM

## 2018-12-05 DIAGNOSIS — F319 Bipolar disorder, unspecified: Secondary | ICD-10-CM | POA: Diagnosis not present

## 2018-12-05 DIAGNOSIS — G8929 Other chronic pain: Secondary | ICD-10-CM

## 2018-12-05 DIAGNOSIS — I10 Essential (primary) hypertension: Secondary | ICD-10-CM

## 2018-12-05 DIAGNOSIS — N184 Chronic kidney disease, stage 4 (severe): Secondary | ICD-10-CM | POA: Insufficient documentation

## 2018-12-05 NOTE — Progress Notes (Signed)
Subjective  CC:  Chief Complaint  Patient presents with  . Back Pain    Lower back and right shoulder.. She is taking Tylenol for pain    HPI: Samantha Conley is a 68 y.o. female who presents to the office today to address the problems listed above in the chief complaint.  Patient complains of chronic history of low back pain.  Over the last 3 to 6 months having more symptoms.  Worse if standing for most of the day or working out in the yard.  She complains of bilateral low back pain and right thoracic pain.  She also has chronic right shoulder pain status post MRI in the past with orthopedics.  Unable to tolerate steroid injections.  Nonsurgical.  She takes Tylenol without relief.  Cannot take NSAIDs due to medications.  No radicular pain.  Pain is mild.  Does not interfere with sleep.  No recent injuries  History of acute renal failure due to lithium toxicity now with mild chronic renal failure.  Requesting that I take over her renal care.  I reviewed her most recent renal notes.  Hypertension has been well controlled.  Recently decreased medication, stopped amlodipine.  Feeling well.  No hypotensive symptoms.  Bipolar disease is managed by psychiatry and is stable  Health maintenance: Patient did have immunizations updated at [facility].  Shingrix and Prevnar are up-to-date.  Had flu shot as well. Assessment  1. Chronic bilateral low back pain without sciatica   2. CRF (chronic renal failure), stage 3 (moderate) (HCC)   3. Essential hypertension   4. Bipolar disease, chronic ([place]) Chronic     Plan   Chronic bilateral lumbago: Reassured.  Tylenol as needed.  Start stretching exercises.  Offered physical therapy but patient declines.  No red flag symptoms identified today.  Has history of DJD, lumbar.  Chronic renal failure: Will manage here for now.  Will send back to renal if progresses.  Continue to avoid nephrotoxins  Hypertension: Well-controlled.  Health maintenance: Due  for physical in April with annual wellness visit and fasting lab work at that time.  Follow up: Return in about 4 months (around 04/05/2019) for complete physical, AWV.  Visit date not found  No orders of the defined types were placed in this encounter.  No orders of the defined types were placed in this encounter.     I reviewed the patients updated PMH, FH, and SocHx.    Patient Active Problem List   Diagnosis Date Noted  . CKD (chronic kidney disease) stage 4, GFR 15-29 ml/min (HCC) 12/05/2018  . Vitamin D deficiency 04/27/2017  . Mixed hyperlipidemia 04/27/2017  . CRF (chronic renal failure), stage 3 (moderate) ([place]) 04/20/2017  . 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 Bipolar disease, chronic ([place]) 03/14/2014  . Essential hypertension 12/14/2011  . Gout 12/14/2011   Current Meds  Medication Sig  . acetaminophen (TYLENOL) 500 MG tablet Take 500 mg by mouth every 6 (six) hours as needed for mild pain.  Marland Kitchen allopurinol (ZYLOPRIM) 300 MG tablet Take 1 tablet (300 mg total) by mouth at bedtime.  . citalopram (CELEXA) 10 MG tablet Take 5 mg by mouth daily.  . divalproex (DEPAKOTE ER) 250 MG 24 hr tablet Take 250 mg by mouth 2 (two) times daily.  Marland Kitchen doxepin (SINEQUAN) 50 MG capsule Take 50 mg by mouth at bedtime.  . polyethylene glycol (MIRALAX / GLYCOLAX) packet Take 17 g by mouth daily as needed for mild constipation.  . pravastatin (  PRAVACHOL) 10 MG tablet Take 1 tablet (10 mg total) by mouth daily.    Allergies: Patient is allergic to lidocaine; benadryl [diphenhydramine hcl]; codeine; cortisone; doxycycline; morphine and related; promethazine; and rosuvastatin. Family History: Patient family history includes CAD in her father and mother; CVA in her mother; Cancer in her brother, brother, father, and paternal grandmother; Heart disease in her father, maternal grandfather, mother, and paternal grandfather; Hyperlipidemia in her mother; Hypertension in her mother; Parkinsonism  in her father; Stroke in her maternal grandmother and mother. Social History:  Patient  reports that she has never smoked. She has never used smokeless tobacco. She reports that she does not drink alcohol or use drugs.  Review of Systems: Constitutional: Negative for fever malaise or anorexia Cardiovascular: negative for chest pain Respiratory: negative for SOB or persistent cough Gastrointestinal: negative for abdominal pain  Objective  Vitals: BP (!) 116/58   Pulse 66   Temp 98.1 F (36.7 C) (Oral)   Resp 16   Ht 5' 4.25" (1.632 m)   Wt 139 lb 3.2 oz (63.1 kg)   SpO2 98%   BMI 23.71 kg/m  General: no acute distress , A&Ox3 HEENT: PEERL, conjunctiva normal, Oropharynx moist,neck is supple Back: Normal range of motion, nontender, negative straight leg raise bilaterally  skin:  Warm, no rashes     Commons side effects, risks, benefits, and alternatives for medications and treatment plan prescribed today were discussed, and the patient expressed understanding of the given instructions. Patient is instructed to call or message via MyChart if he/she has any questions or concerns regarding our treatment plan. No barriers to understanding were identified. We discussed Red Flag symptoms and signs in detail. Patient expressed understanding regarding what to do in case of urgent or emergency type symptoms.   Medication list was reconciled, printed and provided to the patient in AVS. Patient instructions and summary information was reviewed with the patient as documented in the AVS. This note was prepared with assistance of Dragon voice recognition software. Occasional wrong-word or sound-a-like substitutions may have occurred due to the inherent limitations of voice recognition software

## 2018-12-05 NOTE — Patient Instructions (Addendum)
Please return in April 2020 for your annual complete physical; please come fasting. Also please schedule your annual wellness visit for April.    If you have any questions or concerns, please don't hesitate to send me a message via MyChart or call the office at [phone]. Thank you for visiting with [place] today! It's our pleasure caring for you.   Exercise to Lose Weight Exercise and a healthy diet may help you lose weight. Your doctor may suggest specific exercises. EXERCISE IDEAS AND TIPS  Choose low-cost things you enjoy doing, such as walking, bicycling, or exercising to workout videos.  Take stairs instead of the elevator.  Walk during your lunch break.  Park your car further away from work or school.  Go to a gym or an exercise class.  Start with 5 to 10 minutes of exercise each day. Build up to 30 minutes of exercise 4 to 6 days a week.  Wear shoes with good support and comfortable clothes.  Stretch before and after working out.  Work out until you breathe harder and your heart beats faster.  Drink extra water when you exercise.  Do not do so much that you hurt yourself, feel dizzy, or get very short of breath. Exercises that burn about 150 calories:  Running 1  miles in 15 minutes.  Playing volleyball for 45 to 60 minutes.  Washing and waxing a car for 45 to 60 minutes.  Playing touch football for 45 minutes.  Walking 1  miles in 35 minutes.  Pushing a stroller 1  miles in 30 minutes.  Playing basketball for 30 minutes.  Raking leaves for 30 minutes.  Bicycling 5 miles in 30 minutes.  Walking 2 miles in 30 minutes.  Dancing for 30 minutes.  Shoveling snow for 15 minutes.  Swimming laps for 20 minutes.  Walking up stairs for 15 minutes.  Bicycling 4 miles in 15 minutes.  Gardening for 30 to 45 minutes.  Jumping rope for 15 minutes.  Washing windows or floors for 45 to 60 minutes.

## 2018-12-13 ENCOUNTER — Encounter: Payer: Self-pay | Admitting: Family Medicine

## 2018-12-20 ENCOUNTER — Other Ambulatory Visit: Payer: Self-pay | Admitting: *Deleted

## 2018-12-20 ENCOUNTER — Encounter: Payer: Self-pay | Admitting: Family Medicine

## 2018-12-20 MED ORDER — ALLOPURINOL 300 MG PO TABS: 300.0000 mg | ORAL_TABLET | Freq: Every day | ORAL | 3 refills | Status: DC

## 2018-12-30 ENCOUNTER — Other Ambulatory Visit: Payer: Self-pay

## 2018-12-30 ENCOUNTER — Ambulatory Visit (INDEPENDENT_AMBULATORY_CARE_PROVIDER_SITE_OTHER): Payer: Medicare Other | Admitting: Family Medicine

## 2018-12-30 ENCOUNTER — Encounter: Payer: Self-pay | Admitting: Family Medicine

## 2018-12-30 VITALS — BP 126/74 | HR 81 | Temp 97.7°F | Resp 16 | Ht 64.0 in | Wt 138.8 lb

## 2018-12-30 DIAGNOSIS — N183 Chronic kidney disease, stage 3 unspecified: Secondary | ICD-10-CM

## 2018-12-30 DIAGNOSIS — E559 Vitamin D deficiency, unspecified: Secondary | ICD-10-CM | POA: Diagnosis not present

## 2018-12-30 DIAGNOSIS — M1000 Idiopathic gout, unspecified site: Secondary | ICD-10-CM

## 2018-12-30 DIAGNOSIS — I10 Essential (primary) hypertension: Secondary | ICD-10-CM | POA: Diagnosis not present

## 2018-12-30 DIAGNOSIS — Z0000 Encounter for general adult medical examination without abnormal findings: Secondary | ICD-10-CM

## 2018-12-30 DIAGNOSIS — E782 Mixed hyperlipidemia: Secondary | ICD-10-CM | POA: Diagnosis not present

## 2018-12-30 LAB — CBC WITH DIFFERENTIAL/PLATELET
BASOS PCT: 0.7 % (ref 0.0–3.0)
Basophils Absolute: 0 10*3/uL (ref 0.0–0.1)
EOS ABS: 0.1 10*3/uL (ref 0.0–0.7)
EOS PCT: 3.4 % (ref 0.0–5.0)
HCT: 41.3 % (ref 36.0–46.0)
HEMOGLOBIN: 13.8 g/dL (ref 12.0–15.0)
LYMPHS ABS: 2.3 10*3/uL (ref 0.7–4.0)
Lymphocytes Relative: 56.7 % — ABNORMAL HIGH (ref 12.0–46.0)
MCHC: 33.5 g/dL (ref 30.0–36.0)
MCV: 92.2 fl (ref 78.0–100.0)
MONO ABS: 0.2 10*3/uL (ref 0.1–1.0)
Monocytes Relative: 5.3 % (ref 3.0–12.0)
NEUTROS ABS: 1.4 10*3/uL (ref 1.4–7.7)
NEUTROS PCT: 33.9 % — AB (ref 43.0–77.0)
PLATELETS: 179 10*3/uL (ref 150.0–400.0)
RBC: 4.47 Mil/uL (ref 3.87–5.11)
RDW: 12.6 % (ref 11.5–15.5)
WBC: 4 10*3/uL (ref 4.0–10.5)

## 2018-12-30 LAB — COMPREHENSIVE METABOLIC PANEL
ALK PHOS: 103 U/L (ref 39–117)
ALT: 58 U/L — ABNORMAL HIGH (ref 0–35)
AST: 35 U/L (ref 0–37)
Albumin: 4.6 g/dL (ref 3.5–5.2)
BILIRUBIN TOTAL: 1.2 mg/dL (ref 0.2–1.2)
BUN: 24 mg/dL — AB (ref 6–23)
CHLORIDE: 103 meq/L (ref 96–112)
CO2: 28 mEq/L (ref 19–32)
CREATININE: 1.44 mg/dL — AB (ref 0.40–1.20)
Calcium: 10.1 mg/dL (ref 8.4–10.5)
GFR: 36.39 mL/min — ABNORMAL LOW (ref >60.00)
Glucose, Bld: 82 mg/dL (ref 70–99)
Potassium: 3.8 mEq/L (ref 3.5–5.1)
SODIUM: 140 meq/L (ref 135–145)
Total Protein: 6.6 g/dL (ref 6.0–8.3)

## 2018-12-30 LAB — POCT URINALYSIS DIPSTICK
BILIRUBIN UA: NEGATIVE
Blood, UA: NEGATIVE
Glucose, UA: NEGATIVE
KETONES UA: NEGATIVE
Leukocytes, UA: NEGATIVE
Nitrite, UA: NEGATIVE
PH UA: 6.5 (ref 5.0–8.0)
Protein, UA: NEGATIVE
Spec Grav, UA: 1.01 (ref 1.010–1.025)
UROBILINOGEN UA: 0.2 U/dL

## 2018-12-30 LAB — LIPID PANEL
CHOLESTEROL: 164 mg/dL (ref 0–200)
HDL: 51.9 mg/dL (ref >39.00)
LDL CALC: 89 mg/dL (ref 0–99)
NonHDL: 112.42
Total CHOL/HDL Ratio: 3
Triglycerides: 116 mg/dL (ref 0.0–149.0)
VLDL: 23.2 mg/dL (ref 0.0–40.0)

## 2018-12-30 LAB — URIC ACID: URIC ACID, SERUM: 2.8 mg/dL (ref 2.4–7.0)

## 2018-12-30 LAB — TSH: TSH: 1.87 u[IU]/mL (ref 0.35–4.50)

## 2018-12-30 LAB — VITAMIN D 25 HYDROXY (VIT D DEFICIENCY, FRACTURES): VITD: 31.31 ng/mL (ref 30.00–100.00)

## 2018-12-30 NOTE — Addendum Note (Signed)
Addended by: Doran Clay A on: 12/30/2018 08:54 AM   Modules accepted: Orders

## 2018-12-30 NOTE — Patient Instructions (Signed)
Please return in 6 months for follow up of your hypertension. You may schedule your medicare Annual Wellness in April 2020 with [name].  Medicare recommends an Annual Wellness Visit for all patients. Please schedule this to be done with our Nurse Educator, [name]. This is an informative "talk" visit; it's goals are to ensure that your health care needs are being met and to give you education regarding avoiding falls, ensuring you are not suffering from depression or problems with memory or thinking, and to educate you on Advance Care Planning. It helps me take good care of you!  I will release your lab results to you on your MyChart account with further instructions. Please reply with any questions.    If you have any questions or concerns, please don't hesitate to send me a message via MyChart or call the office at [phone]. Thank you for visiting with Samantha Conley today! It's our pleasure caring for you.  Recommendations for women to keep healthy:   EXERCISE AND DIET: We recommended that you start or continue a regular exercise program for good health. Regular exercise means any activity that makes your heart beat faster and makes you sweat. We recommend exercising at least 30 minutes per day at least 3 days a week, preferably 4 or 5. We also recommend a diet low in fat and sugar. Inactivity, poor dietary choices and obesity can cause diabetes, heart attack, stroke, and kidney damage, among others.   ALCOHOL AND SMOKING: Women should limit their alcohol intake to no more than 7 drinks/beers/glasses of wine (combined, not each!) per week. Moderation of alcohol intake to this level decreases your risk of breast cancer and liver damage. And of course, no recreational drugs are part of a healthy lifestyle. And absolutely no smoking or even second hand smoke. Most people know smoking can cause heart and lung diseases, but did you know it also contributes to weakening of your bones? Aging of your skin? Yellowing of  your teeth and nails?  CALCIUM AND VITAMIN D: Adequate intake of calcium and Vitamin D are recommended. The recommendations for exact amounts of these supplements seem to change often, but generally speaking 600 mg of calcium (either carbonate or citrate) and 800 units of Vitamin D per day seems prudent. Certain women may benefit from higher intake of Vitamin D. If you are among these women, your doctor will have told you during your visit.   PAP SMEARS: Pap smears, to check for cervical cancer or precancers, have traditionally been done yearly, although recent scientific advances have shown that most women can have pap smears less often. However, every woman still should have a physical exam from her gynecologist every year. It will include a breast check, inspection of the vulva and vagina to check for abnormal growths or skin changes, a visual exam of the cervix, and then an exam to evaluate the size and shape of the uterus and ovaries. And after 67 years of age, a rectal exam is indicated to check for rectal cancers. We will also provide age appropriate advice regarding health maintenance, like when you should have certain vaccines, screening for sexually transmitted diseases, bone density testing, colonoscopy, mammograms, etc.   MAMMOGRAMS: All women over 70 years old should have a yearly mammogram. Many facilities now offer a "3D" mammogram, which may cost around $50 extra out of pocket. If possible, we recommend you accept the option to have the 3D mammogram performed. It both reduces the number of women who will be called  back for extra views which then turn out to be normal, and it is better than the routine mammogram at detecting truly abnormal areas.   COLONOSCOPY: Colonoscopy to screen for colon cancer is recommended for all women at age 25. We know, you hate the idea of the prep. We agree, BUT, having colon cancer and not knowing it is worse!! Colon cancer so often starts as a polyp that can be  seen and removed at colonscopy, which can quite literally save your life! And if your first colonoscopy is normal and you have no family history of colon cancer, most women don't have to have it again for 10 years. Once every ten years, you can do something that may end up saving your life, right? We will be happy to help you get it scheduled when you are ready. Be sure to check your insurance coverage so you understand how much it will cost. It may be covered as a preventative service at no cost, but you should check your particular policy.

## 2018-12-30 NOTE — Progress Notes (Signed)
Subjective  Chief Complaint  Patient presents with  . Annual Exam    Fasting    HPI: Samantha Conley is a 68 y.o. female who presents to [place] at [facility] today for a Female Wellness Visit. She also has the concerns and/or needs as listed above in the chief complaint. These will be addressed in addition to the Health Maintenance Visit.  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  Wellness Visit: annual visit with health maintenance review and exam without Pap   Doing well. Has mammo and dexa scheduled for next month. imms are up to date. Next pnuemovax due in September. No concerns today Chronic disease f/u and/or acute problem visit: (deemed necessary to be done in addition to the wellness visit):  HTN: Feeling well. Taking medications w/o adverse effects. No symptoms of CHF, angina; no palpitations, sob, cp or lower extremity edema. Compliant with meds.   HLD: fasting for recheck today. On statin. No myalgias. Has f/u with cards next month. No cp.   Mood is controlled and managed by psych  Gout on prophylactic allopurinol. No recent flares  Assessment  1. Annual physical exam   2. CRF (chronic renal failure), stage 3 (moderate) (HCC)   3. Essential hypertension   4. Idiopathic gout, unspecified chronicity, unspecified site   5. Mixed hyperlipidemia   6. Vitamin D deficiency      Plan  Female Wellness Visit:  Age appropriate Health Maintenance and Prevention measures were discussed with patient. Included topics are cancer screening recommendations, ways to keep healthy (see AVS) including dietary and exercise recommendations, regular eye and dental care, use of seat belts, and avoidance of moderate alcohol use and tobacco use.   BMI: discussed patient's BMI and encouraged positive lifestyle modifications to help get to or maintain a target BMI.  HM needs and immunizations were addressed and ordered. See  below for orders. See HM and immunization section for updates.  Routine labs and screening tests ordered including cmp, cbc and lipids where appropriate.  Discussed recommendations regarding Vit D and calcium supplementation (see AVS)  Chronic disease management visit and/or acute problem visit:  Chronic medical problems are well controlled. No med changes today. Check fasting labs.   Follow up: Return in about 6 months (around 06/30/2019).  Orders Placed This Encounter  Procedures  . CBC with Differential/Platelet  . Comprehensive metabolic panel  . Lipid panel  . TSH  . VITAMIN D 25 Hydroxy (Vit-D Deficiency, Fractures)  . Uric acid   No orders of the defined types were placed in this encounter.     Lifestyle: Body mass index is 23.82 kg/m. Wt Readings from Last 3 Encounters:  12/30/18 138 lb 12.8 oz (63 kg)  12/05/18 139 lb 3.2 oz (63.1 kg)  06/27/18 142 lb 6.4 oz (64.6 kg)   Diet: low fat, low sodium Exercise: frequently, walking  Patient Active Problem List   Diagnosis Date Noted  . Vitamin D deficiency 04/27/2017  . Mixed hyperlipidemia 04/27/2017  . CRF (chronic renal failure), stage 3 (moderate) ([place]) 04/20/2017  . 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 Bipolar disease, chronic ([place]) 03/14/2014  . Essential hypertension 12/14/2011    Dr. Daneen Schick, cardiology; 2019 cardiac CT angio: negative study 2017: neg stress ECHO   . Gout 12/14/2011   Health Maintenance  Topic Date Due  . DEXA SCAN  02/02/2019 (Originally [birth year])  . MAMMOGRAM  01/24/2019  . PNA  vac Low Risk Adult (2 of 2 - PPSV23) 08/11/2019  . TETANUS/TDAP  04/17/2027  . COLONOSCOPY  01/15/2028  . INFLUENZA VACCINE  Completed  . Hepatitis C Screening  Completed   Immunization History  Administered Date(s) Administered  . Influenza, High Dose Seasonal PF 10/15/2018, 10/15/2018, 10/15/2018  . Influenza,inj,Quad PF,6+ Mos 09/27/2017  . PPD Test 02/05/2018  . Pneumococcal  Conjugate-13 08/10/2018, 08/10/2018  . Tdap 04/16/2017  . Zoster 11/25/2014  . Zoster Recombinat (Shingrix) 07/09/2018, 09/08/2018   We updated and reviewed the patient's past history in detail and it is documented below. Allergies: Patient is allergic to lidocaine; benadryl [diphenhydramine hcl]; codeine; cortisone; doxycycline; morphine and related; promethazine; and rosuvastatin. Past Medical History Patient  has a past medical history of Allergy, Anemia, Anxiety, Arthritis, Bipolar disorder ([place]), Bipolar disorder, unspecified ([place]) (04/19/2014), Cataract, chemical imbalance (12/14/2011), Chronic kidney disease (CKD), stage III (moderate) ([place]), Dehydration (04/25/2014), Dehydration, severe (03/12/2014), Depression, Diarrhea (03/12/2014), Essential hypertension, GERD (gastroesophageal reflux disease), Gout, HLD (hyperlipidemia) (04/27/2017), Hypercalcemia (03/12/2014), Insomnia, and Lithium toxicity. Past Surgical History Patient  has a past surgical history that includes Cholecystectomy; Tonsillectomy; Nasal sinus surgery (2014); Cataract extraction, bilateral; Total abdominal hysterectomy; and Colonoscopy (N/A, 01/14/2018). Family History: Patient family history includes CAD in her father and mother; CVA in her mother; Cancer in her brother, brother, father, and paternal grandmother; Heart disease in her father, maternal grandfather, mother, and paternal grandfather; Hyperlipidemia in her mother; Hypertension in her mother; Parkinsonism in her father; Stroke in her maternal grandmother and mother. Social History:  Patient  reports that she has never smoked. She has never used smokeless tobacco. She reports that she does not drink alcohol or use drugs.  Review of Systems: Constitutional: negative for fever or malaise Ophthalmic: negative for photophobia, double vision or loss of vision Cardiovascular: negative for chest pain, dyspnea on exertion, or new LE swelling Respiratory: negative for SOB or  persistent cough Gastrointestinal: negative for abdominal pain, change in bowel habits or melena Genitourinary: negative for dysuria or gross hematuria, no abnormal uterine bleeding or disharge Musculoskeletal: negative for new gait disturbance or muscular weakness Integumentary: negative for new or persistent rashes, no breast lumps Neurological: negative for TIA or stroke symptoms Psychiatric: negative for SI or delusions Allergic/Immunologic: negative for hives  Patient Care Team    Relationship Specialty Notifications Start End  Leamon Arnt, MD PCP - General Family Medicine  06/27/18   Belva Crome, MD PCP - Cardiology Cardiology Admissions 01/04/18   Janeth Rase, NP Nurse Practitioner Psychiatry  06/27/18   Madelon Lips, MD Consulting Physician Nephrology  06/27/18   Karma Greaser, AUD  Audiology  06/27/18     Objective  Vitals: BP 126/74   Pulse 81   Temp 97.7 F (36.5 C) (Oral)   Resp 16   Ht 5\' 4"  (1.626 m)   Wt 138 lb 12.8 oz (63 kg)   SpO2 98%   BMI 23.82 kg/m  General:  Well developed, well nourished, no acute distress  Psych:  Alert and orientedx3,normal mood and affect HEENT:  Normocephalic, atraumatic, non-icteric sclera, PERRL, oropharynx is clear without mass or exudate, supple neck without adenopathy, mass or thyromegaly Cardiovascular:  Normal S1, S2, RRR without gallop, rub or murmur, nondisplaced PMI Respiratory:  Good breath sounds bilaterally, CTAB with normal respiratory effort Gastrointestinal: normal bowel sounds, soft, non-tender, no noted masses. No HSM MSK: no deformities, contusions. Joints are without erythema or swelling. Spine and CVA region are nontender Skin:  Warm, no  rashes or suspicious lesions noted Neurologic:    Mental status is normal. CN 2-11 are normal. Gross motor and sensory exams are normal. Normal gait. No tremor Breast Exam: No mass, skin retraction or nipple discharge is appreciated in either breast. No axillary  adenopathy. Fibrocystic changes are not noted   Commons side effects, risks, benefits, and alternatives for medications and treatment plan prescribed today were discussed, and the patient expressed understanding of the given instructions. Patient is instructed to call or message via MyChart if he/she has any questions or concerns regarding our treatment plan. No barriers to understanding were identified. We discussed Red Flag symptoms and signs in detail. Patient expressed understanding regarding what to do in case of urgent or emergency type symptoms.   Medication list was reconciled, printed and provided to the patient in AVS. Patient instructions and summary information was reviewed with the patient as documented in the AVS. This note was prepared with assistance of Dragon voice recognition software. Occasional wrong-word or sound-a-like substitutions may have occurred due to the inherent limitations of voice recognition software

## 2019-01-12 NOTE — Progress Notes (Signed)
Cardiology Office Note:    Date:  01/13/2019   ID:  Samantha Conley, DOB May 07, 1951, MRN [id number]  PCP:  Leamon Arnt, MD  Cardiologist:  Sinclair Grooms, MD   Referring MD: Leamon Arnt, MD   Chief Complaint  Patient presents with  . Advice Only    Primary prevention    History of Present Illness:    Samantha Conley is a 68 y.o. female with a hx of strong family history of vascular disease, hyperlipidemia (untreated), prior negative ischemic workup with stress echo [id number], diastolic left ventricular dysfunction, who presents to have longitudinal preventive cardiac management.    Samantha Conley is doing well, denies chest pain, denies dyspnea on exertion, no claudication, no edema, orthopnea, PND, or palpitations.  Strong family history of premature atherosclerosis.  She is watching her diet and getting greater than 150 minutes of moderate activity per week.  She understands primary preventive metrics.  Past Medical History:  Diagnosis Date  . Allergy   . Anemia   . Anxiety   . Arthritis    back  . Bipolar disorder ([place])   . Bipolar disorder, unspecified ([place]) 04/19/2014  . Cataract   . chemical imbalance 12/14/2011  . Chronic kidney disease (CKD), stage III (moderate) (HCC)   . Dehydration 04/25/2014  . Dehydration, severe 03/12/2014  . Depression   . Diarrhea 03/12/2014  . Essential hypertension   . GERD (gastroesophageal reflux disease)   . Gout   . HLD (hyperlipidemia) 04/27/2017  . Hypercalcemia 03/12/2014  . Insomnia   . Lithium toxicity     Past Surgical History:  Procedure Laterality Date  . CATARACT EXTRACTION, BILATERAL     Lens implant bilaterally  . CHOLECYSTECTOMY    . COLONOSCOPY N/A 01/14/2018  . NASAL SINUS SURGERY  2014  . TONSILLECTOMY    . TOTAL ABDOMINAL HYSTERECTOMY     endometriosis, bleeding    Current Medications: Current Meds  Medication Sig  . acetaminophen (TYLENOL) 500 MG tablet Take 500 mg by mouth every 6 (six) hours as needed  for mild pain.  Marland Kitchen allopurinol (ZYLOPRIM) 300 MG tablet Take 1 tablet (300 mg total) by mouth at bedtime.  . citalopram (CELEXA) 10 MG tablet Take 5 mg by mouth daily.  . divalproex (DEPAKOTE ER) 250 MG 24 hr tablet Take 250 mg by mouth 2 (two) times daily.  Marland Kitchen doxepin (SINEQUAN) 50 MG capsule Take 50 mg by mouth at bedtime.  . polyethylene glycol (MIRALAX / GLYCOLAX) packet Take 17 g by mouth daily as needed for mild constipation.  . pravastatin (PRAVACHOL) 10 MG tablet Take 1 tablet (10 mg total) by mouth daily.     Allergies:   Lidocaine; Benadryl [diphenhydramine hcl]; Codeine; Cortisone; Doxycycline; Morphine and related; Promethazine; and Rosuvastatin   Social History   Socioeconomic History  . Marital status: Single    Spouse name: Not on file  . Number of children: 0  . Years of education: 81  . Highest education level: Not on file  Occupational History  . Occupation: retired    Fish farm manager: [employer]    Comment: librarian  Social Needs  . Financial resource strain: Not hard at all  . Food insecurity:    Worry: Never true    Inability: Never true  . Transportation needs:    Medical: No    Non-medical: No  Tobacco Use  . Smoking status: Never Smoker  . Smokeless tobacco: Never Used  Substance and Sexual Activity  .  Alcohol use: No    Alcohol/week: 0.0 standard drinks  . Drug use: No  . Sexual activity: Not Currently  Lifestyle  . Physical activity:    Days per week: 7 days    Minutes per session: 30 min  . Stress: Not at all  Relationships  . Social connections:    Talks on phone: More than three times a week    Gets together: Once a week    Attends religious service: More than 4 times per year    Active member of club or organization: Yes    Attends meetings of clubs or organizations: More than 4 times per year    Relationship status: Never married  Other Topics Concern  . Not on file  Social History Narrative   Lives alone with two pets,   Never married     Two masters degrees   Worked for the state        Family History: The patient's family history includes CAD in her father and mother; CVA in her mother; Cancer in her brother, brother, father, and paternal grandmother; Heart disease in her father, maternal grandfather, mother, and paternal grandfather; Hyperlipidemia in her mother; Hypertension in her mother; Parkinsonism in her father; Stroke in her maternal grandmother and mother.  ROS:   Please see the history of present illness.    Has CKD stage III with creatinine of 1.44 in January 2020.  All other systems reviewed and are negative.  EKGs/Labs/Other Studies Reviewed:    The following studies were reviewed today: Coronary CT with morphology February 2019: IMPRESSION: 1. Coronary artery calcium score 24 Agatston units, placing the patient in the 66th percentile for age and gender. This suggests intermediate risk for future cardiac events.  2. Plaque throughout the proximal LAD. Probably no more than mild stenosis but will send for FFR to confirm.  EKG:  EKG performed on January 13, 2019 demonstrates normal sinus rhythm, nonspecific T wave flattening, and low voltage.  Recent Labs: 12/30/2018: ALT 58; BUN 24; Creatinine, Ser 1.44; Hemoglobin 13.8; Platelets 179.0; Potassium 3.8; Sodium 140; TSH 1.87  Recent Lipid Panel    Component Value Date/Time   CHOL 164 12/30/2018 0837   CHOL 189 08/20/2018 0812   TRIG 116.0 12/30/2018 0837   HDL 51.90 12/30/2018 0837   HDL 50 08/20/2018 0812   CHOLHDL 3 12/30/2018 0837   VLDL 23.2 12/30/2018 0837   LDLCALC 89 12/30/2018 0837   LDLCALC 106 (H) 08/20/2018 0812    Physical Exam:    VS:  BP 118/64   Pulse 65   Ht 5\' 4"  (1.626 m)   Wt 138 lb 9.6 oz (62.9 kg)   SpO2 99%   BMI 23.79 kg/m     Wt Readings from Last 3 Encounters:  01/13/19 138 lb 9.6 oz (62.9 kg)  12/30/18 138 lb 12.8 oz (63 kg)  12/05/18 139 lb 3.2 oz (63.1 kg)     GEN: Compatible with age and appearance.  No acute distress HEENT: Normal NECK: No JVD. LYMPHATICS: No lymphadenopathy CARDIAC: RRR.  No murmur, no gallop, no edema VASCULAR: 2+ bilateral radial and carotid pulses, no bruits RESPIRATORY:  Clear to auscultation without rales, wheezing or rhonchi  ABDOMEN: Soft, non-tender, non-distended, No pulsatile mass, MUSCULOSKELETAL: No deformity  SKIN: Warm and dry NEUROLOGIC:  Alert and oriented x 3 PSYCHIATRIC:  Normal affect   ASSESSMENT:    1. Coronary artery calcification seen on CT scan   2. CKD (chronic kidney disease) stage 4,  GFR 15-29 ml/min (HCC)   3. Essential hypertension   4. Other hyperlipidemia    PLAN:    In order of problems listed above:  1. Calcium score was less than 100.  The presence of calcium places her at increased risk but in a low to intermediate risk category.  Secondary preventative measures were discussed in detail as outlined below. 2. Etiology of CKD is uncertain.  Her CKD classes III. 3. Target blood pressure 130/80 mmHg.  Low-salt diet discussed. 4. LDL target less than 70.  We discussed her recent LDL of 89.  She does not want to use more medication and will double down on diet and exercise.  Overall education and awareness concerning primary/secondary risk prevention was discussed in detail: LDL less than 70, hemoglobin A1c less than 7, blood pressure target less than 130/80 mmHg, >150 minutes of moderate aerobic activity per week, avoidance of smoking, weight control (via diet and exercise), and continued surveillance/management of/for obstructive sleep apnea.  1 year clinical follow-up.   Medication Adjustments/Labs and Tests Ordered: Current medicines are reviewed at length with the patient today.  Concerns regarding medicines are outlined above.  Orders Placed This Encounter  Procedures  . EKG 12-Lead   No orders of the defined types were placed in this encounter.   Patient Instructions  Medication Instructions:  Your provider  recommends that you continue on your current medications as directed. Please refer to the Current Medication list given to you today.    Labwork: None  Testing/Procedures: None  Follow-Up: Your provider wants you to follow-up in: 1 year with Dr. Tamala Julian or his assistant. You will receive a reminder letter in the mail two months in advance. If you don't receive a letter, please call our office to schedule the follow-up appointment.        Signed, Sinclair Grooms, MD  01/13/2019 8:33 AM    [place]

## 2019-01-13 ENCOUNTER — Encounter: Payer: Self-pay | Admitting: Interventional Cardiology

## 2019-01-13 ENCOUNTER — Ambulatory Visit: Payer: Medicare Other | Admitting: Interventional Cardiology

## 2019-01-13 VITALS — BP 118/64 | HR 65 | Ht 64.0 in | Wt 138.6 lb

## 2019-01-13 DIAGNOSIS — I10 Essential (primary) hypertension: Secondary | ICD-10-CM | POA: Diagnosis not present

## 2019-01-13 DIAGNOSIS — N184 Chronic kidney disease, stage 4 (severe): Secondary | ICD-10-CM

## 2019-01-13 DIAGNOSIS — I2510 Atherosclerotic heart disease of native coronary artery without angina pectoris: Secondary | ICD-10-CM | POA: Diagnosis not present

## 2019-01-13 DIAGNOSIS — E7849 Other hyperlipidemia: Secondary | ICD-10-CM | POA: Diagnosis not present

## 2019-01-13 NOTE — Patient Instructions (Signed)
Medication Instructions:  Your provider recommends that you continue on your current medications as directed. Please refer to the Current Medication list given to you today.    Labwork: None  Testing/Procedures: None  Follow-Up: Your provider wants you to follow-up in: 1 year with Dr. Tamala Julian or his assistant. You will receive a reminder letter in the mail two months in advance. If you don't receive a letter, please call our office to schedule the follow-up appointment.

## 2019-01-27 ENCOUNTER — Other Ambulatory Visit: Payer: Medicare Other

## 2019-01-27 ENCOUNTER — Ambulatory Visit: Payer: Medicare Other

## 2019-02-19 ENCOUNTER — Encounter: Payer: Self-pay | Admitting: Family Medicine

## 2019-02-21 ENCOUNTER — Other Ambulatory Visit: Payer: Medicare Other

## 2019-02-21 ENCOUNTER — Ambulatory Visit: Payer: Medicare Other

## 2019-02-27 ENCOUNTER — Encounter: Payer: Self-pay | Admitting: *Deleted

## 2019-04-02 ENCOUNTER — Encounter: Payer: Medicare Other | Admitting: Family Medicine

## 2019-04-02 ENCOUNTER — Encounter: Payer: Self-pay | Admitting: Family Medicine

## 2019-04-26 ENCOUNTER — Encounter: Payer: Self-pay | Admitting: Family Medicine

## 2019-04-29 NOTE — Telephone Encounter (Signed)
Please see message . Thank you .

## 2019-05-05 ENCOUNTER — Other Ambulatory Visit: Payer: Self-pay

## 2019-05-05 ENCOUNTER — Encounter: Payer: Self-pay | Admitting: Family Medicine

## 2019-05-05 ENCOUNTER — Ambulatory Visit: Payer: Medicare Other

## 2019-05-05 ENCOUNTER — Ambulatory Visit: Payer: Medicare Other | Admitting: Family Medicine

## 2019-05-05 ENCOUNTER — Ambulatory Visit
Admission: RE | Admit: 2019-05-05 | Discharge: 2019-05-05 | Disposition: A | Discharge status: Home / Self Care | Payer: Medicare Other | Source: Ambulatory Visit | Attending: Family Medicine | Admitting: Family Medicine

## 2019-05-05 VITALS — BP 124/74 | HR 81 | Temp 98.5°F | Resp 16 | Ht 64.0 in | Wt 138.8 lb

## 2019-05-05 DIAGNOSIS — B078 Other viral warts: Secondary | ICD-10-CM | POA: Diagnosis not present

## 2019-05-05 DIAGNOSIS — I10 Essential (primary) hypertension: Secondary | ICD-10-CM

## 2019-05-05 DIAGNOSIS — L920 Granuloma annulare: Secondary | ICD-10-CM

## 2019-05-05 DIAGNOSIS — E2839 Other primary ovarian failure: Secondary | ICD-10-CM

## 2019-05-05 DIAGNOSIS — Z780 Asymptomatic menopausal state: Secondary | ICD-10-CM

## 2019-05-05 DIAGNOSIS — Z1231 Encounter for screening mammogram for malignant neoplasm of breast: Secondary | ICD-10-CM

## 2019-05-05 NOTE — Patient Instructions (Addendum)
Please return in January 2021 for your annual complete physical; please come fasting. Please cancel your appointment in July.   If you have any questions or concerns, please don't hesitate to send me a message via MyChart or call the office at [phone]. Thank you for visiting with Samantha Conley today! It's our pleasure caring for you.   Warts  Warts are small growths on the skin. They are common and can occur on many areas of the body. A person may have one wart or several warts. In many cases, warts do not require treatment. They usually go away on their own over a period of many months to a few years. If needed, warts that cause problems or do not go away on their own can be treated. What are the causes? Warts are caused by a type of virus that is called human papillomavirus (HPV).  This virus can spread from person to person through direct contact.  Warts can also spread to other areas of the body when a person scratches a wart and then scratches another area of his or her body. What increases the risk? You are more likely to develop this condition if:  You are 38-27 years old.  You have a weakened body defense system (immune system).  You are Caucasian. What are the signs or symptoms? The main symptom of this condition is small growths on the skin. Warts may:  Be round or oval or have an irregular shape.  Have a rough surface.  Range in color from skin color to light yellow, brown, or gray.  Generally be less than  inch (1.3 cm) in size.  Go away and then come back again. Most warts are painless, but some can be painful if they are large or occur in an area of the body where pressure will be applied to them, such as the bottom of the foot. How is this diagnosed? A wart can usually be diagnosed based on its appearance. In some cases, a tissue sample may be removed (biopsy) to be looked at under a microscope. How is this treated? In many cases, warts do not need treatment.  Sometimes treatment is desired. If treatment is needed or desired, options may include:  Applying medicated solutions, creams, or patches to the wart. These may be over-the-counter or prescription medicines that make the skin soft so that layers will gradually shed away. In many cases, the medicine is applied one or two times per day and covered with a bandage.  Putting duct tape over the top of the wart (occlusion). You will leave the tape in place for as long as told by your health care provider and then replace it with a new strip of tape. This is done until the wart goes away.  Freezing the wart with liquid nitrogen (cryotherapy).  Burning the wart with: ? Laser treatment. ? An electrified probe (electrocautery).  Injection of a medicine (Candida antigen) into the wart to help the body's immune system fight off the wart.  Surgery to remove the wart. Follow these instructions at home: Medicines  Apply over-the-counter and prescription medicines only as told by your health care provider.  Do not apply over-the-counter wart medicines to your face or genitals unless your health care provider tells you to do that. Lifestyle  Keep your immune system healthy. To do this: ? Eat a healthy, balanced diet. ? Get enough sleep. ? Do not use any products that contain nicotine or tobacco, such as cigarettes and e-cigarettes. If you  need help quitting, ask your health care provider. General instructions   Wash your hands after you touch a wart.  Do not scratch or pick at a wart.  Avoid shaving hair that is over a wart.  Keep all follow-up visits as told by your health care provider. This is important. Contact a health care provider if:  Your warts do not improve after treatment.  You have redness, swelling, or pain at the site of a wart.  You have bleeding from a wart that does not stop with light pressure.  You have diabetes and you develop a wart. Summary  Warts are small  growths on the skin. They are common and can occur on many areas of the body.  In many cases, warts do not need treatment. Sometimes treatment is desired. If treatment is needed or desired, there are several treatment options.  Apply over-the-counter and prescription medicines only as told by your health care provider.  Wash your hands after you touch a wart.  Keep all follow-up visits as told by your health care provider. This is important. This information is not intended to replace advice given to you by your health care provider. Make sure you discuss any questions you have with your health care provider. Document Released: 08/30/2005 Document Revised: 04/09/2018 Document Reviewed: 04/09/2018 Elsevier Interactive Patient Education  2019 [employer].

## 2019-05-05 NOTE — Progress Notes (Signed)
Subjective  CC:  Chief Complaint  Patient presents with  . Impulses    Side of head and back of head, happens at night   . Insect Bite    Left pointer finger and Right wrist, noticed 3-4 weeks ago. She states that she has been cleaning out the attack and pulling weeds  . Hypertension    HPI: Samantha Conley is a 68 y.o. female who presents to the office today to address the problems listed above in the chief complaint.  Hypertension f/u: Control is fair . Pt reports she is doing well. taking medications as instructed, no medication side effects noted, no TIAs, no chest pain on exertion, no dyspnea on exertion, no swelling of ankles. She denies adverse effects from his BP medications. Compliance with medication is good.   Has two spots as described above, no pain or itching.   Has occ twitches in scalp - she is stressed from covid. No headaches or neuro sxs. Some eye twitching as well.   Assessment  1. Essential hypertension   2. Granuloma annulare   3. Other viral warts      Plan    Hypertension f/u: BP control is well controlled. This medical condition is well controlled. There are no signs of complications, medication side effects, or red flags. Patient is instructed to continue the current treatment plan without change in therapies or medications.   Stress; reassured. "impulses" are likely related to stress and not bp. No red flags.   Hyperlipidemia f/u: well controlled. Will refill meds next mnth  Discussed etiology and prognoses of GA and wart. For now, both are small and asymptomatic. No TX needed. See avs.  Education regarding management of these chronic disease states was given. Management strategies discussed on successive visits include dietary and exercise recommendations, goals of achieving and maintaining IBW, and lifestyle modifications aiming for adequate sleep and minimizing stressors.   Follow up: jan 2021 for cpe and htn f/u/hld f/u.   No orders of the  defined types were placed in this encounter.  No orders of the defined types were placed in this encounter.     BP Readings from Last 3 Encounters:  05/05/19 124/74  01/13/19 118/64  12/30/18 126/74   Wt Readings from Last 3 Encounters:  05/05/19 138 lb 12.8 oz (63 kg)  01/13/19 138 lb 9.6 oz (62.9 kg)  12/30/18 138 lb 12.8 oz (63 kg)    Lab Results  Component Value Date   CHOL 164 12/30/2018   CHOL 189 08/20/2018   CHOL 150 05/28/2018   Lab Results  Component Value Date   HDL 51.90 12/30/2018   HDL 50 08/20/2018   HDL 55 05/28/2018   Lab Results  Component Value Date   LDLCALC 89 12/30/2018   LDLCALC 106 (H) 08/20/2018   LDLCALC 77 05/28/2018   Lab Results  Component Value Date   TRIG 116.0 12/30/2018   TRIG 163 (H) 08/20/2018   TRIG 90 05/28/2018   Lab Results  Component Value Date   CHOLHDL 3 12/30/2018   CHOLHDL 3.8 08/20/2018   CHOLHDL 2.7 05/28/2018   No results found for: LDLDIRECT Lab Results  Component Value Date   CREATININE 1.44 (H) 12/30/2018   BUN 24 (H) 12/30/2018   NA 140 12/30/2018   K 3.8 12/30/2018   CL 103 12/30/2018   CO2 28 12/30/2018    The 10-year ASCVD risk score Mikey Bussing DC Jr., et al., 2013) is: 5.4%   Values used to  calculate the score:     Age: 55 years     Sex: Female     Is Non-Hispanic African American: No     Diabetic: No     Tobacco smoker: No     Systolic Blood Pressure: 149 mmHg     Is BP treated: No     HDL Cholesterol: 51.9 mg/dL     Total Cholesterol: 164 mg/dL  I reviewed the patients updated PMH, FH, and SocHx.    Patient Active Problem List   Diagnosis Date Noted  . Vitamin D deficiency 04/27/2017  . Mixed hyperlipidemia 04/27/2017  . CRF (chronic renal failure), stage 3 (moderate) ([place]) 04/20/2017  . 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 Bipolar disease, chronic ([place]) 03/14/2014  . Essential hypertension 12/14/2011  . Gout 12/14/2011    Allergies: Lidocaine; Benadryl [diphenhydramine hcl];  Codeine; Cortisone; Doxycycline; Morphine and related; Promethazine; and Rosuvastatin  Social History: Patient  reports that she has never smoked. She has never used smokeless tobacco. She reports that she does not drink alcohol or use drugs.  Current Meds  Medication Sig  . acetaminophen (TYLENOL) 500 MG tablet Take 500 mg by mouth every 6 (six) hours as needed for mild pain.  Marland Kitchen allopurinol (ZYLOPRIM) 300 MG tablet Take 1 tablet (300 mg total) by mouth at bedtime.  . citalopram (CELEXA) 10 MG tablet Take 5 mg by mouth daily.  . divalproex (DEPAKOTE ER) 250 MG 24 hr tablet Take 250 mg by mouth 2 (two) times daily.  Marland Kitchen doxepin (SINEQUAN) 50 MG capsule Take 50 mg by mouth at bedtime.  . polyethylene glycol (MIRALAX / GLYCOLAX) packet Take 17 g by mouth daily as needed for mild constipation.  . pravastatin (PRAVACHOL) 10 MG tablet Take 1 tablet (10 mg total) by mouth daily.    Review of Systems: Cardiovascular: negative for chest pain, palpitations, leg swelling, orthopnea Respiratory: negative for SOB, wheezing or persistent cough Gastrointestinal: negative for abdominal pain Genitourinary: negative for dysuria or gross hematuria  Objective  Vitals: BP 124/74   Pulse 81   Temp 98.5 F (36.9 C) (Oral)   Resp 16   Ht 5\' 4"  (1.626 m)   Wt 138 lb 12.8 oz (63 kg)   SpO2 98%   BMI 23.82 kg/m  General: no acute distress  Psych:  Alert and oriented, normal mood and affect HEENT:  Normocephalic, atraumatic, supple neck  Cardiovascular:  RRR without murmur. no edema Respiratory:  Good breath sounds bilaterally, CTAB with normal respiratory effort Skin:  Warm, 63mm ovoid lesion with red raised border, shiny with central clearing and no flaking on right wrist; wart 3-64mm on left index finger.  Neurologic:   Mental status is normal  Commons side effects, risks, benefits, and alternatives for medications and treatment plan prescribed today were discussed, and the patient expressed  understanding of the given instructions. Patient is instructed to call or message via MyChart if he/she has any questions or concerns regarding our treatment plan. No barriers to understanding were identified. We discussed Red Flag symptoms and signs in detail. Patient expressed understanding regarding what to do in case of urgent or emergency type symptoms.   Medication list was reconciled, printed and provided to the patient in AVS. Patient instructions and summary information was reviewed with the patient as documented in the AVS. This note was prepared with assistance of Dragon voice recognition software. Occasional wrong-word or sound-a-like substitutions may have occurred due to the inherent limitations of voice recognition software

## 2019-05-06 ENCOUNTER — Encounter: Payer: Self-pay | Admitting: Family Medicine

## 2019-05-06 DIAGNOSIS — M810 Age-related osteoporosis without current pathological fracture: Secondary | ICD-10-CM | POA: Insufficient documentation

## 2019-05-06 DIAGNOSIS — M8580 Other specified disorders of bone density and structure, unspecified site: Secondary | ICD-10-CM

## 2019-05-06 HISTORY — DX: Other specified disorders of bone density and structure, unspecified site: M85.80

## 2019-05-22 ENCOUNTER — Other Ambulatory Visit: Payer: Self-pay | Admitting: Family Medicine

## 2019-06-23 ENCOUNTER — Encounter: Payer: Self-pay | Admitting: Family Medicine

## 2019-06-23 MED ORDER — PRAVASTATIN SODIUM 10 MG PO TABS: 10.0000 mg | ORAL_TABLET | Freq: Every day | ORAL | 2 refills | Status: DC

## 2019-07-01 ENCOUNTER — Ambulatory Visit: Payer: Medicare Other | Admitting: Family Medicine

## 2019-07-07 ENCOUNTER — Encounter: Payer: Self-pay | Admitting: Family Medicine

## 2019-11-06 ENCOUNTER — Other Ambulatory Visit: Payer: Self-pay | Admitting: Family Medicine

## 2019-11-20 ENCOUNTER — Telehealth: Payer: Self-pay | Admitting: Family Medicine

## 2019-11-20 NOTE — Telephone Encounter (Signed)
I called the patient to schedule AWV with Loma Sousa, but there was no answer and no option to leave a message. If patient calls back, please schedule Medicare Wellness Visit at next available opening.  VDM (Dee-Dee)

## 2019-12-01 ENCOUNTER — Encounter: Payer: Self-pay | Admitting: Family Medicine

## 2019-12-01 ENCOUNTER — Other Ambulatory Visit: Payer: Self-pay

## 2019-12-02 ENCOUNTER — Ambulatory Visit (INDEPENDENT_AMBULATORY_CARE_PROVIDER_SITE_OTHER): Payer: Medicare Other | Admitting: Family Medicine

## 2019-12-02 ENCOUNTER — Encounter: Payer: Self-pay | Admitting: Family Medicine

## 2019-12-02 ENCOUNTER — Other Ambulatory Visit: Payer: Self-pay

## 2019-12-02 VITALS — BP 136/78 | HR 75 | Temp 98.0°F | Ht 64.0 in | Wt 139.0 lb

## 2019-12-02 DIAGNOSIS — Z23 Encounter for immunization: Secondary | ICD-10-CM

## 2019-12-02 DIAGNOSIS — N183 Chronic kidney disease, stage 3 unspecified: Secondary | ICD-10-CM | POA: Diagnosis not present

## 2019-12-02 DIAGNOSIS — M25511 Pain in right shoulder: Secondary | ICD-10-CM

## 2019-12-02 DIAGNOSIS — E782 Mixed hyperlipidemia: Secondary | ICD-10-CM | POA: Diagnosis not present

## 2019-12-02 DIAGNOSIS — E559 Vitamin D deficiency, unspecified: Secondary | ICD-10-CM

## 2019-12-02 DIAGNOSIS — I10 Essential (primary) hypertension: Secondary | ICD-10-CM | POA: Diagnosis not present

## 2019-12-02 DIAGNOSIS — M546 Pain in thoracic spine: Secondary | ICD-10-CM

## 2019-12-02 DIAGNOSIS — M1000 Idiopathic gout, unspecified site: Secondary | ICD-10-CM

## 2019-12-02 LAB — LIPID PANEL
Cholesterol: 173 mg/dL (ref 0–200)
HDL: 58.4 mg/dL (ref >39.00)
LDL Cholesterol: 90 mg/dL (ref 0–99)
NonHDL: 114.88
Total CHOL/HDL Ratio: 3
Triglycerides: 123 mg/dL (ref 0.0–149.0)
VLDL: 24.6 mg/dL (ref 0.0–40.0)

## 2019-12-02 LAB — POCT URINALYSIS DIPSTICK
Bilirubin, UA: NEGATIVE
Blood, UA: NEGATIVE
Glucose, UA: NEGATIVE
Ketones, UA: NEGATIVE
Nitrite, UA: NEGATIVE
Protein, UA: NEGATIVE
Spec Grav, UA: 1.01 (ref 1.010–1.025)
Urobilinogen, UA: 0.2 E.U./dL
pH, UA: 6.5 (ref 5.0–8.0)

## 2019-12-02 LAB — COMPREHENSIVE METABOLIC PANEL
ALT: 21 U/L (ref 0–35)
AST: 29 U/L (ref 0–37)
Albumin: 4.3 g/dL (ref 3.5–5.2)
Alkaline Phosphatase: 81 U/L (ref 39–117)
BUN: 17 mg/dL (ref 6–23)
CO2: 29 mEq/L (ref 19–32)
Calcium: 9.7 mg/dL (ref 8.4–10.5)
Chloride: 106 mEq/L (ref 96–112)
Creatinine, Ser: 1.46 mg/dL — ABNORMAL HIGH (ref 0.40–1.20)
GFR: 35.72 mL/min — ABNORMAL LOW (ref >60.00)
Glucose, Bld: 82 mg/dL (ref 70–99)
Potassium: 4.3 mEq/L (ref 3.5–5.1)
Sodium: 141 mEq/L (ref 135–145)
Total Bilirubin: 0.9 mg/dL (ref 0.2–1.2)
Total Protein: 6.3 g/dL (ref 6.0–8.3)

## 2019-12-02 LAB — CBC WITH DIFFERENTIAL/PLATELET
Basophils Absolute: 0.1 10*3/uL (ref 0.0–0.1)
Basophils Relative: 1 % (ref 0.0–3.0)
Eosinophils Absolute: 0.1 10*3/uL (ref 0.0–0.7)
Eosinophils Relative: 2.5 % (ref 0.0–5.0)
HCT: 39.5 % (ref 36.0–46.0)
Hemoglobin: 12.9 g/dL (ref 12.0–15.0)
Lymphocytes Relative: 45.7 % (ref 12.0–46.0)
Lymphs Abs: 2.6 10*3/uL (ref 0.7–4.0)
MCHC: 32.6 g/dL (ref 30.0–36.0)
MCV: 93.8 fl (ref 78.0–100.0)
Monocytes Absolute: 0.3 10*3/uL (ref 0.1–1.0)
Monocytes Relative: 4.7 % (ref 3.0–12.0)
Neutro Abs: 2.6 10*3/uL (ref 1.4–7.7)
Neutrophils Relative %: 46.1 % (ref 43.0–77.0)
Platelets: 188 10*3/uL (ref 150.0–400.0)
RBC: 4.21 Mil/uL (ref 3.87–5.11)
RDW: 12.9 % (ref 11.5–15.5)
WBC: 5.6 10*3/uL (ref 4.0–10.5)

## 2019-12-02 LAB — VITAMIN D 25 HYDROXY (VIT D DEFICIENCY, FRACTURES): VITD: 24.93 ng/mL — ABNORMAL LOW (ref 30.00–100.00)

## 2019-12-02 LAB — TSH: TSH: 1.41 u[IU]/mL (ref 0.35–4.50)

## 2019-12-02 LAB — URIC ACID: Uric Acid, Serum: 3.3 mg/dL (ref 2.4–7.0)

## 2019-12-02 NOTE — Progress Notes (Signed)
Subjective  CC:  Chief Complaint  Patient presents with  . Right shoulder pain    HPI: Samantha Conley is a 68 y.o. female who presents to the office today to address the problems listed above in the chief complaint.  68 yo w/ several month h/o right upper back and shoulder pain w/o injury. 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 She will resign from this job in 2 weeks and feels that may help. Reports increased pain with prolonged standing. No problem with reaching or lifting with right arm. Has used tylenol with some relief; no radicular sxs.   HLD and HTN f/u: due bloodwork. Doing well on meds w/o AEs. Feeling well. Taking medications w/o adverse effects. No symptoms of CHF, angina; no palpitations, sob, cp or lower extremity edema. Compliant with meds.   Gout: stable w/o recent flares. On allopurinol.   CKD: no edema, no nausea.  HM: screens are up to date. Due pnuemovax Assessment  1. Acute right-sided thoracic back pain   2. Nontraumatic shoulder pain, right   3. Mixed hyperlipidemia   4. CRF (chronic renal failure), stage 3 (moderate)   5. Essential hypertension   6. Idiopathic gout, unspecified chronicity, unspecified site   7. Vitamin D deficiency      Plan   Back pain:  MSK in nature. Education. She will see if improves after she stops working. Will return for f/u and xrays next month. Continue tylenol. Declines PT or mm relaxer at this time. Avoiding nsaids due to ckd. No red flags  Recheck labs for chronic medical problems that have been well controlled.   Follow up: No follow-ups on file.  12/10/2019  Orders Placed This Encounter  Procedures  . CBC w/Diff  . CMP  . Lipids  . TSH  . Uric acid  . Vit D 25OH  . UA POCT   No orders of the defined types were placed in this encounter.     I reviewed the patients updated PMH, FH, and SocHx.    Patient Active Problem List   Diagnosis Date Noted  .  Mixed hyperlipidemia 04/27/2017    Priority: High  . CRF (chronic renal failure), stage 3 (moderate) 04/20/2017    Priority: High  . Bipolar disease, chronic ([place]) 03/14/2014    Priority: High  . Essential hypertension 12/14/2011    Priority: High  . Osteopenia 05/06/2019    Priority: Medium  . Laryngopharyngeal reflux (LPR) 10/25/2016    Priority: Medium  . Vitamin D deficiency 04/27/2017    Priority: Low  . Gout 12/14/2011    Priority: Low   Current Meds  Medication Sig  . acetaminophen (TYLENOL) 500 MG tablet Take 500 mg by mouth every 6 (six) hours as needed for mild pain.  Marland Kitchen allopurinol (ZYLOPRIM) 300 MG tablet TAKE 1 TABLET BY MOUTH AT  BEDTIME  . citalopram (CELEXA) 10 MG tablet Take 5 mg by mouth daily.  . divalproex (DEPAKOTE ER) 250 MG 24 hr tablet Take 250 mg by mouth 2 (two) times daily.  Marland Kitchen doxepin (SINEQUAN) 50 MG capsule Take 50 mg by mouth at bedtime.  . polyethylene glycol (MIRALAX / GLYCOLAX) packet Take 17 g by mouth daily as needed for mild constipation.  . pravastatin (PRAVACHOL) 10 MG tablet Take 1 tablet (10 mg total) by mouth daily.    Allergies: Patient is allergic to lidocaine; benadryl [diphenhydramine hcl]; codeine; cortisone; doxycycline; morphine and related;  promethazine; and rosuvastatin. Family History: Patient family history includes CAD in her father and mother; CVA in her mother; Cancer in her brother, brother, father, and paternal grandmother; Heart disease in her father, maternal grandfather, mother, and paternal grandfather; Hyperlipidemia in her mother; Hypertension in her mother; Parkinsonism in her father; Stroke in her maternal grandmother and mother. Social History:  Patient  reports that she has never smoked. She has never used smokeless tobacco. She reports that she does not drink alcohol or use drugs.  Review of Systems: Constitutional: Negative for fever malaise or anorexia Cardiovascular: negative for chest pain Respiratory:  negative for SOB or persistent cough Gastrointestinal: negative for abdominal pain  Objective  Vitals: BP 136/78 (BP Location: Right Arm, Patient Position: Sitting, Cuff Size: Normal)   Pulse 75   Temp 98 F (36.7 C) (Temporal)   Ht 5\' 4"  (1.626 m)   Wt 139 lb (63 kg)   SpO2 97%   BMI 23.86 kg/m  General: no acute distress , A&Ox3 HEENT: PEERL, conjunctiva normal, Oropharynx moist,neck is supple Cardiovascular:  RRR without murmur or gallop.  Respiratory:  Good breath sounds bilaterally, CTAB with normal respiratory effort Skin:  Warm, no rashes Back: right subscapular ttp, FROM nl right shoulder exam.      Commons side effects, risks, benefits, and alternatives for medications and treatment plan prescribed today were discussed, and the patient expressed understanding of the given instructions. Patient is instructed to call or message via MyChart if he/she has any questions or concerns regarding our treatment plan. No barriers to understanding were identified. We discussed Red Flag symptoms and signs in detail. Patient expressed understanding regarding what to do in case of urgent or emergency type symptoms.   Medication list was reconciled, printed and provided to the patient in AVS. Patient instructions and summary information was reviewed with the patient as documented in the AVS. This note was prepared with assistance of Dragon voice recognition software. Occasional wrong-word or sound-a-like substitutions may have occurred due to the inherent limitations of voice recognition software  This visit occurred during the SARS-CoV-2 public health emergency.  Safety protocols were in place, including screening questions prior to the visit, additional usage of staff PPE, and extensive cleaning of exam room while observing appropriate contact time as indicated for disinfecting solutions.

## 2019-12-02 NOTE — Patient Instructions (Addendum)
Please return in 4 weeks to recheck shoulder and back pain and get xrays.  I will release your lab results to you on your MyChart account with further instructions. Please reply with any questions.  Your allopurinol has 3 refills on it so call your pharmacy to get your next 90 day supply.  Today you were given your Pneumovax vaccination. You will not need anymore pneumonia vaccinations now.   If you have any questions or concerns, please don't hesitate to send me a message via MyChart or call the office at [phone]. Thank you for visiting with [place] today! It's our pleasure caring for you.

## 2019-12-03 ENCOUNTER — Encounter: Payer: Self-pay | Admitting: Family Medicine

## 2019-12-03 ENCOUNTER — Other Ambulatory Visit: Payer: Self-pay

## 2019-12-03 DIAGNOSIS — I10 Essential (primary) hypertension: Secondary | ICD-10-CM

## 2019-12-03 DIAGNOSIS — M1000 Idiopathic gout, unspecified site: Secondary | ICD-10-CM

## 2019-12-03 MED ORDER — ALLOPURINOL 300 MG PO TABS: 300.0000 mg | ORAL_TABLET | Freq: Every day | ORAL | 3 refills | Status: DC

## 2019-12-09 ENCOUNTER — Inpatient Hospital Stay: Admission: RE | Admit: 2019-12-09 | Payer: Self-pay | Source: Ambulatory Visit

## 2019-12-09 ENCOUNTER — Ambulatory Visit (INDEPENDENT_AMBULATORY_CARE_PROVIDER_SITE_OTHER): Payer: Medicare PPO

## 2019-12-09 ENCOUNTER — Other Ambulatory Visit: Payer: Medicare PPO

## 2019-12-09 ENCOUNTER — Other Ambulatory Visit: Payer: Self-pay

## 2019-12-09 DIAGNOSIS — M546 Pain in thoracic spine: Secondary | ICD-10-CM | POA: Diagnosis not present

## 2019-12-10 ENCOUNTER — Ambulatory Visit: Payer: Medicare Other | Admitting: Family Medicine

## 2019-12-10 ENCOUNTER — Encounter: Payer: Self-pay | Admitting: Family Medicine

## 2020-01-06 ENCOUNTER — Telehealth: Payer: Self-pay | Admitting: Family Medicine

## 2020-01-06 ENCOUNTER — Encounter: Payer: Self-pay | Admitting: Family Medicine

## 2020-01-06 NOTE — Telephone Encounter (Signed)
Pt called stating she received a bill from her old insurance for her visit on 12/09/2019. Told pt her new insurance would be entered and attached to the visit for it to be re-billed.

## 2020-01-25 ENCOUNTER — Ambulatory Visit: Payer: Medicare PPO | Attending: Internal Medicine

## 2020-01-25 DIAGNOSIS — Z23 Encounter for immunization: Secondary | ICD-10-CM | POA: Insufficient documentation

## 2020-01-25 NOTE — Progress Notes (Signed)
   Covid-19 Vaccination Clinic  Name:  Samantha Conley    MRN: [id number] DOB: Mar 01, 1951  01/25/2020  [name] was observed post Covid-19 immunization for 30 minutes based on pre-vaccination screening without incidence. She was provided with Vaccine Information Sheet and instruction to access the V-Safe system.   [name] was instructed to call 911 with any severe reactions post vaccine: Marland Kitchen Difficulty breathing  . Swelling of your face and throat  . A fast heartbeat  . A bad rash all over your body  . Dizziness and weakness    Immunizations Administered    Name Date Dose VIS Date Route   Pfizer COVID-19 Vaccine 01/25/2020  3:00 PM 0.3 mL 11/14/2019 Intramuscular   Manufacturer: [place]   Lot: [id number]   [place]: [id number]

## 2020-02-02 NOTE — Telephone Encounter (Signed)
Pt called stating she received a bill from the same encounter in January.   Please look into the following billing question below.   Patient has already called billing and was told: Insurance would be resubmitted    Patient Name:  Samantha Conley MRN: [id number] DOB: [birth year] Date of Service: 12/09/2019 x-ray Amount: $80.00   Patient was advised that this process can take up to a week or more. Patient also was advised that they may receive additional bills while this is being looked into and they are to hold onto all statements until resolved.

## 2020-02-03 NOTE — Progress Notes (Signed)
Cardiology Office Note:    Date:  02/04/2020   ID:  Samantha Conley, DOB May 06, 1951, MRN [id number]  PCP:  Leamon Arnt, MD  Cardiologist:  Sinclair Grooms, MD   Referring MD: Leamon Arnt, MD   Chief Complaint  Patient presents with  . Hyperlipidemia    History of Present Illness:    Samantha Conley is a 69 y.o. female with a hx of strong family history of vascular disease, hyperlipidemia (untreated), prior negative ischemic workup with stress echo 0000000, diastolic left ventricular dysfunction, who presents to have longitudinal preventive cardiac management.  She is doing okay.  She denies chest pain.  She is compliant with her preventive regimen which includes a low-dose statin.  She is off blood pressure medication.  She has no physical limitations.  Past Medical History:  Diagnosis Date  . Allergy   . Anemia   . Anxiety   . Arthritis    back  . Bipolar disorder ([place])   . Bipolar disorder, unspecified ([place]) 04/19/2014  . Cataract   . chemical imbalance 12/14/2011  . Chronic kidney disease (CKD), stage III (moderate)   . Dehydration 04/25/2014  . Dehydration, severe 03/12/2014  . Depression   . Diarrhea 03/12/2014  . Essential hypertension   . GERD (gastroesophageal reflux disease)   . Gout   . HLD (hyperlipidemia) 04/27/2017  . Hypercalcemia 03/12/2014  . Insomnia   . Lithium toxicity   . Osteopenia 05/06/2019   DEXA: 05/2019: lowest T = -2.3 at left femur. Recheck 2 years.     Past Surgical History:  Procedure Laterality Date  . CATARACT EXTRACTION, BILATERAL     Lens implant bilaterally  . CHOLECYSTECTOMY    . COLONOSCOPY N/A 01/14/2018  . NASAL SINUS SURGERY  2014  . TONSILLECTOMY    . TOTAL ABDOMINAL HYSTERECTOMY     endometriosis, bleeding    Current Medications: Current Meds  Medication Sig  . acetaminophen (TYLENOL) 500 MG tablet Take 500 mg by mouth every 6 (six) hours as needed for mild pain.  Marland Kitchen allopurinol (ZYLOPRIM) 300 MG tablet Take 1 tablet  (300 mg total) by mouth at bedtime.  . citalopram (CELEXA) 10 MG tablet Take 5 mg by mouth daily.  . divalproex (DEPAKOTE ER) 250 MG 24 hr tablet Take 250 mg by mouth 2 (two) times daily.  Marland Kitchen doxepin (SINEQUAN) 50 MG capsule Take 50 mg by mouth at bedtime.  . polyethylene glycol (MIRALAX / GLYCOLAX) packet Take 17 g by mouth daily as needed for mild constipation.  . pravastatin (PRAVACHOL) 10 MG tablet Take 1 tablet (10 mg total) by mouth daily.     Allergies:   Lidocaine, Benadryl [diphenhydramine hcl], Codeine, Cortisone, Doxycycline, Morphine and related, Promethazine, and Rosuvastatin   Social History   Socioeconomic History  . Marital status: Single    Spouse name: Not on file  . Number of children: 0  . Years of education: 42  . Highest education level: Not on file  Occupational History  . Occupation: retired    Fish farm manager: [employer]    Comment: librarian  Tobacco Use  . Smoking status: Never Smoker  . Smokeless tobacco: Never Used  Substance and Sexual Activity  . Alcohol use: No    Alcohol/week: 0.0 standard drinks  . Drug use: No  . Sexual activity: Not Currently  Other Topics Concern  . Not on file  Social History Narrative   Lives alone with two pets,   Never married  Two masters degrees   Worked for the state      Social Determinants of Radio broadcast assistant Strain:   . Difficulty of Paying Living Expenses: Not on file  Food Insecurity:   . Worried About Charity fundraiser in the Last Year: Not on file  . Ran Out of Food in the Last Year: Not on file  Transportation Needs:   . Lack of Transportation (Medical): Not on file  . Lack of Transportation (Non-Medical): Not on file  Physical Activity:   . Days of Exercise per Week: Not on file  . Minutes of Exercise per Session: Not on file  Stress:   . Feeling of Stress : Not on file  Social Connections:   . Frequency of Communication with Friends and Family: Not on file  . Frequency of Social  Gatherings with Friends and Family: Not on file  . Attends Religious Services: Not on file  . Active Member of Clubs or Organizations: Not on file  . Attends Archivist Meetings: Not on file  . Marital Status: Not on file     Family History: The patient's family history includes CAD in her father and mother; CVA in her mother; Cancer in her brother, brother, father, and paternal grandmother; Heart disease in her father, maternal grandfather, mother, and paternal grandfather; Hyperlipidemia in her mother; Hypertension in her mother; Parkinsonism in her father; Stroke in her maternal grandmother and mother.  ROS:   Please see the history of present illness.    Neck and shoulder pain.  She wants me to order an MRI.  She informs me that her blood pressure medications have been stopped.  All other systems reviewed and are negative.  EKGs/Labs/Other Studies Reviewed:    The following studies were reviewed today: No new data  EKG:  EKG normal sinus rhythm with horizontal axis.  Overall normal in appearance.  Recent Labs: 12/02/2019: ALT 21; BUN 17; Creatinine, Ser 1.46; Hemoglobin 12.9; Platelets 188.0; Potassium 4.3; Sodium 141; TSH 1.41  Recent Lipid Panel    Component Value Date/Time   CHOL 173 12/02/2019 1101   CHOL 189 08/20/2018 0812   TRIG 123.0 12/02/2019 1101   HDL 58.40 12/02/2019 1101   HDL 50 08/20/2018 0812   CHOLHDL 3 12/02/2019 1101   VLDL 24.6 12/02/2019 1101   LDLCALC 90 12/02/2019 1101   LDLCALC 106 (H) 08/20/2018 0812    Physical Exam:    VS:  BP (!) 142/76   Pulse 76   Ht 5\' 4"  (1.626 m)   Wt 144 lb (65.3 kg)   SpO2 97%   BMI 24.72 kg/m     Wt Readings from Last 3 Encounters:  02/04/20 144 lb (65.3 kg)  12/02/19 139 lb (63 kg)  05/05/19 138 lb 12.8 oz (63 kg)     GEN: Compatible with age. No acute distress HEENT: Normal NECK: No JVD. LYMPHATICS: No lymphadenopathy CARDIAC:  RRR without murmur, gallop, or edema. VASCULAR:  Normal Pulses.  No bruits. RESPIRATORY:  Clear to auscultation without rales, wheezing or rhonchi  ABDOMEN: Soft, non-tender, non-distended, No pulsatile mass, MUSCULOSKELETAL: No deformity  SKIN: Warm and dry NEUROLOGIC:  Alert and oriented x 3 PSYCHIATRIC:  Normal affect   ASSESSMENT:    1. Coronary artery calcification seen on CT scan   2. CKD (chronic kidney disease) stage 4, GFR 15-29 ml/min (HCC)   3. Essential hypertension   4. Other hyperlipidemia   5. Educated about COVID-19 virus infection  PLAN:    In order of problems listed above:  1. Primary prevention discussed.  Encourage aerobic activity. 2. Stage III CKD.  Most recent creatinine 1.5. 3. Repeat blood pressure 132/78 4. LDL cholesterol was 90.  No change in current therapy made but rather asked the patient to observe a low-fat diet. 5. COVID-19 vaccine is advocated.  Social distancing, mask wearing, handwashing is advocated.  Overall education and awareness concerning primary/secondary risk prevention was discussed in detail: LDL less than 70, hemoglobin A1c less than 7, blood pressure target less than 130/80 mmHg, >150 minutes of moderate aerobic activity per week, avoidance of smoking, weight control (via diet and exercise), and continued surveillance/management of/for obstructive sleep apnea.    Medication Adjustments/Labs and Tests Ordered: Current medicines are reviewed at length with the patient today.  Concerns regarding medicines are outlined above.  Orders Placed This Encounter  Procedures  . EKG 12-Lead   No orders of the defined types were placed in this encounter.   There are no Patient Instructions on file for this visit.   Signed, Sinclair Grooms, MD  02/04/2020 3:37 PM    [facility]

## 2020-02-04 ENCOUNTER — Ambulatory Visit: Payer: Medicare PPO | Admitting: Interventional Cardiology

## 2020-02-04 ENCOUNTER — Encounter: Payer: Self-pay | Admitting: Interventional Cardiology

## 2020-02-04 ENCOUNTER — Other Ambulatory Visit: Payer: Self-pay

## 2020-02-04 VITALS — BP 142/76 | HR 76 | Ht 64.0 in | Wt 144.0 lb

## 2020-02-04 DIAGNOSIS — E7849 Other hyperlipidemia: Secondary | ICD-10-CM

## 2020-02-04 DIAGNOSIS — I2510 Atherosclerotic heart disease of native coronary artery without angina pectoris: Secondary | ICD-10-CM

## 2020-02-04 DIAGNOSIS — I10 Essential (primary) hypertension: Secondary | ICD-10-CM

## 2020-02-04 DIAGNOSIS — N184 Chronic kidney disease, stage 4 (severe): Secondary | ICD-10-CM

## 2020-02-04 DIAGNOSIS — Z7189 Other specified counseling: Secondary | ICD-10-CM

## 2020-02-04 NOTE — Patient Instructions (Signed)

## 2020-02-09 NOTE — Telephone Encounter (Signed)
Left pt vm that this has been resubmitted through to [facility] and that we are waiting on a response from insurance.

## 2020-02-18 ENCOUNTER — Ambulatory Visit: Payer: Medicare PPO | Attending: Internal Medicine

## 2020-02-18 DIAGNOSIS — Z23 Encounter for immunization: Secondary | ICD-10-CM

## 2020-02-18 NOTE — Progress Notes (Signed)
   Covid-19 Vaccination Clinic  Name:  Samantha Conley    MRN: [id number] DOB: 1951-11-17  02/18/2020  Samantha Conley was observed post Covid-19 immunization for 30 minutes based on pre-vaccination screening without incident. She was provided with Vaccine Information Sheet and instruction to access the V-Safe system.   Samantha Conley was instructed to call 911 with any severe reactions post vaccine: Marland Kitchen Difficulty breathing  . Swelling of face and throat  . A fast heartbeat  . A bad rash all over body  . Dizziness and weakness   Immunizations Administered    Name Date Dose VIS Date Route   Pfizer COVID-19 Vaccine 02/18/2020  1:31 PM 0.3 mL 11/14/2019 Intramuscular   Manufacturer: [place]   Lot: [id number]   [place]: [id number]

## 2020-02-24 ENCOUNTER — Encounter: Payer: Self-pay | Admitting: Family Medicine

## 2020-02-24 DIAGNOSIS — M1990 Unspecified osteoarthritis, unspecified site: Secondary | ICD-10-CM

## 2020-02-24 DIAGNOSIS — M19012 Primary osteoarthritis, left shoulder: Secondary | ICD-10-CM | POA: Insufficient documentation

## 2020-02-24 HISTORY — DX: Unspecified osteoarthritis, unspecified site: M19.90

## 2020-03-23 ENCOUNTER — Other Ambulatory Visit: Payer: Self-pay | Admitting: Family Medicine

## 2020-03-23 DIAGNOSIS — Z1231 Encounter for screening mammogram for malignant neoplasm of breast: Secondary | ICD-10-CM

## 2020-03-25 ENCOUNTER — Ambulatory Visit (INDEPENDENT_AMBULATORY_CARE_PROVIDER_SITE_OTHER): Payer: Medicare PPO

## 2020-03-25 ENCOUNTER — Other Ambulatory Visit: Payer: Self-pay

## 2020-03-25 DIAGNOSIS — Z0000 Encounter for general adult medical examination without abnormal findings: Secondary | ICD-10-CM | POA: Diagnosis not present

## 2020-03-25 DIAGNOSIS — E782 Mixed hyperlipidemia: Secondary | ICD-10-CM

## 2020-03-25 MED ORDER — PRAVASTATIN SODIUM 10 MG PO TABS: 10.0000 mg | ORAL_TABLET | Freq: Every day | ORAL | 2 refills | Status: DC

## 2020-03-25 NOTE — Progress Notes (Signed)
This visit is being conducted via phone call due to the COVID-19 pandemic. This patient has given me verbal consent via phone to conduct this visit, patient states they are participating from their home address. Some vital signs may be absent or patient reported.   Patient identification: identified by name, DOB, and current address.  Location provider: [facility], Office Persons participating in the virtual visit: Denman George LPN, patient, and Dr. Billey Chang     Subjective:   Samantha Conley is a 69 y.o. female who presents for Medicare Annual (Subsequent) preventive examination.  Review of Systems:   Cardiac Risk Factors include: dyslipidemia;advanced age (>43men, >15 women);hypertension    Objective:     Vitals: There were no vitals taken for this visit.  There is no height or weight on file to calculate BMI.  Advanced Directives 03/25/2020 03/25/2018 03/08/2017 05/02/2016 05/26/2015 11/29/2014 04/23/2014  Does Patient Have a Medical Advance Directive? Yes Yes Yes No No No Patient does not have advance directive  Type of Advance Directive Living will;Healthcare Power of [place];Living will - - - - -  Does patient want to make changes to medical advance directive? No - Patient declined No - Patient declined - - - - -  Copy of [place] in Chart? No - copy requested No - copy requested - - - - -  Would patient like information on creating a medical advance directive? - - - No - patient declined information - No - patient declined information -  Pre-existing out of facility DNR order (yellow form or pink MOST form) - - - - - - -    Tobacco Social History   Tobacco Use  Smoking Status Never Smoker  Smokeless Tobacco Never Used     Counseling given: Not Answered   Clinical Intake:  Pre-visit preparation completed: Yes  Pain : No/denies pain  Diabetes: No  How often do you need to have someone help you when you read  instructions, pamphlets, or other written materials from your doctor or pharmacy?: 1 - Never  Interpreter Needed?: No  Information entered by :: Denman George LPN  Past Medical History:  Diagnosis Date  . Allergy   . Anemia   . Anxiety   . Arthritis    back  . Bipolar disorder ([place])   . Bipolar disorder, unspecified ([place]) 04/19/2014  . Cataract   . chemical imbalance 12/14/2011  . Chronic kidney disease (CKD), stage III (moderate)   . Dehydration 04/25/2014  . Dehydration, severe 03/12/2014  . Depression   . Diarrhea 03/12/2014  . Essential hypertension   . GERD (gastroesophageal reflux disease)   . Gout   . HLD (hyperlipidemia) 04/27/2017  . Hypercalcemia 03/12/2014  . Insomnia   . Lithium toxicity   . Osteoarthritis - DJD cervical, thoracic, lumbar 02/24/2020  . Osteopenia 05/06/2019   DEXA: 05/2019: lowest T = -2.3 at left femur. Recheck 2 years.    Past Surgical History:  Procedure Laterality Date  . CATARACT EXTRACTION, BILATERAL     Lens implant bilaterally  . CHOLECYSTECTOMY    . COLONOSCOPY N/A 01/14/2018  . NASAL SINUS SURGERY  2014  . TONSILLECTOMY    . TOTAL ABDOMINAL HYSTERECTOMY     endometriosis, bleeding   Family History  Problem Relation Age of Onset  . CAD Mother   . CVA Mother   . Heart disease Mother   . Hyperlipidemia Mother   . Hypertension Mother   .  Stroke Mother   . CAD Father   . Parkinsonism Father   . Cancer Father        melanoma  . Heart disease Father   . Cancer Brother        prostate  . Cancer Brother        melanoma  . Stroke Maternal Grandmother   . Heart disease Maternal Grandfather   . Cancer Paternal Grandmother        colon  . Heart disease Paternal Grandfather    Social History   Socioeconomic History  . Marital status: Single    Spouse name: Not on file  . Number of children: 0  . Years of education: 77  . Highest education level: Not on file  Occupational History  . 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 Smoking status: Never Smoker  . Smokeless tobacco: Never Used  Substance and Sexual Activity  . Alcohol use: No    Alcohol/week: 0.0 standard drinks  . Drug use: No  . Sexual activity: Not Currently  Other Topics Concern  . Not on file  Social History Narrative   Lives alone with two pets,   Never married   Two masters degrees   Worked for the state      Social Determinants of Radio broadcast assistant Strain:   . Difficulty of Paying Living Expenses:   Food Insecurity:   . Worried About Charity fundraiser in the Last Year:   . Arboriculturist in the Last Year:   Transportation Needs:   . Film/video editor (Medical):   Marland Kitchen Lack of Transportation (Non-Medical):   Physical Activity:   . Days of Exercise per Week:   . Minutes of Exercise per Session:   Stress:   . Feeling of Stress :   Social Connections:   . Frequency of Communication with Friends and Family:   . Frequency of Social Gatherings with Friends and Family:   . Attends Religious Services:   . Active Member of Clubs or Organizations:   . Attends Archivist Meetings:   Marland Kitchen Marital Status:     Outpatient Encounter Medications as of 03/25/2020  Medication Sig  . acetaminophen (TYLENOL) 500 MG tablet Take 500 mg by mouth every 6 (six) hours as needed for mild pain.  Marland Kitchen allopurinol (ZYLOPRIM) 300 MG tablet Take 1 tablet (300 mg total) by mouth at bedtime.  . citalopram (CELEXA) 10 MG tablet Take 5 mg by mouth daily.  . divalproex (DEPAKOTE ER) 250 MG 24 hr tablet Take 250 mg by mouth 2 (two) times daily.  Marland Kitchen doxepin (SINEQUAN) 50 MG capsule Take 50 mg by mouth at bedtime.  . polyethylene glycol (MIRALAX / GLYCOLAX) packet Take 17 g by mouth daily as needed for mild constipation.  . [DISCONTINUED] pravastatin (PRAVACHOL) 10 MG tablet Take 1 tablet (10 mg total) by mouth daily.   No facility-administered encounter medications on file as of 03/25/2020.    Activities  of Daily Living In your present state of health, do you have any difficulty performing the following activities: 03/25/2020  Hearing? N  Vision? N  Difficulty concentrating or making decisions? N  Walking or climbing stairs? N  Dressing or bathing? N  Doing errands, shopping? N  Using the Toilet? N  In the past six months, have you accidently leaked urine? N  Do you have problems with loss of  bowel control? N  Managing your Medications? N  Managing your Finances? N  Housekeeping or managing your Housekeeping? N  Some recent data might be hidden    Patient Care Team: Leamon Arnt, MD as PCP - General (Family Medicine) Belva Crome, MD as PCP - Cardiology (Cardiology) Janeth Rase, NP as Nurse Practitioner (Psychiatry) Madelon Lips, MD as Consulting Physician (Nephrology) Karma Greaser, AUD (Audiology)    Assessment:   This is a routine wellness examination for Samantha Conley.  Exercise Activities and Dietary recommendations Current Exercise Habits: The patient does not participate in regular exercise at present  Goals    . Exercise 150 minutes per week (moderate activity)       Fall Risk Fall Risk  03/25/2020 12/30/2018 03/25/2018 09/27/2017 04/20/2017  Falls in the past year? 0 0 No No Yes  Number falls in past yr: 0 0 - - 1  Injury with Fall? 0 0 - - Yes  Follow up Falls evaluation completed;Education provided;Falls prevention discussed Falls evaluation completed - - -   Is the patient's home free of loose throw rugs in walkways, pet beds, electrical cords, etc?   yes      Grab bars in the bathroom? yes      Handrails on the stairs?   yes      Adequate lighting?   yes   Depression Screen PHQ 2/9 Scores 03/25/2020 12/30/2018 06/27/2018 03/25/2018  PHQ - 2 Score 0 0 0 0  PHQ- 9 Score - - 0 -     Cognitive Function- no cognitive concerns at this time      6CIT Screen 03/25/2020 03/25/2018  What Year? 0 points 0 points  What month? 0 points 0 points  What time? 0  points 0 points  Count back from 20 0 points 0 points  Months in reverse 0 points 0 points  Repeat phrase 0 points 0 points  Total Score 0 0    Immunization History  Administered Date(s) Administered  . Influenza, High Dose Seasonal PF 10/15/2018, 10/15/2018, 10/15/2018, 09/26/2019  . Influenza,inj,Quad PF,6+ Mos 09/27/2017  . PFIZER SARS-COV-2 Vaccination 01/25/2020, 02/18/2020  . PPD Test 02/05/2018  . Pneumococcal Conjugate-13 08/10/2018, 08/10/2018  . Pneumococcal Polysaccharide-23 12/02/2019  . Tdap 04/16/2017  . Zoster 11/25/2014  . Zoster Recombinat (Shingrix) 07/09/2018, 09/08/2018    Qualifies for Shingles Vaccine?Shingrix completed   Screening Tests Health Maintenance  Topic Date Due  . MAMMOGRAM  05/04/2020  . INFLUENZA VACCINE  07/04/2020  . DEXA SCAN  05/04/2021  . TETANUS/TDAP  04/17/2027  . COLONOSCOPY  01/15/2028  . COVID-19 Vaccine  Completed  . Hepatitis C Screening  Completed  . PNA vac Low Risk Adult  Completed    Cancer Screenings: Lung: Low Dose CT Chest recommended if Age 33-80 years, 30 pack-year currently smoking OR have quit w/in 15years. Patient does not qualify. Breast:  Up to date on Mammogram? Yes   Up to date of Bone Density/Dexa? Yes Colorectal: colonoscopy 01/14/18     Plan:  I have personally reviewed and addressed the Medicare Annual Wellness questionnaire and have noted the following in the patient's chart:  A. Medical and social history B. Use of alcohol, tobacco or illicit drugs  C. Current medications and supplements D. Functional ability and status E.  Nutritional status F.  Physical activity G. Advance directives H. List of other physicians I.  Hospitalizations, surgeries, and ER visits in previous 12 months J.  [place] such as hearing and vision  if needed, cognitive and depression L. Referrals, records requested, and appointments- none   In addition, I have reviewed and discussed with patient certain  preventive protocols, quality metrics, and best practice recommendations. A written personalized care plan for preventive services as well as general preventive health recommendations were provided to patient.   Signed,  Denman George, LPN  Nurse Health Advisor   Nurse Notes: no additional

## 2020-03-25 NOTE — Patient Instructions (Signed)
[name] , Thank you for taking time to come for your Medicare Wellness Visit. I appreciate your ongoing commitment to your health goals. Please review the following plan we discussed and let me know if I can assist you in the future.   Screening recommendations/referrals: Colorectal Screening: up to date; last colonoscopy 01/14/18 Mammogram: up to date; scheduled for June, last 05/05/19 Bone Density: up to date; last 05/05/19  Vision and Dental Exams: Recommended annual ophthalmology exams for early detection of glaucoma and other disorders of the eye Recommended annual dental exams for proper oral hygiene  Vaccinations: Influenza vaccine: completed 09/26/19 Pneumococcal vaccine: up to date; last 12/02/19 Tdap vaccine: up to date; last 04/16/17 Shingles vaccine: Shingrix completed  Covid vaccine:  Completed    Advanced directives: Please bring a copy of your POA (Power of [place]) and/or Living Will to your next appointment.  Goals:Recommend to drink at least 6-8 8oz glasses of water per day and consume a balanced diet rich in fresh fruits and vegetables.   Next appointment: Please schedule your Annual Wellness Visit with your Nurse Health Advisor in one year.  Preventive Care 84 Years and Older, Female Preventive care refers to lifestyle choices and visits with your health care provider that can promote health and wellness. What does preventive care include?  A yearly physical exam. This is also called an annual well check.  Dental exams once or twice a year.  Routine eye exams. Ask your health care provider how often you should have your eyes checked.  Personal lifestyle choices, including:  Daily care of your teeth and gums.  Regular physical activity.  Eating a healthy diet.  Avoiding tobacco and drug use.  Limiting alcohol use.  Practicing safe sex.  Taking low-dose aspirin every day if recommended by your health care provider.  Taking vitamin and mineral  supplements as recommended by your health care provider. What happens during an annual well check? The services and screenings done by your health care provider during your annual well check will depend on your age, overall health, lifestyle risk factors, and family history of disease. Counseling  Your health care provider may ask you questions about your:  Alcohol use.  Tobacco use.  Drug use.  Emotional well-being.  Home and relationship well-being.  Sexual activity.  Eating habits.  History of falls.  Memory and ability to understand (cognition).  Work and work Statistician.  Reproductive health. Screening  You may have the following tests or measurements:  Height, weight, and BMI.  Blood pressure.  Lipid and cholesterol levels. These may be checked every 5 years, or more frequently if you are over 63 years old.  Skin check.  Lung cancer screening. You may have this screening every year starting at age 50 if you have a 30-pack-year history of smoking and currently smoke or have quit within the past 15 years.  Fecal occult blood test (FOBT) of the stool. You may have this test every year starting at age 63.  Flexible sigmoidoscopy or colonoscopy. You may have a sigmoidoscopy every 5 years or a colonoscopy every 10 years starting at age 26.  Hepatitis C blood test.  Hepatitis B blood test.  Sexually transmitted disease (STD) testing.  Diabetes screening. This is done by checking your blood sugar (glucose) after you have not eaten for a while (fasting). You may have this done every 1-3 years.  Bone density scan. This is done to screen for osteoporosis. You may have this done starting at age  65.  Mammogram. This may be done every 1-2 years. Talk to your health care provider about how often you should have regular mammograms. Talk with your health care provider about your test results, treatment options, and if necessary, the need for more tests. Vaccines  Your  health care provider may recommend certain vaccines, such as:  Influenza vaccine. This is recommended every year.  Tetanus, diphtheria, and acellular pertussis (Tdap, Td) vaccine. You may need a Td booster every 10 years.  Zoster vaccine. You may need this after age 43.  Pneumococcal 13-valent conjugate (PCV13) vaccine. One dose is recommended after age 93.  Pneumococcal polysaccharide (PPSV23) vaccine. One dose is recommended after age 60. Talk to your health care provider about which screenings and vaccines you need and how often you need them. This information is not intended to replace advice given to you by your health care provider. Make sure you discuss any questions you have with your health care provider. Document Released: 12/17/2015 Document Revised: 08/09/2016 Document Reviewed: 09/21/2015 Elsevier Interactive Patient Education  2017 [employer] Prevention in the Home Falls can cause injuries. They can happen to people of all ages. There are many things you can do to make your home safe and to help prevent falls. What can I do on the outside of my home?  Regularly fix the edges of walkways and driveways and fix any cracks.  Remove anything that might make you trip as you walk through a door, such as a raised step or threshold.  Trim any bushes or trees on the path to your home.  Use bright outdoor lighting.  Clear any walking paths of anything that might make someone trip, such as rocks or tools.  Regularly check to see if handrails are loose or broken. Make sure that both sides of any steps have handrails.  Any raised decks and porches should have guardrails on the edges.  Have any leaves, snow, or ice cleared regularly.  Use sand or salt on walking paths during winter.  Clean up any spills in your garage right away. This includes oil or grease spills. What can I do in the bathroom?  Use night lights.  Install grab bars by the toilet and in the tub and  shower. Do not use towel bars as grab bars.  Use non-skid mats or decals in the tub or shower.  If you need to sit down in the shower, use a plastic, non-slip stool.  Keep the floor dry. Clean up any water that spills on the floor as soon as it happens.  Remove soap buildup in the tub or shower regularly.  Attach bath mats securely with double-sided non-slip rug tape.  Do not have throw rugs and other things on the floor that can make you trip. What can I do in the bedroom?  Use night lights.  Make sure that you have a light by your bed that is easy to reach.  Do not use any sheets or blankets that are too big for your bed. They should not hang down onto the floor.  Have a firm chair that has side arms. You can use this for support while you get dressed.  Do not have throw rugs and other things on the floor that can make you trip. What can I do in the kitchen?  Clean up any spills right away.  Avoid walking on wet floors.  Keep items that you use a lot in easy-to-reach places.  If you need  to reach something above you, use a strong step stool that has a grab bar.  Keep electrical cords out of the way.  Do not use floor polish or wax that makes floors slippery. If you must use wax, use non-skid floor wax.  Do not have throw rugs and other things on the floor that can make you trip. What can I do with my stairs?  Do not leave any items on the stairs.  Make sure that there are handrails on both sides of the stairs and use them. Fix handrails that are broken or loose. Make sure that handrails are as long as the stairways.  Check any carpeting to make sure that it is firmly attached to the stairs. Fix any carpet that is loose or worn.  Avoid having throw rugs at the top or bottom of the stairs. If you do have throw rugs, attach them to the floor with carpet tape.  Make sure that you have a light switch at the top of the stairs and the bottom of the stairs. If you do not  have them, ask someone to add them for you. What else can I do to help prevent falls?  Wear shoes that:  Do not have high heels.  Have rubber bottoms.  Are comfortable and fit you well.  Are closed at the toe. Do not wear sandals.  If you use a stepladder:  Make sure that it is fully opened. Do not climb a closed stepladder.  Make sure that both sides of the stepladder are locked into place.  Ask someone to hold it for you, if possible.  Clearly mark and make sure that you can see:  Any grab bars or handrails.  First and last steps.  Where the edge of each step is.  Use tools that help you move around (mobility aids) if they are needed. These include:  Canes.  Walkers.  Scooters.  Crutches.  Turn on the lights when you go into a dark area. Replace any light bulbs as soon as they burn out.  Set up your furniture so you have a clear path. Avoid moving your furniture around.  If any of your floors are uneven, fix them.  If there are any pets around you, be aware of where they are.  Review your medicines with your doctor. Some medicines can make you feel dizzy. This can increase your chance of falling. Ask your doctor what other things that you can do to help prevent falls. This information is not intended to replace advice given to you by your health care provider. Make sure you discuss any questions you have with your health care provider. Document Released: 09/16/2009 Document Revised: 04/27/2016 Document Reviewed: 12/25/2014 Elsevier Interactive Patient Education  2017 [employer].

## 2020-03-25 NOTE — Progress Notes (Signed)
Pravastatin refilled per patient request

## 2020-05-04 ENCOUNTER — Other Ambulatory Visit: Payer: Self-pay

## 2020-05-05 ENCOUNTER — Ambulatory Visit: Payer: Medicare PPO | Admitting: Family Medicine

## 2020-05-05 ENCOUNTER — Encounter: Payer: Self-pay | Admitting: Family Medicine

## 2020-05-05 ENCOUNTER — Ambulatory Visit
Admission: RE | Admit: 2020-05-05 | Discharge: 2020-05-05 | Disposition: A | Discharge status: Home / Self Care | Payer: Medicare PPO | Source: Ambulatory Visit | Attending: Family Medicine | Admitting: Family Medicine

## 2020-05-05 VITALS — BP 118/60 | HR 82 | Temp 98.3°F | Ht 64.0 in | Wt 144.4 lb

## 2020-05-05 DIAGNOSIS — I10 Essential (primary) hypertension: Secondary | ICD-10-CM

## 2020-05-05 DIAGNOSIS — Z1231 Encounter for screening mammogram for malignant neoplasm of breast: Secondary | ICD-10-CM

## 2020-05-05 DIAGNOSIS — M5431 Sciatica, right side: Secondary | ICD-10-CM | POA: Diagnosis not present

## 2020-05-05 NOTE — Progress Notes (Signed)
Subjective  CC:  Chief Complaint  Patient presents with  . Hip Pain    Right; She states that this has become a constant pain.    HPI: Samantha Conley is a 69 y.o. female who presents to the office today to address the problems listed above in the chief complaint.  C/o right buttock pain worse with sitting at desk or driving. Started months ago but now occurring more frequently. No weakness, bowel or bladder dysfunction. Takes an occ tylenol. Prefers to NOT take medications unless pain is severe. No injury. Has known spinal DJD throughout. Just wants to be sure it is ok. Walks 2 miles a day and starts to hurt during the last quarter mile.  No weakness. Sleep is unaffected.   Hypertension f/u: Control is good . Pt reports she is doing well. taking medications as instructed, no medication side effects noted, no TIAs, no chest pain on exertion, no dyspnea on exertion, no swelling of ankles. She denies adverse effects from his BP medications. Compliance with medication is good.   BP Readings from Last 3 Encounters:  05/05/20 118/60  02/04/20 (!) 142/76  12/02/19 136/78   Wt Readings from Last 3 Encounters:  05/05/20 144 lb 6.4 oz (65.5 kg)  02/04/20 144 lb (65.3 kg)  12/02/19 139 lb (63 kg)    Lab Results  Component Value Date   CHOL 173 12/02/2019   CHOL 164 12/30/2018   CHOL 189 08/20/2018   Lab Results  Component Value Date   HDL 58.40 12/02/2019   HDL 51.90 12/30/2018   HDL 50 08/20/2018   Lab Results  Component Value Date   LDLCALC 90 12/02/2019   LDLCALC 89 12/30/2018   LDLCALC 106 (H) 08/20/2018   Lab Results  Component Value Date   TRIG 123.0 12/02/2019   TRIG 116.0 12/30/2018   TRIG 163 (H) 08/20/2018   Lab Results  Component Value Date   CHOLHDL 3 12/02/2019   CHOLHDL 3 12/30/2018   CHOLHDL 3.8 08/20/2018   No results found for: LDLDIRECT Lab Results  Component Value Date   CREATININE 1.46 (H) 12/02/2019   BUN 17 12/02/2019   NA 141 12/02/2019   K 4.3 12/02/2019   CL 106 12/02/2019   CO2 29 12/02/2019    The 10-year ASCVD risk score Mikey Bussing DC Jr., et al., 2013) is: 5.4%   Values used to calculate the score:     Age: 63 years     Sex: Female     Is Non-Hispanic African American: No     Diabetic: No     Tobacco smoker: No     Systolic Blood Pressure: 542 mmHg     Is BP treated: No     HDL Cholesterol: 58.4 mg/dL     Total Cholesterol: 173 mg/dL    Assessment  1. Right sided sciatica   2. Essential hypertension      Plan   Sciatica, right:  Reassured. rec stretching, strengthening, and f/u if worsens can consider PT. Pt declines nsaids or steroids at this time.   HTN is well controlled.   Follow up: 3-6 months. Sooner if needed.   Visit date not found  No orders of the defined types were placed in this encounter.  No orders of the defined types were placed in this encounter.     I reviewed the patients updated PMH, FH, and SocHx.    Patient Active Problem List   Diagnosis Date Noted  . Mixed hyperlipidemia 04/27/2017  Priority: High  . CRF (chronic renal failure), stage 3 (moderate) 04/20/2017    Priority: High  . Bipolar disease, chronic ([place]) 03/14/2014    Priority: High  . Essential hypertension 12/14/2011    Priority: High  . Osteopenia 05/06/2019    Priority: Medium  . Laryngopharyngeal reflux (LPR) 10/25/2016    Priority: Medium  . Vitamin D deficiency 04/27/2017    Priority: Low  . Gout 12/14/2011    Priority: Low  . 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 Osteoarthritis - DJD cervical, thoracic, lumbar 02/24/2020   Current Meds  Medication Sig  . acetaminophen (TYLENOL) 500 MG tablet Take 500 mg by mouth every 6 (six) hours as needed for mild pain.  Marland Kitchen allopurinol (ZYLOPRIM) 300 MG tablet Take 1 tablet (300 mg total) by mouth at bedtime.  . citalopram (CELEXA) 10 MG tablet Take 5 mg by mouth daily.  . divalproex (DEPAKOTE ER) 250 MG 24 hr tablet Take 250 mg by mouth 2 (two) times daily.  Marland Kitchen  doxepin (SINEQUAN) 50 MG capsule Take 50 mg by mouth at bedtime.  . polyethylene glycol (MIRALAX / GLYCOLAX) packet Take 17 g by mouth daily as needed for mild constipation.  . pravastatin (PRAVACHOL) 10 MG tablet Take 1 tablet (10 mg total) by mouth daily.    Allergies: Patient is allergic to lidocaine; benadryl [diphenhydramine hcl]; codeine; cortisone; doxycycline; morphine and related; promethazine; and rosuvastatin. Family History: Patient family history includes CAD in her father and mother; CVA in her mother; Cancer in her brother, brother, father, and paternal grandmother; Heart disease in her father, maternal grandfather, mother, and paternal grandfather; Hyperlipidemia in her mother; Hypertension in her mother; Parkinsonism in her father; Stroke in her maternal grandmother and mother. Social History:  Patient  reports that she has never smoked. She has never used smokeless tobacco. She reports that she does not drink alcohol or use drugs.  Review of Systems: Constitutional: Negative for fever malaise or anorexia Cardiovascular: negative for chest pain Respiratory: negative for SOB or persistent cough Gastrointestinal: negative for abdominal pain  Objective  Vitals: BP 118/60   Pulse 82   Temp 98.3 F (36.8 C) (Temporal)   Ht 5\' 4"  (1.626 m)   Wt 144 lb 6.4 oz (65.5 kg)   SpO2 98%   BMI 24.79 kg/m  General: no acute distress , A&Ox3 HEENT: PEERL, conjunctiva normal, neck is supple Cardiovascular:  RRR without murmur or gallop.  Respiratory:  Good breath sounds bilaterally, CTAB with normal respiratory effort Skin:  Warm, no rashes Back: right sciatic notch ttp, FROM, able to glut bridge but only briefly. No spinal ttp. Neg SLR bilaterally. Nl LE strength BLE     Commons side effects, risks, benefits, and alternatives for medications and treatment plan prescribed today were discussed, and the patient expressed understanding of the given instructions. Patient is  instructed to call or message via MyChart if he/she has any questions or concerns regarding our treatment plan. No barriers to understanding were identified. We discussed Red Flag symptoms and signs in detail. Patient expressed understanding regarding what to do in case of urgent or emergency type symptoms.   Medication list was reconciled, printed and provided to the patient in AVS. Patient instructions and summary information was reviewed with the patient as documented in the AVS. This note was prepared with assistance of Dragon voice recognition software. Occasional wrong-word or sound-a-like substitutions may have occurred due to the inherent limitations of voice recognition software  This visit occurred during  the SARS-CoV-2 public health emergency.  Safety protocols were in place, including screening questions prior to the visit, additional usage of staff PPE, and extensive cleaning of exam room while observing appropriate contact time as indicated for disinfecting solutions.

## 2020-05-05 NOTE — Patient Instructions (Signed)
Please return in 3-6 months for recheck.   If you have any questions or concerns, please don't hesitate to send me a message via MyChart or call the office at [phone]. Thank you for visiting with [place] today! It's our pleasure caring for you.   Sciatica  Sciatica is pain, numbness, weakness, or tingling along the path of the sciatic nerve. The sciatic nerve starts in the lower back and runs down the back of each leg. The nerve controls the muscles in the lower leg and in the back of the knee. It also provides feeling (sensation) to the back of the thigh, the lower leg, and the sole of the foot. Sciatica is a symptom of another medical condition that pinches or puts pressure on the sciatic nerve. Sciatica most often only affects one side of the body. Sciatica usually goes away on its own or with treatment. In some cases, sciatica may come back (recur). What are the causes? This condition is caused by pressure on the sciatic nerve or pinching of the nerve. This may be the result of:  A disk in between the bones of the spine bulging out too far (herniated disk).  Age-related changes in the spinal disks.  A pain disorder that affects a muscle in the buttock.  Extra bone growth near the sciatic nerve.  A break (fracture) of the pelvis.  Pregnancy.  Tumor. This is rare. What increases the risk? The following factors may make you more likely to develop this condition:  Playing sports that place pressure or stress on the spine.  Having poor strength and flexibility.  A history of back injury or surgery.  Sitting for long periods of time.  Doing activities that involve repetitive bending or lifting.  Obesity. What are the signs or symptoms? Symptoms can vary from mild to very severe, and they may include:  Any of these problems in the lower back, leg, hip, or buttock: ? Mild tingling, numbness, or dull aches. ? Burning sensations. ? Sharp pains.  Numbness in the back of the  calf or the sole of the foot.  Leg weakness.  Severe back pain that makes movement difficult. Symptoms may get worse when you cough, sneeze, or laugh, or when you sit or stand for long periods of time. How is this diagnosed? This condition may be diagnosed based on:  Your symptoms and medical history.  A physical exam.  Blood tests.  Imaging tests, such as: ? X-rays. ? MRI. ? CT scan. How is this treated? In many cases, this condition improves on its own without treatment. However, treatment may include:  Reducing or modifying physical activity.  Exercising and stretching.  Icing and applying heat to the affected area.  Medicines that help to: ? Relieve pain and swelling. ? Relax your muscles.  Injections of medicines that help to relieve pain, irritation, and inflammation around the sciatic nerve (steroids).  Surgery. Follow these instructions at home: Medicines  Take over-the-counter and prescription medicines only as told by your health care provider.  Ask your health care provider if the medicine prescribed to you: ? Requires you to avoid driving or using heavy machinery. ? Can cause constipation. You may need to take these actions to prevent or treat constipation:  Drink enough fluid to keep your urine pale yellow.  Take over-the-counter or prescription medicines.  Eat foods that are high in fiber, such as beans, whole grains, and fresh fruits and vegetables.  Limit foods that are high in fat and processed  sugars, such as fried or sweet foods. Managing pain      If directed, put ice on the affected area. ? Put ice in a plastic bag. ? Place a towel between your skin and the bag. ? Leave the ice on for 20 minutes, 2-3 times a day.  If directed, apply heat to the affected area. Use the heat source that your health care provider recommends, such as a moist heat pack or a heating pad. ? Place a towel between your skin and the heat source. ? Leave the  heat on for 20-30 minutes. ? Remove the heat if your skin turns bright red. This is especially important if you are unable to feel pain, heat, or cold. You may have a greater risk of getting burned. Activity   Return to your normal activities as told by your health care provider. Ask your health care provider what activities are safe for you.  Avoid activities that make your symptoms worse.  Take brief periods of rest throughout the day. ? When you rest for longer periods, mix in some mild activity or stretching between periods of rest. This will help to prevent stiffness and pain. ? Avoid sitting for long periods of time without moving. Get up and move around at least one time each hour.  Exercise and stretch regularly, as told by your health care provider.  Do not lift anything that is heavier than 10 lb (4.5 kg) while you have symptoms of sciatica. When you do not have symptoms, you should still avoid heavy lifting, especially repetitive heavy lifting.  When you lift objects, always use proper lifting technique, which includes: ? Bending your knees. ? Keeping the load close to your body. ? Avoiding twisting. General instructions  Maintain a healthy weight. Excess weight puts extra stress on your back.  Wear supportive, comfortable shoes. Avoid wearing high heels.  Avoid sleeping on a mattress that is too soft or too hard. A mattress that is firm enough to support your back when you sleep may help to reduce your pain.  Keep all follow-up visits as told by your health care provider. This is important. Contact a health care provider if:  You have pain that: ? Wakes you up when you are sleeping. ? Gets worse when you lie down. ? Is worse than you have experienced in the past. ? Lasts longer than 4 weeks.  You have an unexplained weight loss. Get help right away if:  You are not able to control when you urinate or have bowel movements (incontinence).  You have: ? Weakness in  your lower back, pelvis, buttocks, or legs that gets worse. ? Redness or swelling of your back. ? A burning sensation when you urinate. Summary  Sciatica is pain, numbness, weakness, or tingling along the path of the sciatic nerve.  This condition is caused by pressure on the sciatic nerve or pinching of the nerve.  Sciatica can cause pain, numbness, or tingling in the lower back, legs, hips, and buttocks.  Treatment often includes rest, exercise, medicines, and applying ice or heat. This information is not intended to replace advice given to you by your health care provider. Make sure you discuss any questions you have with your health care provider. Document Revised: 12/09/2018 Document Reviewed: 12/09/2018 Elsevier Patient Education  [place].   Back Exercises The following exercises strengthen the muscles that help to support the trunk and back. They also help to keep the lower back flexible. Doing these exercises  can help to prevent back pain or lessen existing pain.  If you have back pain or discomfort, try doing these exercises 2-3 times each day or as told by your health care provider.  As your pain improves, do them once each day, but increase the number of times that you repeat the steps for each exercise (do more repetitions).  To prevent the recurrence of back pain, continue to do these exercises once each day or as told by your health care provider. Do exercises exactly as told by your health care provider and adjust them as directed. It is normal to feel mild stretching, pulling, tightness, or discomfort as you do these exercises, but you should stop right away if you feel sudden pain or your pain gets worse. Exercises Single knee to chest Repeat these steps 3-5 times for each leg: 1. Lie on your back on a firm bed or the floor with your legs extended. 2. Bring one knee to your chest. Your other leg should stay extended and in contact with the floor. 3. Hold  your knee in place by grabbing your knee or thigh with both hands and hold. 4. Pull on your knee until you feel a gentle stretch in your lower back or buttocks. 5. Hold the stretch for 10-30 seconds. 6. Slowly release and straighten your leg. Pelvic tilt Repeat these steps 5-10 times: 1. Lie on your back on a firm bed or the floor with your legs extended. 2. Bend your knees so they are pointing toward the ceiling and your feet are flat on the floor. 3. Tighten your lower abdominal muscles to press your lower back against the floor. This motion will tilt your pelvis so your tailbone points up toward the ceiling instead of pointing to your feet or the floor. 4. With gentle tension and even breathing, hold this position for 5-10 seconds. Cat-cow Repeat these steps until your lower back becomes more flexible: 1. Get into a hands-and-knees position on a firm surface. Keep your hands under your shoulders, and keep your knees under your hips. You may place padding under your knees for comfort. 2. Let your head hang down toward your chest. Contract your abdominal muscles and point your tailbone toward the floor so your lower back becomes rounded like the back of a cat. 3. Hold this position for 5 seconds. 4. Slowly lift your head, let your abdominal muscles relax and point your tailbone up toward the ceiling so your back forms a sagging arch like the back of a cow. 5. Hold this position for 5 seconds.  Press-ups Repeat these steps 5-10 times: 1. Lie on your abdomen (face-down) on the floor. 2. Place your palms near your head, about shoulder-width apart. 3. Keeping your back as relaxed as possible and keeping your hips on the floor, slowly straighten your arms to raise the top half of your body and lift your shoulders. Do not use your back muscles to raise your upper torso. You may adjust the placement of your hands to make yourself more comfortable. 4. Hold this position for 5 seconds while you keep  your back relaxed. 5. Slowly return to lying flat on the floor.  Bridges Repeat these steps 10 times: 1. Lie on your back on a firm surface. 2. Bend your knees so they are pointing toward the ceiling and your feet are flat on the floor. Your arms should be flat at your sides, next to your body. 3. Tighten your buttocks muscles and lift your  buttocks off the floor until your waist is at almost the same height as your knees. You should feel the muscles working in your buttocks and the back of your thighs. If you do not feel these muscles, slide your feet 1-2 inches farther away from your buttocks. 4. Hold this position for 3-5 seconds. 5. Slowly lower your hips to the starting position, and allow your buttocks muscles to relax completely. If this exercise is too easy, try doing it with your arms crossed over your chest. Abdominal crunches Repeat these steps 5-10 times: 1. Lie on your back on a firm bed or the floor with your legs extended. 2. Bend your knees so they are pointing toward the ceiling and your feet are flat on the floor. 3. Cross your arms over your chest. 4. Tip your chin slightly toward your chest without bending your neck. 5. Tighten your abdominal muscles and slowly raise your trunk (torso) high enough to lift your shoulder blades a tiny bit off the floor. Avoid raising your torso higher than that because it can put too much stress on your low back and does not help to strengthen your abdominal muscles. 6. Slowly return to your starting position. Back lifts Repeat these steps 5-10 times: 1. Lie on your abdomen (face-down) with your arms at your sides, and rest your forehead on the floor. 2. Tighten the muscles in your legs and your buttocks. 3. Slowly lift your chest off the floor while you keep your hips pressed to the floor. Keep the back of your head in line with the curve in your back. Your eyes should be looking at the floor. 4. Hold this position for 3-5  seconds. 5. Slowly return to your starting position. Contact a health care provider if:  Your back pain or discomfort gets much worse when you do an exercise.  Your worsening back pain or discomfort does not lessen within 2 hours after you exercise. If you have any of these problems, stop doing these exercises right away. Do not do them again unless your health care provider says that you can. Get help right away if:  You develop sudden, severe back pain. If this happens, stop doing the exercises right away. Do not do them again unless your health care provider says that you can. This information is not intended to replace advice given to you by your health care provider. Make sure you discuss any questions you have with your health care provider. Document Revised: 03/27/2019 Document Reviewed: 08/22/2018 Elsevier Patient Education  [place].

## 2020-05-20 ENCOUNTER — Ambulatory Visit: Payer: Medicare PPO | Admitting: Family Medicine

## 2020-06-03 ENCOUNTER — Telehealth: Payer: Self-pay | Admitting: Family Medicine

## 2020-06-03 NOTE — Telephone Encounter (Signed)
Patient is upset b/c [facility] is stating that Jonni Sanger is out of network.  She is received a letter as well.   Have you heard anything from credentialing.

## 2020-06-22 DIAGNOSIS — F319 Bipolar disorder, unspecified: Secondary | ICD-10-CM | POA: Diagnosis not present

## 2020-06-22 DIAGNOSIS — G4700 Insomnia, unspecified: Secondary | ICD-10-CM | POA: Diagnosis not present

## 2020-08-10 ENCOUNTER — Telehealth: Payer: Self-pay | Admitting: Family Medicine

## 2020-08-10 ENCOUNTER — Telehealth: Payer: Self-pay | Admitting: Interventional Cardiology

## 2020-08-10 DIAGNOSIS — E7849 Other hyperlipidemia: Secondary | ICD-10-CM

## 2020-08-10 NOTE — Telephone Encounter (Signed)
Patient is wanting to have labs drawn 12/20/2020 to be specific. No lab orders. I did advise her that she has not been scheduled to see Dr. Tamala Julian because she is not due until 01/2021. But she is insisting on having labs scheduled 12/20/2020 so she does not have to miss any days from work. Please contact patient.

## 2020-08-10 NOTE — Telephone Encounter (Signed)
New Message   Patient sent a chart message in reference to labs for her yearly visit. I did advise that Dr. Tamala Julian has not ordered any labs and that he may want to wait to see her before labs are requested. Please advise.

## 2020-08-10 NOTE — Telephone Encounter (Signed)
Patient is requesting a CPE with labs for 9/17.    I informed patient that CPE would be into next year.  Patient then no longer wanted to schedule CPE.   Patient then wanted to schedule an appt with Dr. Jonni Sanger for a follow up on back issues.    Patient is still requesting "routine labs" to be completed for cardiology on 9/17 at our lab.  Please advise.

## 2020-08-10 NOTE — Telephone Encounter (Signed)
Left detailed message on pt's confirmed VM letting her know that she had labs drawn in Dec, so no need to redraw at this time.  Advised to call back if any questions.

## 2020-08-10 NOTE — Addendum Note (Signed)
Addended by: Loren Racer on: 08/10/2020 03:20 PM   Modules accepted: Orders

## 2020-08-10 NOTE — Telephone Encounter (Signed)
Spoke with pt and made her aware that I will place order for liver and lipid panel.  Advised if PCP draws labs before [place], just contact the office to cancel our labs.  Pt appreciative for call.

## 2020-08-10 NOTE — Telephone Encounter (Signed)
FYI

## 2020-08-11 NOTE — Telephone Encounter (Signed)
LVM asking patient to call the office back.

## 2020-08-11 NOTE — Telephone Encounter (Signed)
We can discuss what is needed at office visit. I should be able to help with labs at that time.

## 2020-08-11 NOTE — Telephone Encounter (Signed)
Please call for appointment

## 2020-09-21 ENCOUNTER — Telehealth: Payer: Self-pay

## 2020-09-21 DIAGNOSIS — M1000 Idiopathic gout, unspecified site: Secondary | ICD-10-CM

## 2020-09-21 DIAGNOSIS — N183 Chronic kidney disease, stage 3 unspecified: Secondary | ICD-10-CM

## 2020-09-21 DIAGNOSIS — E559 Vitamin D deficiency, unspecified: Secondary | ICD-10-CM

## 2020-09-21 DIAGNOSIS — E782 Mixed hyperlipidemia: Secondary | ICD-10-CM

## 2020-09-21 DIAGNOSIS — I10 Essential (primary) hypertension: Secondary | ICD-10-CM

## 2020-09-21 NOTE — Telephone Encounter (Signed)
patient is requesting blood work and then appt.  I asked her like physical and she stated dr.andy knows she does not like physicals so she wants an appointment after her blood work so they can discuss results.  Please advise

## 2020-09-22 NOTE — Telephone Encounter (Signed)
Future labs ordered. Please schedule fasting lab appt for pt several day before her appt. Thanks.

## 2020-09-24 NOTE — Telephone Encounter (Signed)
LVM for patient to call back and schedule appointments

## 2020-11-15 ENCOUNTER — Telehealth: Payer: Self-pay

## 2020-11-15 NOTE — Telephone Encounter (Signed)
Patient is scheduled for Friday.  Nurse Assessment Nurse: Rolin Barry, RN, Levada Dy Date/Time Eilene Ghazi Time): 11/15/2020 3:38:38 PM Confirm and document reason for call. If symptomatic, describe symptoms. ---Caller is having left leg calf pain that will not go away. Advised that it is not getting better. Has been going on x 3 weeks. No temp. No known injury, no falls. No discoloration. Pain 4/10. Caller has taken tylenol. Does the patient have any new or worsening symptoms? ---Yes Will a triage be completed? ---Yes Related visit to physician within the last 2 weeks? ---No Does the PT have any chronic conditions? (i.e. diabetes, asthma, this includes High risk factors for pregnancy, etc.) ---Yes List chronic conditions. ---kidney disease cholesterol enlarged heart bipolar Is this a behavioral health or substance abuse call? ---No Guidelines Guideline Title Affirmed Question Affirmed Notes Nurse Date/Time Eilene Ghazi Time) Leg Pain [1] Thigh or calf pain AND [2] only 1 side AND [3] present > 1 hour (Exception: chronic unchanged pain) Deaton, RN, Levada Dy 11/15/2020 3:41:10 PM Disp. Time Eilene Ghazi Time) Disposition Final User 11/15/2020 3:48:32 PM See HCP within 4 Hours (or PCP triage) Yes Deaton, RN, Levada Dy PLEASE NOTE: All timestamps contained within this report are represented as [place] Standard Time. CONFIDENTIALTY NOTICE: This fax transmission is intended only for the addressee. It contains information that is legally privileged, confidential or otherwise protected from use or disclosure. If you are not the intended recipient, you are strictly prohibited from reviewing, disclosing, copying using or disseminating any of this information or taking any action in reliance on or regarding this information. If you have received this fax in error, please notify us immediately by telephone so that we can arrange for its return to [place]. Phone: [phone], Toll-Free: [phone], Fax: [phone] Page:  2 of 2 Call Id: [id number] [place] Disagree/Comply Disagree Caller Understands Yes PreDisposition Did not know what to do Care Advice Given Per Guideline SEE HCP (OR PCP TRIAGE) WITHIN 4 HOURS: * IF OFFICE WILL BE OPEN: You need to be seen within the next 3 or 4 hours. Call your doctor (or NP/PA) now or as soon as the office opens. CARE ADVICE given per Leg Pain (Adult) guideline. * You become worse CALL BACK IF: CALL EMS IF: * You develop any chest pain or shortness of breath. Comments User: Saverio Danker, RN Date/Time Eilene Ghazi Time): 11/15/2020 3:49:54 PM Caller advised that she does not feel that she is in any danger. Will not go to UC or the ED. Warm transferred caller with Danielle at the back line for further assistance. Referrals Warm transfer to backlin

## 2020-11-16 NOTE — Telephone Encounter (Signed)
FYI, patient scheduled for 12/17

## 2020-11-19 ENCOUNTER — Encounter: Payer: Self-pay | Admitting: Family Medicine

## 2020-11-19 ENCOUNTER — Other Ambulatory Visit: Payer: Self-pay

## 2020-11-19 ENCOUNTER — Ambulatory Visit: Payer: Medicare PPO | Admitting: Family Medicine

## 2020-11-19 VITALS — BP 138/70 | HR 98 | Temp 98.3°F | Ht 64.0 in | Wt 143.2 lb

## 2020-11-19 DIAGNOSIS — M79662 Pain in left lower leg: Secondary | ICD-10-CM

## 2020-11-19 DIAGNOSIS — Z780 Asymptomatic menopausal state: Secondary | ICD-10-CM | POA: Diagnosis not present

## 2020-11-19 DIAGNOSIS — Z1231 Encounter for screening mammogram for malignant neoplasm of breast: Secondary | ICD-10-CM | POA: Diagnosis not present

## 2020-11-19 DIAGNOSIS — M79661 Pain in right lower leg: Secondary | ICD-10-CM | POA: Diagnosis not present

## 2020-11-19 NOTE — Progress Notes (Signed)
Subjective  CC:  Chief Complaint  Patient presents with  . Leg Pain    Left shin, pain mostly at night. New job working on Pensions consultant    HPI: Samantha Conley is a 69 y.o. female who presents to the office today to address the problems listed above in the chief complaint.  Patient is now working in a new school, Production manager.  This is a much better job for her.  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  Is much easier on her body.  However she is on cement floors and is doing more walking.  She continues to walk for exercise as well.  She notices bilateral anterior lower extremity pain, worse with walking and worse on the left.  Dull and throbbing at night.  No injury.  No soft calf swelling or calf pain.  No knee pain.  She takes Tylenol and it does help relieve the symptoms.  This started about 2 to 3 weeks ago.  No heel pain.  No limp.  Health maintenance: Due mammogram and bone density in June 2022, she would like this order now so she can schedule. Assessment  1. Pain in both lower legs   2. Encounter for screening mammogram for breast cancer   3. Asymptomatic menopausal state      Plan   Bilateral leg pain, consistent with extensor tendon inflammation and/or shinsplints due to overuse: Education given.  Start ice, Tylenol twice daily, she cannot tolerate NSAIDs, and resting.  No walking for exercise recommended for 2 weeks.  Follow-up if not improving.  Bone density and mammogram ordered  Follow up: For complete physical, sooner if leg pain not improving 02/21/2021  Orders Placed This Encounter  Procedures  . MM DIGITAL SCREENING BILATERAL  . DG Bone Density   No orders of the defined types were placed in this encounter.     I reviewed the patients updated PMH, FH, and SocHx.    Patient Active Problem List   Diagnosis Date Noted  . Mixed hyperlipidemia 04/27/2017    Priority: High  . CRF (chronic renal failure), stage 3  (moderate) ([place]) 04/20/2017    Priority: High  . Bipolar disease, chronic ([place]) 03/14/2014    Priority: High  . Essential hypertension 12/14/2011    Priority: High  . Osteopenia 05/06/2019    Priority: Medium  . Laryngopharyngeal reflux (LPR) 10/25/2016    Priority: Medium  . Vitamin D deficiency 04/27/2017    Priority: Low  . Gout 12/14/2011    Priority: Low  . 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 Osteoarthritis - DJD cervical, thoracic, lumbar 02/24/2020   Current Meds  Medication Sig  . acetaminophen (TYLENOL) 500 MG tablet Take 500 mg by mouth every 6 (six) hours as needed for mild pain.  Marland Kitchen allopurinol (ZYLOPRIM) 300 MG tablet Take 1 tablet (300 mg total) by mouth at bedtime.  . citalopram (CELEXA) 10 MG tablet Take 5 mg by mouth daily.  . divalproex (DEPAKOTE ER) 250 MG 24 hr tablet Take 250 mg by mouth 2 (two) times daily.  Marland Kitchen doxepin (SINEQUAN) 50 MG capsule Take 50 mg by mouth at bedtime.  . polyethylene glycol (MIRALAX / GLYCOLAX) packet Take 17 g by mouth daily as needed for mild constipation.  . pravastatin (PRAVACHOL) 10 MG tablet Take 1 tablet (10 mg total) by mouth daily.    Allergies: Patient is allergic to lidocaine, benadryl [diphenhydramine hcl], codeine, cortisone, doxycycline,  morphine and related, promethazine, and rosuvastatin. Family History: Patient family history includes CAD in her father and mother; CVA in her mother; Cancer in her brother, brother, father, and paternal grandmother; Heart disease in her father, maternal grandfather, mother, and paternal grandfather; Hyperlipidemia in her mother; Hypertension in her mother; Parkinsonism in her father; Stroke in her maternal grandmother and mother. Social History:  Patient  reports that she has never smoked. She has never used smokeless tobacco. She reports that she does not drink alcohol and does not use drugs.  Review of Systems: Constitutional: Negative for fever malaise or anorexia Cardiovascular:  negative for chest pain Respiratory: negative for SOB or persistent cough Gastrointestinal: negative for abdominal pain  Objective  Vitals: BP 138/70   Pulse 98   Temp 98.3 F (36.8 C) (Temporal)   Ht 5\' 4"  (1.626 m)   Wt 143 lb 3.2 oz (65 kg)   SpO2 98%   BMI 24.58 kg/m  General: no acute distress , A&Ox3 Appears well, normal gait Bilateral lower extremities appear normal, no swelling, no calf tenderness, no cords, no tenderness with palpation, no masses palpated.  She is able to walk on her heels and toes but has some anterior leg pain with walking on heels.     Commons side effects, risks, benefits, and alternatives for medications and treatment plan prescribed today were discussed, and the patient expressed understanding of the given instructions. Patient is instructed to call or message via MyChart if he/she has any questions or concerns regarding our treatment plan. No barriers to understanding were identified. We discussed Red Flag symptoms and signs in detail. Patient expressed understanding regarding what to do in case of urgent or emergency type symptoms.   Medication list was reconciled, printed and provided to the patient in AVS. Patient instructions and summary information was reviewed with the patient as documented in the AVS. This note was prepared with assistance of Dragon voice recognition software. Occasional wrong-word or sound-a-like substitutions may have occurred due to the inherent limitations of voice recognition software  This visit occurred during the SARS-CoV-2 public health emergency.  Safety protocols were in place, including screening questions prior to the visit, additional usage of staff PPE, and extensive cleaning of exam room while observing appropriate contact time as indicated for disinfecting solutions.

## 2020-11-19 NOTE — Patient Instructions (Addendum)
Please follow up as scheduled for your next visit with me: 02/28/2021   Ice your shins twice a day for several days. Take tylenol ES twice a day for two weeks.  Rest! No extra walking for exercise for 2 weeks.   If you have any questions or concerns, please don't hesitate to send me a message via MyChart or call the office at [phone]. Thank you for visiting with [place] today! It's our pleasure caring for you.  I have ordered a mammogram and/or bone density for you as we discussed today: [x]   Mammogram  [x]   Bone Density  Please call the office checked below to schedule your appointment: Your appointment will at the following location  [x]   [facility]      [place], [place]         []   [facility]  [address] [place], [place]

## 2020-11-25 ENCOUNTER — Ambulatory Visit: Payer: Medicare PPO | Attending: Internal Medicine

## 2020-11-25 DIAGNOSIS — Z23 Encounter for immunization: Secondary | ICD-10-CM

## 2020-11-25 NOTE — Progress Notes (Signed)
   Covid-19 Vaccination Clinic  Name:  MEHEK GREGA    MRN: [id number] DOB: 10/02/1951  11/25/2020  Ms. Lamoreaux was observed post Covid-19 immunization for 30 minutes based on pre-vaccination screening without incident. She was provided with Vaccine Information Sheet and instruction to access the V-Safe system.   Ms. Nodine was instructed to call 911 with any severe reactions post vaccine: Marland Kitchen Difficulty breathing  . Swelling of face and throat  . A fast heartbeat  . A bad rash all over body  . Dizziness and weakness   Immunizations Administered    Name Date Dose VIS Date Route   Pfizer COVID-19 Vaccine 11/25/2020  1:26 PM 0.3 mL 09/22/2020 Intramuscular   Manufacturer: [place]   Lot: [id number]   [place]: [id number]

## 2020-12-06 ENCOUNTER — Other Ambulatory Visit: Payer: Self-pay | Admitting: *Deleted

## 2020-12-06 MED ORDER — PRAVASTATIN SODIUM 10 MG PO TABS: 10.0000 mg | ORAL_TABLET | ORAL | 2 refills | Status: DC

## 2020-12-15 ENCOUNTER — Encounter: Payer: Self-pay | Admitting: Family Medicine

## 2020-12-15 DIAGNOSIS — F319 Bipolar disorder, unspecified: Secondary | ICD-10-CM | POA: Diagnosis not present

## 2020-12-15 DIAGNOSIS — G4700 Insomnia, unspecified: Secondary | ICD-10-CM | POA: Diagnosis not present

## 2020-12-16 ENCOUNTER — Other Ambulatory Visit: Payer: Self-pay

## 2020-12-16 DIAGNOSIS — M1000 Idiopathic gout, unspecified site: Secondary | ICD-10-CM

## 2020-12-16 MED ORDER — ALLOPURINOL 300 MG PO TABS: 300.0000 mg | ORAL_TABLET | Freq: Every day | ORAL | 3 refills | Status: DC

## 2020-12-20 ENCOUNTER — Other Ambulatory Visit: Payer: Medicare PPO

## 2020-12-23 ENCOUNTER — Other Ambulatory Visit: Payer: Medicare PPO

## 2021-01-11 DIAGNOSIS — H26491 Other secondary cataract, right eye: Secondary | ICD-10-CM | POA: Diagnosis not present

## 2021-01-11 DIAGNOSIS — H43813 Vitreous degeneration, bilateral: Secondary | ICD-10-CM | POA: Diagnosis not present

## 2021-01-11 DIAGNOSIS — H0102A Squamous blepharitis right eye, upper and lower eyelids: Secondary | ICD-10-CM | POA: Diagnosis not present

## 2021-01-11 DIAGNOSIS — H16223 Keratoconjunctivitis sicca, not specified as Sjogren's, bilateral: Secondary | ICD-10-CM | POA: Diagnosis not present

## 2021-01-11 DIAGNOSIS — Z961 Presence of intraocular lens: Secondary | ICD-10-CM | POA: Diagnosis not present

## 2021-01-11 DIAGNOSIS — H0102B Squamous blepharitis left eye, upper and lower eyelids: Secondary | ICD-10-CM | POA: Diagnosis not present

## 2021-01-11 DIAGNOSIS — H1045 Other chronic allergic conjunctivitis: Secondary | ICD-10-CM | POA: Diagnosis not present

## 2021-01-24 ENCOUNTER — Other Ambulatory Visit: Payer: Medicare PPO | Admitting: *Deleted

## 2021-01-24 ENCOUNTER — Other Ambulatory Visit: Payer: Self-pay

## 2021-01-24 ENCOUNTER — Encounter: Payer: Self-pay | Admitting: Family Medicine

## 2021-01-24 DIAGNOSIS — E7849 Other hyperlipidemia: Secondary | ICD-10-CM | POA: Diagnosis not present

## 2021-01-24 LAB — LIPID PANEL
Chol/HDL Ratio: 3.4 ratio (ref 0.0–4.4)
Cholesterol, Total: 219 mg/dL — ABNORMAL HIGH (ref 100–199)
HDL: 64 mg/dL (ref >39)
LDL Chol Calc (NIH): 134 mg/dL — ABNORMAL HIGH (ref 0–99)
Triglycerides: 118 mg/dL (ref 0–149)
VLDL Cholesterol Cal: 21 mg/dL (ref 5–40)

## 2021-01-24 LAB — HEPATIC FUNCTION PANEL
ALT: 23 IU/L (ref 0–32)
AST: 26 IU/L (ref 0–40)
Albumin: 4.8 g/dL (ref 3.8–4.8)
Alkaline Phosphatase: 94 IU/L (ref 44–121)
Bilirubin Total: 1 mg/dL (ref 0.0–1.2)
Bilirubin, Direct: 0.2 mg/dL (ref 0.00–0.40)
Total Protein: 6.9 g/dL (ref 6.0–8.5)

## 2021-01-27 ENCOUNTER — Ambulatory Visit: Payer: Medicare PPO | Admitting: Interventional Cardiology

## 2021-01-28 ENCOUNTER — Ambulatory Visit: Payer: Medicare PPO | Admitting: Interventional Cardiology

## 2021-01-28 ENCOUNTER — Encounter: Payer: Self-pay | Admitting: Interventional Cardiology

## 2021-01-28 ENCOUNTER — Other Ambulatory Visit: Payer: Self-pay

## 2021-01-28 VITALS — BP 136/72 | HR 84 | Ht 64.0 in | Wt 143.8 lb

## 2021-01-28 DIAGNOSIS — Z7189 Other specified counseling: Secondary | ICD-10-CM

## 2021-01-28 DIAGNOSIS — I10 Essential (primary) hypertension: Secondary | ICD-10-CM | POA: Diagnosis not present

## 2021-01-28 DIAGNOSIS — E7849 Other hyperlipidemia: Secondary | ICD-10-CM | POA: Diagnosis not present

## 2021-01-28 DIAGNOSIS — I2510 Atherosclerotic heart disease of native coronary artery without angina pectoris: Secondary | ICD-10-CM

## 2021-01-28 DIAGNOSIS — N184 Chronic kidney disease, stage 4 (severe): Secondary | ICD-10-CM | POA: Diagnosis not present

## 2021-01-28 NOTE — Patient Instructions (Signed)
Medication Instructions:  Your physician recommends that you continue on your current medications as directed. Please refer to the Current Medication list given to you today. *If you need a refill on your cardiac medications before your next appointment, please call your pharmacy*   Lab Work: none If you have labs (blood work) drawn today and your tests are completely normal, you will receive your results only by: Marland Kitchen MyChart Message (if you have MyChart) OR . A paper copy in the mail If you have any lab test that is abnormal or we need to change your treatment, we will call you to review the results.   Testing/Procedures: none   Follow-Up: At [facility], you and your health needs are our priority.  As part of our continuing mission to provide you with exceptional heart care, we have created designated Provider Care Teams.  These Care Teams include your primary Cardiologist (physician) and Advanced Practice Providers (APPs -  Physician Assistants and Nurse Practitioners) who all work together to provide you with the care you need, when you need it.  We recommend signing up for the patient portal called "MyChart".  Sign up information is provided on this After Visit Summary.  MyChart is used to connect with patients for Virtual Visits (Telemedicine).  Patients are able to view lab/test results, encounter notes, upcoming appointments, etc.  Non-urgent messages can be sent to your provider as well.   To learn more about what you can do with MyChart, go to [email or web address].    Your next appointment:   12 month(s)  The format for your next appointment:   In Person  Provider:   You may see Sinclair Grooms, MD or one of the following Advanced Practice Providers on your designated Care Team:    Kathyrn Drown, NP

## 2021-01-28 NOTE — Progress Notes (Signed)
Cardiology Office Note:    Date:  01/28/2021   ID:  [name], DOB [birth year], MRN [id number]  PCP:  Leamon Arnt, MD  Cardiologist:  Sinclair Grooms, MD   Referring MD: Leamon Arnt, MD   Chief Complaint  Patient presents with   Coronary Artery Disease    Minimally elevated calcium score    History of Present Illness:    Samantha Conley is a 69 y.o. female with a hx of family history of vascular disease, hyperlipidemia (untreated), prior negative ischemic workup with stress echo 0000000, diastolic left ventricular dysfunction, and CKD 3.  Mildly abnormal coronary calcium score 2019, 24.  Has leg pain.  Been off statin therapy did not change the complaint.  No chest pain or other symptoms.  Past Medical History:  Diagnosis Date   Allergy    Anemia    Anxiety    Arthritis    back   Bipolar disorder ([place])    Bipolar disorder, unspecified ([place]) 04/19/2014   Cataract    chemical imbalance 12/14/2011   Chronic kidney disease (CKD), stage III (moderate) (HCC)    Dehydration 04/25/2014   Dehydration, severe 03/12/2014   Depression    Diarrhea 03/12/2014   Essential hypertension    GERD (gastroesophageal reflux disease)    Gout    HLD (hyperlipidemia) 04/27/2017   Hypercalcemia 03/12/2014   Insomnia    Lithium toxicity    Osteoarthritis - DJD cervical, thoracic, lumbar 02/24/2020   Osteopenia 05/06/2019   DEXA: 05/2019: lowest T = -2.3 at left femur. Recheck 2 years.     Past Surgical History:  Procedure Laterality Date   CATARACT EXTRACTION, BILATERAL     Lens implant bilaterally   CHOLECYSTECTOMY     COLONOSCOPY N/A 01/14/2018   NASAL SINUS SURGERY  2014   TONSILLECTOMY     TOTAL ABDOMINAL HYSTERECTOMY     endometriosis, bleeding    Current Medications: Current Meds  Medication Sig   acetaminophen (TYLENOL) 500 MG tablet Take 500 mg by mouth every 6 (six) hours as needed for mild pain.   allopurinol (ZYLOPRIM) 300 MG tablet  Take 1 tablet (300 mg total) by mouth at bedtime.   citalopram (CELEXA) 10 MG tablet Take 5 mg by mouth daily.   divalproex (DEPAKOTE ER) 250 MG 24 hr tablet Take 250 mg by mouth 2 (two) times daily.   doxepin (SINEQUAN) 50 MG capsule Take 50 mg by mouth at bedtime.   polyethylene glycol (MIRALAX / GLYCOLAX) packet Take 17 g by mouth daily as needed for mild constipation.   pravastatin (PRAVACHOL) 10 MG tablet Take 1 tablet (10 mg total) by mouth every Monday, Wednesday, and Friday.     Allergies:   Lidocaine, Benadryl [diphenhydramine hcl], Codeine, Cortisone, Doxycycline, Morphine and related, Promethazine, and Rosuvastatin   Social History   Socioeconomic History   Marital status: Single    Spouse name: Not on file   Number of children: 0   Years of education: 20   Highest education level: Not on file  Occupational History   Occupation: retired    Fish farm manager: [employer]    Comment: librarian  Tobacco Use   Smoking status: Never Smoker   Smokeless tobacco: Never Used  Scientific laboratory technician Use: Never used  Substance and Sexual Activity   Alcohol use: No    Alcohol/week: 0.0 standard drinks   Drug use: No   Sexual activity: Not Currently  Other Topics Concern  Not on file  Social History Narrative   Lives alone with two pets,   Never married   Two masters degrees   Worked for the state      Social Determinants of Radio broadcast assistant Strain: Not on file  Food Insecurity: Not on file  Transportation Needs: Not on file  Physical Activity: Not on file  Stress: Not on file  Social Connections: Not on file     Family History: The patient's family history includes CAD in her father and mother; CVA in her mother; Cancer in her brother, brother, father, and paternal grandmother; Heart disease in her father, maternal grandfather, mother, and paternal grandfather; Hyperlipidemia in her mother; Hypertension in her mother; Parkinsonism in her father;  Stroke in her maternal grandmother and mother.  ROS:   Please see the history of present illness.    Aching discomfort in her shin area bilaterally when she lies down at night.  No discomfort with activity or walking.  All other systems reviewed and are negative.  EKGs/Labs/Other Studies Reviewed:    The following studies were reviewed today: Coronary CT calcium score 2019  IMPRESSION: 1. Coronary artery calcium score 24 Agatston units, placing the patient in the 66th percentile for age and gender. This suggests intermediate risk for future cardiac events.  2. Plaque throughout the proximal LAD. Probably no more than mild stenosis but will send for FFR to confirm. EKG:  EKG normal sinus rhythm with low voltage and incomplete right bundle.  There is no change when compared to prior.  Recent Labs: 01/24/2021: ALT 23  Recent Lipid Panel    Component Value Date/Time   CHOL 219 (H) 01/24/2021 0948   TRIG 118 01/24/2021 0948   HDL 64 01/24/2021 0948   CHOLHDL 3.4 01/24/2021 0948   CHOLHDL 3 12/02/2019 1101   VLDL 24.6 12/02/2019 1101   LDLCALC 134 (H) 01/24/2021 0948    Physical Exam:    VS:  BP 136/72    Pulse 84    Ht '5\' 4"'$  (1.626 m)    Wt 143 lb 12.8 oz (65.2 kg)    SpO2 99%    BMI 24.68 kg/m     Wt Readings from Last 3 Encounters:  01/28/21 143 lb 12.8 oz (65.2 kg)  11/19/20 143 lb 3.2 oz (65 kg)  05/05/20 144 lb 6.4 oz (65.5 kg)     GEN: Mild obesity. No acute distress HEENT: Normal NECK: No JVD. LYMPHATICS: No lymphadenopathy CARDIAC: No murmur. RRR no gallop, or edema. VASCULAR:  Normal Pulses. No bruits. RESPIRATORY:  Clear to auscultation without rales, wheezing or rhonchi  ABDOMEN: Soft, non-tender, non-distended, No pulsatile mass, MUSCULOSKELETAL: No deformity  SKIN: Warm and dry NEUROLOGIC:  Alert and oriented x 3 PSYCHIATRIC:  Normal affect   ASSESSMENT:    1. Coronary artery calcification seen on CT scan   2. Other hyperlipidemia   3. CKD  (chronic kidney disease) stage 4, GFR 15-29 ml/min (HCC)   4. Essential hypertension   5. Educated about COVID-19 virus infection    PLAN:    In order of problems listed above:  1. Feels she has side effects to daily statin therapy.  Given calcium score that is near 0, continue low-dose pravastatin Monday, Wednesday, and Friday.  Increase physical activity.  Mediterranean diet. 2. Continue low intensity statin therapy 3. Not evaluated or discussed other than confirming that there is mild CKD. 4. Excellent current blood pressure control.  Recommended 2.5 g sodium diet and  weight loss.  Increase fruit and vegetables in diet. 5. Did not discuss  Plan 1 year follow-up  Overall education and awareness concerning primary risk prevention was discussed in detail: LDL less than 70, hemoglobin A1c less than 7, blood pressure target less than 130/80 mmHg, >150 minutes of moderate aerobic activity per week, avoidance of smoking, weight control (via diet and exercise), and continued surveillance/management of/for obstructive sleep apnea.    Medication Adjustments/Labs and Tests Ordered: Current medicines are reviewed at length with the patient today.  Concerns regarding medicines are outlined above.  Orders Placed This Encounter  Procedures   EKG 12-Lead   No orders of the defined types were placed in this encounter.   There are no Patient Instructions on file for this visit.   Signed, Sinclair Grooms, MD  01/28/2021 2:18 PM    [place]

## 2021-02-21 ENCOUNTER — Other Ambulatory Visit: Payer: Self-pay

## 2021-02-21 ENCOUNTER — Other Ambulatory Visit (INDEPENDENT_AMBULATORY_CARE_PROVIDER_SITE_OTHER): Payer: Medicare PPO

## 2021-02-21 DIAGNOSIS — N183 Chronic kidney disease, stage 3 unspecified: Secondary | ICD-10-CM

## 2021-02-21 DIAGNOSIS — I10 Essential (primary) hypertension: Secondary | ICD-10-CM | POA: Diagnosis not present

## 2021-02-21 DIAGNOSIS — E559 Vitamin D deficiency, unspecified: Secondary | ICD-10-CM | POA: Diagnosis not present

## 2021-02-21 DIAGNOSIS — M1000 Idiopathic gout, unspecified site: Secondary | ICD-10-CM

## 2021-02-21 DIAGNOSIS — E782 Mixed hyperlipidemia: Secondary | ICD-10-CM | POA: Diagnosis not present

## 2021-02-21 LAB — CBC WITH DIFFERENTIAL/PLATELET
Basophils Absolute: 0 10*3/uL (ref 0.0–0.1)
Basophils Relative: 0.9 % (ref 0.0–3.0)
Eosinophils Absolute: 0.1 10*3/uL (ref 0.0–0.7)
Eosinophils Relative: 3.4 % (ref 0.0–5.0)
HCT: 36.4 % (ref 36.0–46.0)
Hemoglobin: 12.2 g/dL (ref 12.0–15.0)
Lymphocytes Relative: 48.4 % — ABNORMAL HIGH (ref 12.0–46.0)
Lymphs Abs: 1.8 10*3/uL (ref 0.7–4.0)
MCHC: 33.4 g/dL (ref 30.0–36.0)
MCV: 92.2 fl (ref 78.0–100.0)
Monocytes Absolute: 0.2 10*3/uL (ref 0.1–1.0)
Monocytes Relative: 6.2 % (ref 3.0–12.0)
Neutro Abs: 1.5 10*3/uL (ref 1.4–7.7)
Neutrophils Relative %: 41.1 % — ABNORMAL LOW (ref 43.0–77.0)
Platelets: 184 10*3/uL (ref 150.0–400.0)
RBC: 3.95 Mil/uL (ref 3.87–5.11)
RDW: 13.2 % (ref 11.5–15.5)
WBC: 3.7 10*3/uL — ABNORMAL LOW (ref 4.0–10.5)

## 2021-02-21 LAB — COMPREHENSIVE METABOLIC PANEL
ALT: 15 U/L (ref 0–35)
AST: 18 U/L (ref 0–37)
Albumin: 4.2 g/dL (ref 3.5–5.2)
Alkaline Phosphatase: 71 U/L (ref 39–117)
BUN: 19 mg/dL (ref 6–23)
CO2: 28 mEq/L (ref 19–32)
Calcium: 9.6 mg/dL (ref 8.4–10.5)
Chloride: 108 mEq/L (ref 96–112)
Creatinine, Ser: 1.52 mg/dL — ABNORMAL HIGH (ref 0.40–1.20)
GFR: 35.06 mL/min — ABNORMAL LOW (ref >60.00)
Glucose, Bld: 96 mg/dL (ref 70–99)
Potassium: 4.4 mEq/L (ref 3.5–5.1)
Sodium: 143 mEq/L (ref 135–145)
Total Bilirubin: 0.6 mg/dL (ref 0.2–1.2)
Total Protein: 5.9 g/dL — ABNORMAL LOW (ref 6.0–8.3)

## 2021-02-21 LAB — LIPID PANEL
Cholesterol: 151 mg/dL (ref 0–200)
HDL: 59.8 mg/dL (ref >39.00)
LDL Cholesterol: 68 mg/dL (ref 0–99)
NonHDL: 90.83
Total CHOL/HDL Ratio: 3
Triglycerides: 115 mg/dL (ref 0.0–149.0)
VLDL: 23 mg/dL (ref 0.0–40.0)

## 2021-02-21 LAB — TSH: TSH: 1.34 u[IU]/mL (ref 0.35–4.50)

## 2021-02-21 LAB — VITAMIN D 25 HYDROXY (VIT D DEFICIENCY, FRACTURES): VITD: 34.37 ng/mL (ref 30.00–100.00)

## 2021-02-21 LAB — URIC ACID: Uric Acid, Serum: 3.2 mg/dL (ref 2.4–7.0)

## 2021-02-21 NOTE — Addendum Note (Signed)
Addended by: Jacob Moores on: 02/21/2021 09:16 AM   Modules accepted: Orders

## 2021-02-27 ENCOUNTER — Encounter: Payer: Self-pay | Admitting: Family Medicine

## 2021-02-28 ENCOUNTER — Encounter: Payer: Self-pay | Admitting: Family Medicine

## 2021-02-28 ENCOUNTER — Other Ambulatory Visit: Payer: Self-pay

## 2021-02-28 ENCOUNTER — Ambulatory Visit: Payer: Medicare PPO | Admitting: Family Medicine

## 2021-02-28 ENCOUNTER — Encounter: Payer: Medicare PPO | Admitting: Family Medicine

## 2021-02-28 VITALS — BP 124/68 | HR 74 | Temp 97.8°F | Ht 64.0 in | Wt 147.4 lb

## 2021-02-28 DIAGNOSIS — N183 Chronic kidney disease, stage 3 unspecified: Secondary | ICD-10-CM | POA: Diagnosis not present

## 2021-02-28 DIAGNOSIS — M8580 Other specified disorders of bone density and structure, unspecified site: Secondary | ICD-10-CM

## 2021-02-28 DIAGNOSIS — E782 Mixed hyperlipidemia: Secondary | ICD-10-CM | POA: Diagnosis not present

## 2021-02-28 DIAGNOSIS — M1000 Idiopathic gout, unspecified site: Secondary | ICD-10-CM

## 2021-02-28 DIAGNOSIS — F319 Bipolar disorder, unspecified: Secondary | ICD-10-CM

## 2021-02-28 DIAGNOSIS — Z0000 Encounter for general adult medical examination without abnormal findings: Secondary | ICD-10-CM

## 2021-02-28 DIAGNOSIS — M8949 Other hypertrophic osteoarthropathy, multiple sites: Secondary | ICD-10-CM | POA: Diagnosis not present

## 2021-02-28 DIAGNOSIS — M159 Polyosteoarthritis, unspecified: Secondary | ICD-10-CM

## 2021-02-28 DIAGNOSIS — I10 Essential (primary) hypertension: Secondary | ICD-10-CM

## 2021-02-28 LAB — POCT URINALYSIS DIPSTICK
Bilirubin, UA: NEGATIVE
Blood, UA: NEGATIVE
Glucose, UA: NEGATIVE
Ketones, UA: NEGATIVE
Leukocytes, UA: NEGATIVE
Nitrite, UA: NEGATIVE
Protein, UA: NEGATIVE
Spec Grav, UA: 1.01 (ref 1.010–1.025)
Urobilinogen, UA: 0.2 E.U./dL
pH, UA: 6 (ref 5.0–8.0)

## 2021-02-28 MED ORDER — PRAVASTATIN SODIUM 10 MG PO TABS: 10.0000 mg | ORAL_TABLET | Freq: Every evening | ORAL | 3 refills | Status: DC

## 2021-02-28 NOTE — Progress Notes (Signed)
Subjective  Chief Complaint  Patient presents with  . Hyperlipidemia  . Hypertension  . Chronic Kidney Disease    Requesting dipstick today, is no longer seeing nephrologist - not happy with how she was treated     HPI: Samantha Conley is a 70 y.o. female who presents to [place] [facility] [place] today for a Female Wellness Visit. She also has the concerns and/or needs as listed above in the chief complaint. These will be addressed in addition to the Health Maintenance Visit.   Wellness Visit: annual visit with health maintenance review and exam without Pap   HM: She is working part-time and remains active.  Overall doing well.  Continues to have some lower extremity pain.  Thought related to statin but it did not change with pausing therapy.  Wondering if it is a pinched nerve in her back.  She has intermittent back pain with known arthritis.  No weakness in extremities.  Screens are current and up-to-date.  Immunizations are up-to-date. Chronic disease f/u and/or acute problem visit: (deemed necessary to be done in addition to the wellness visit):  Next hyperlipidemia: Statin is now well-tolerated.  Taking nightly.  Reviewed lipid panel.  Much improved.  History of hypertension: No longer on antihypertensive.  Blood pressure remains well controlled.  No chest pain.  Reviewed recent cardiology evaluation for elevated calcium score.  Chronic renal disease, stage III: No edema.  Creatinine at 1.58.  Stable.  Mildly depressed protein.  See labs below.  Feels well.  Bipolar disease on medications per psychiatry.  Well-controlled.  Osteoarthritis with low back pain and shoulder pain, over-the-counter medications as needed.  Gout without recent flares.  On allopurinol.  Renal function is stable.  Uric acid level is at goal.  Assessment    1. Annual physical exam   2. Mixed hyperlipidemia   3. Essential hypertension   4. CRF (chronic renal failure), stage 3 (moderate)  (HCC)   5. Bipolar disease, chronic ([place])   6. Osteopenia, unspecified location   7. Primary osteoarthritis involving multiple joints   8. Idiopathic gout, unspecified chronicity, unspecified site      Plan  Female Wellness Visit:  Age appropriate Health Maintenance and Prevention measures were discussed with patient. Included topics are cancer screening recommendations, ways to keep healthy (see AVS) including dietary and exercise recommendations, regular eye and dental care, use of seat belts, and avoidance of moderate alcohol use and tobacco use.  Screens are up-to-date  BMI: discussed patient's BMI and encouraged positive lifestyle modifications to help get to or maintain a target BMI.  HM needs and immunizations were addressed and ordered. See below for orders. See HM and immunization section for updates.  Routine labs and screening tests ordered including cmp, cbc and lipids where appropriate.  Discussed recommendations regarding Vit D and calcium supplementation (see AVS)  Chronic disease management visit and/or acute problem visit:  Mixed hyperlipidemia: Continue statin nightly.  History of hypertension: Now resolved.  Chronic kidney disease: Stable.  Avoid nephrotoxins.  Check urine.  Gout: At goal on allopurinol.  Bipolar disorder well controlled on medications.  Osteoarthritis: Discussed management strategies.  Stable.   Follow up: 12 months for complete physical. Orders Placed This Encounter  Procedures  . POCT urinalysis dipstick   Meds ordered this encounter  Medications  . pravastatin (PRAVACHOL) 10 MG tablet    Sig: Take 1 tablet (10 mg total) by mouth at bedtime.    Dispense:  90 tablet  Refill:  3    Dose change      Body mass index is 25.3 kg/m. Wt Readings from Last 3 Encounters:  02/28/21 147 lb 6.4 oz (66.9 kg)  01/28/21 143 lb 12.8 oz (65.2 kg)  11/19/20 143 lb 3.2 oz (65 kg)     Patient Active Problem List   Diagnosis Date Noted   . Mixed hyperlipidemia 04/27/2017    Priority: High  . CRF (chronic renal failure), stage 3 (moderate) ([place]) 04/20/2017    Priority: High  . Bipolar disease, chronic ([place]) 03/14/2014    Priority: High  . Essential hypertension 12/14/2011    Priority: High    Dr. Daneen Schick, cardiology; 2019 cardiac CT angio: negative study 2017: neg stress ECHO   . DJD of left shoulder 02/24/2020    Priority: Medium  . Osteoarthritis - DJD cervical, thoracic, lumbar 02/24/2020    Priority: Medium  . Osteopenia 05/06/2019    Priority: Medium    DEXA: 05/2019: lowest T = -2.3 at left femur. Recheck 2 years.    . Laryngopharyngeal reflux (LPR) 10/25/2016    Priority: Medium  . Vitamin D deficiency 04/27/2017    Priority: Low  . Gout 12/14/2011    Priority: Low   Health Maintenance  Topic Date Due  . DEXA SCAN  05/04/2021  . MAMMOGRAM  05/05/2021  . TETANUS/TDAP  04/17/2027  . COLONOSCOPY (Pts 45-80yr Insurance coverage will need to be confirmed)  01/15/2028  . INFLUENZA VACCINE  Completed  . COVID-19 Vaccine  Completed  . Hepatitis C Screening  Completed  . PNA vac Low Risk Adult  Completed  . HPV VACCINES  Aged Out   Immunization History  Administered Date(s) Administered  . Fluad Quad(high Dose 65+) 10/04/2020, 10/08/2020  . Influenza, High Dose Seasonal PF 10/15/2018, 10/15/2018, 10/15/2018, 09/26/2019  . Influenza,inj,Quad PF,6+ Mos 09/27/2017  . PFIZER(Purple Top)SARS-COV-2 Vaccination 01/25/2020, 02/18/2020, 11/25/2020  . PPD Test 02/05/2018  . Pneumococcal Conjugate-13 08/10/2018, 08/10/2018  . Pneumococcal Polysaccharide-23 12/02/2019  . Tdap 04/16/2017  . Zoster 11/25/2014  . Zoster Recombinat (Shingrix) 07/09/2018, 09/08/2018   We updated and reviewed the patient's past history in detail and it is documented below. Allergies: Patient is allergic to lidocaine, benadryl [diphenhydramine hcl], codeine, cortisone, doxycycline, morphine and related, promethazine, and  rosuvastatin. Past Medical History Patient  has a past medical history of Allergy, Anemia, Anxiety, Arthritis, Bipolar disorder ([place], Bipolar disorder, unspecified ([place] (04/19/2014), Cataract, chemical imbalance (12/14/2011), Chronic kidney disease (CKD), stage III (moderate) ([place], Dehydration (04/25/2014), Dehydration, severe (03/12/2014), Depression, Diarrhea (03/12/2014), Essential hypertension, GERD (gastroesophageal reflux disease), Gout, HLD (hyperlipidemia) (04/27/2017), Hypercalcemia (03/12/2014), Insomnia, Lithium toxicity, Osteoarthritis - DJD cervical, thoracic, lumbar (02/24/2020), and Osteopenia (05/06/2019). Past Surgical History Patient  has a past surgical history that includes Cholecystectomy; Tonsillectomy; Nasal sinus surgery (2014); Cataract extraction, bilateral; Total abdominal hysterectomy; and Colonoscopy (N/A, 01/14/2018). Family History: Patient family history includes CAD in her father and mother; CVA in her mother; Cancer in her brother, brother, father, and paternal grandmother; Heart disease in her father, maternal grandfather, mother, and paternal grandfather; Hyperlipidemia in her mother; Hypertension in her mother; Parkinsonism in her father; Stroke in her maternal grandmother and mother. Social History:  Patient  reports that she has never smoked. She has never used smokeless tobacco. She reports that she does not drink alcohol and does not use drugs.  Review of Systems: Constitutional: negative for fever or malaise Ophthalmic: negative for photophobia, double vision or loss of vision Cardiovascular: negative for chest pain, dyspnea  on exertion, or new LE swelling Respiratory: negative for SOB or persistent cough Gastrointestinal: negative for abdominal pain, change in bowel habits or melena Genitourinary: negative for dysuria or gross hematuria, no abnormal uterine bleeding or disharge Musculoskeletal: negative for new gait disturbance or muscular weakness Integumentary:  negative for new or persistent rashes, no breast lumps Neurological: negative for TIA or stroke symptoms Psychiatric: negative for SI or delusions Allergic/Immunologic: negative for hives  Patient Care Team    Relationship Specialty Notifications Start End  Leamon Arnt, MD PCP - General Family Medicine  06/27/18   Belva Crome, MD PCP - Cardiology Cardiology Admissions 01/04/18   Janeth Rase, NP Nurse Practitioner Psychiatry  06/27/18   Madelon Lips, MD Consulting Physician Nephrology  06/27/18   Karma Greaser, AUD  Audiology  06/27/18     Objective  Vitals: BP 124/68   Pulse 74   Temp 97.8 F (36.6 C) (Temporal)   Ht '5\' 4"'$  (1.626 m)   Wt 147 lb 6.4 oz (66.9 kg)   SpO2 98%   BMI 25.30 kg/m  General:  Well developed, well nourished, no acute distress  Psych:  Alert and orientedx3,normal mood and affect HEENT:  Normocephalic, atraumatic, non-icteric sclera,  supple neck without adenopathy, mass or thyromegaly Cardiovascular:  Normal S1, S2, RRR without gallop, rub or murmur Respiratory:  Good breath sounds bilaterally, CTAB with normal respiratory effort Gastrointestinal: normal bowel sounds, soft, non-tender, no noted masses. No HSM MSK: no deformities, contusions. Joints are without erythema or swelling.  Skin:  Warm, no rashes or suspicious lesions noted Neurologic:    Mental status is normal. CN 2-11 are normal. Gross motor and sensory exams are normal. Normal gait. No tremor   Office Visit on 02/28/2021  Component Date Value Ref Range Status  . Color, UA 02/28/2021 straw   Final  . Clarity, UA 02/28/2021 slightly hazy   Final  . Glucose, UA 02/28/2021 Negative  Negative Final  . Bilirubin, UA 02/28/2021 negative   Final  . Ketones, UA 02/28/2021 negative   Final  . Spec Grav, UA 02/28/2021 1.010  1.010 - 1.025 Final  . Blood, UA 02/28/2021 negative   Final  . pH, UA 02/28/2021 6.0  5.0 - 8.0 Final  . Protein, UA 02/28/2021 Negative  Negative Final  .  Urobilinogen, UA 02/28/2021 0.2  0.2 or 1.0 E.U./dL Final  . Nitrite, UA 02/28/2021 negative   Final  . Leukocytes, UA 02/28/2021 Negative  Negative Final   Lab Results  Component Value Date   CREATININE 1.52 (H) 02/21/2021   BUN 19 02/21/2021   NA 143 02/21/2021   K 4.4 02/21/2021   CL 108 02/21/2021   CO2 28 02/21/2021   Lab Results  Component Value Date   LABURIC 3.2 02/21/2021   Lab Results  Component Value Date   TSH 1.34 02/21/2021   Lab Results  Component Value Date   CHOL 151 02/21/2021   HDL 59.80 02/21/2021   LDLCALC 68 02/21/2021   TRIG 115.0 02/21/2021   CHOLHDL 3 02/21/2021     Commons side effects, risks, benefits, and alternatives for medications and treatment plan prescribed today were discussed, and the patient expressed understanding of the given instructions. Patient is instructed to call or message via MyChart if he/she has any questions or concerns regarding our treatment plan. No barriers to understanding were identified. We discussed Red Flag symptoms and signs in detail. Patient expressed understanding regarding what to do in case of urgent or  emergency type symptoms.   Medication list was reconciled, printed and provided to the patient in AVS. Patient instructions and summary information was reviewed with the patient as documented in the AVS. This note was prepared with assistance of Dragon voice recognition software. Occasional wrong-word or sound-a-like substitutions may have occurred due to the inherent limitations of voice recognition software  This visit occurred during the SARS-CoV-2 public health emergency.  Safety protocols were in place, including screening questions prior to the visit, additional usage of staff PPE, and extensive cleaning of exam room while observing appropriate contact time as indicated for disinfecting solutions.

## 2021-02-28 NOTE — Patient Instructions (Signed)
Please return in 12 months for your annual check up; please come fasting.  Your labs are stable.  Continue your current medications.  If you have any questions or concerns, please don't hesitate to send me a message via MyChart or call the office at [phone]. Thank you for visiting with [place] today! It's our pleasure caring for you.

## 2021-03-02 NOTE — Progress Notes (Signed)
Labs reviewed. Stable labs discussed at visit.

## 2021-05-06 ENCOUNTER — Ambulatory Visit
Admission: RE | Admit: 2021-05-06 | Discharge: 2021-05-06 | Disposition: A | Discharge status: Home / Self Care | Payer: Medicare PPO | Source: Ambulatory Visit | Attending: Family Medicine | Admitting: Family Medicine

## 2021-05-06 ENCOUNTER — Other Ambulatory Visit: Payer: Self-pay

## 2021-05-06 DIAGNOSIS — Z780 Asymptomatic menopausal state: Secondary | ICD-10-CM | POA: Diagnosis not present

## 2021-05-06 DIAGNOSIS — Z1231 Encounter for screening mammogram for malignant neoplasm of breast: Secondary | ICD-10-CM

## 2021-05-06 DIAGNOSIS — M810 Age-related osteoporosis without current pathological fracture: Secondary | ICD-10-CM | POA: Diagnosis not present

## 2021-05-06 DIAGNOSIS — M85851 Other specified disorders of bone density and structure, right thigh: Secondary | ICD-10-CM | POA: Diagnosis not present

## 2021-05-16 ENCOUNTER — Ambulatory Visit (INDEPENDENT_AMBULATORY_CARE_PROVIDER_SITE_OTHER): Payer: Medicare PPO

## 2021-05-16 DIAGNOSIS — Z0000 Encounter for general adult medical examination without abnormal findings: Secondary | ICD-10-CM | POA: Diagnosis not present

## 2021-05-16 NOTE — Progress Notes (Signed)
Virtual Visit via Telephone Note  I connected with  Samantha Conley on 05/16/21 at  1:45 PM EDT by telephone and verified that I am speaking with the correct person using two identifiers.  Medicare Annual Wellness visit completed telephonically due to Covid-19 pandemic.   Persons participating in this call: This Health Coach and this patient.   Location: Patient: Home Provider: Office    I discussed the limitations, risks, security and privacy concerns of performing an evaluation and management service by telephone and the availability of in person appointments. The patient expressed understanding and agreed to proceed.  Unable to perform video visit due to video visit attempted and failed and/or patient does not have video capability.   Some vital signs may be absent or patient reported.   Willette Brace, LPN   Subjective:   Samantha Conley is a 70 y.o. female who presents for Medicare Annual (Subsequent) preventive examination.  Review of Systems     Cardiac Risk Factors include: advanced age (>18mn, >>12women);hypertension;dyslipidemia     Objective:    There were no vitals filed for this visit. There is no height or weight on file to calculate BMI.  Advanced Directives 05/16/2021 03/25/2020 03/25/2018 03/08/2017 05/02/2016 05/26/2015 11/29/2014  Does Patient Have a Medical Advance Directive? Yes Yes Yes Yes No No No  Type of Advance Directive Living will Living will;Healthcare Power of [place] will - - - -  Does patient want to make changes to medical advance directive? - No - Patient declined No - Patient declined - - - -  Copy of [place] Chart? - No - copy requested No - copy requested - - - -  Would patient like information on creating a medical advance directive? - - - - No - patient declined information - No - patient declined information  Pre-existing out of facility DNR order (yellow form or pink MOST form)  - - - - - - -    Current Medications (verified) Outpatient Encounter Medications as of 05/16/2021  Medication Sig   acetaminophen (TYLENOL) 500 MG tablet Take 500 mg by mouth every 6 (six) hours as needed for mild pain.   allopurinol (ZYLOPRIM) 300 MG tablet Take 1 tablet (300 mg total) by mouth at bedtime.   citalopram (CELEXA) 10 MG tablet Take 5 mg by mouth daily.   divalproex (DEPAKOTE ER) 250 MG 24 hr tablet Take 250 mg by mouth 2 (two) times daily.   doxepin (SINEQUAN) 50 MG capsule Take 50 mg by mouth at bedtime.   polyethylene glycol (MIRALAX / GLYCOLAX) packet Take 17 g by mouth daily as needed for mild constipation.   pravastatin (PRAVACHOL) 10 MG tablet Take 1 tablet (10 mg total) by mouth at bedtime.   No facility-administered encounter medications on file as of 05/16/2021.    Allergies (verified) Lidocaine, Benadryl [diphenhydramine hcl], Codeine, Cortisone, Doxycycline, Morphine and related, Promethazine, and Rosuvastatin   History: Past Medical History:  Diagnosis Date   Allergy    Anemia    Anxiety    Arthritis    back   Bipolar disorder ([facility]    Bipolar disorder, unspecified ([place] 04/19/2014   Cataract    chemical imbalance 12/14/2011   Chronic kidney disease (CKD), stage III (moderate) (HCC)    Dehydration 04/25/2014   Dehydration, severe 03/12/2014   Depression    Diarrhea 03/12/2014   Essential hypertension    GERD (gastroesophageal reflux disease)    Gout  HLD (hyperlipidemia) 04/27/2017   Hypercalcemia 03/12/2014   Insomnia    Lithium toxicity    Osteoarthritis - DJD cervical, thoracic, lumbar 02/24/2020   Osteopenia 05/06/2019   DEXA: 05/2019: lowest T = -2.3 at left femur. Recheck 2 years.    Past Surgical History:  Procedure Laterality Date   CATARACT EXTRACTION, BILATERAL     Lens implant bilaterally   CHOLECYSTECTOMY     COLONOSCOPY N/A 01/14/2018   NASAL SINUS SURGERY  2014   TONSILLECTOMY     TOTAL ABDOMINAL HYSTERECTOMY     endometriosis,  bleeding   Family History  Problem Relation Age of Onset   CAD Mother    CVA Mother    Heart disease Mother    Hyperlipidemia Mother    Hypertension Mother    Stroke Mother    CAD Father    Parkinsonism Father    Cancer Father        melanoma   Heart disease Father    Cancer Brother        prostate   Cancer Brother        melanoma   Stroke Maternal Grandmother    Heart disease Maternal Grandfather    Cancer Paternal Grandmother        colon   Heart disease Paternal Grandfather    Social History   Socioeconomic History   Marital status: Single    Spouse name: Not on file   Number of children: 0   Years of education: 20   Highest education level: Not on file  Occupational History   Occupation: retired    Fish farm manager: [employer]     Comment: librarian  Tobacco Use   Smoking status: Never   Smokeless tobacco: Never  Vaping Use   Vaping Use: Never used  Substance and Sexual Activity   Alcohol use: No    Alcohol/week: 0.0 standard drinks   Drug use: No   Sexual activity: Not Currently  Other Topics Concern   Not on file  Social History Narrative   Lives alone with two pets,   Never married   Two masters degrees   Worked for the state      Social Determinants of Radio broadcast assistant Strain: Low Risk    Difficulty of Paying Living Expenses: Not hard at all  Food Insecurity: No Food Insecurity   Worried About Charity fundraiser in the Last Year: Never true   Arboriculturist in the Last Year: Never true  Transportation Needs: No Transportation Needs   Lack of Transportation (Medical): No   Lack of Transportation (Non-Medical): No  Physical Activity: Sufficiently Active   Days of Exercise per Week: 7 days   Minutes of Exercise per Session: 40 min  Stress: No Stress Concern Present   Feeling of Stress : Only a little  Social Connections: Moderately Isolated   Frequency of Communication with Friends and Family: More than three times a week   Frequency  of Social Gatherings with Friends and Family: Not on file   Attends Religious Services: More than 4 times per year   Active Member of [employer] or Organizations: No   Attends Archivist Meetings: Never   Marital Status: Never married    Tobacco Counseling Counseling given: Not Answered   Clinical Intake:  Pre-visit preparation completed: Yes  Pain : No/denies pain     BMI - recorded: 25.3 Nutritional Status: BMI 25 -29 Overweight Nutritional Risks: None Diabetes: No  How  often do you need to have someone help you when you read instructions, pamphlets, or other written materials from your doctor or pharmacy?: 1 - Never  Diabetic?No  Interpreter Needed?: No  Information entered by :: Charlott Rakes, LPN   Activities of Daily Living In your present state of health, do you have any difficulty performing the following activities: 05/16/2021  Hearing? Y  Comment right ear hearing aid  Vision? N  Difficulty concentrating or making decisions? N  Walking or climbing stairs? N  Dressing or bathing? N  Doing errands, shopping? N  Preparing Food and eating ? N  Using the Toilet? N  In the past six months, have you accidently leaked urine? Y  Comment during the month of May and has subsided now  Do you have problems with loss of bowel control? Y  Managing your Medications? N  Managing your Finances? N  Housekeeping or managing your Housekeeping? N  Some recent data might be hidden    Patient Care Team: Leamon Arnt, MD as PCP - General (Family Medicine) Belva Crome, MD as PCP - Cardiology (Cardiology) Janeth Rase, NP as Nurse Practitioner (Psychiatry) Madelon Lips, MD as Consulting Physician (Nephrology) Karma Greaser, AUD (Audiology)  Indicate any recent Medical Services you may have received from other than [facility] providers in the past year (date may be approximate).     Assessment:   This is a routine wellness examination for  Samantha Conley.  Hearing/Vision screen Hearing Screening - Comments:: Pt  wears one hearing aid in right ear  Vision Screening - Comments:: Pt follows up with Dr Midge Aver for annual eye exams   Dietary issues and exercise activities discussed: Current Exercise Habits: Home exercise routine, Type of exercise: walking, Time (Minutes): 45, Frequency (Times/Week): 7, Weekly Exercise (Minutes/Week): 315   Goals Addressed             This Visit's Progress    Patient Stated       Lose weight         Depression Screen PHQ 2/9 Scores 05/16/2021 03/25/2020 12/30/2018 06/27/2018 03/25/2018 09/27/2017 04/20/2017  PHQ - 2 Score 0 0 0 0 0 0 0  PHQ- 9 Score - - - 0 - - -    Fall Risk Fall Risk  05/16/2021 05/05/2020 03/25/2020 12/30/2018 03/25/2018  Falls in the past year? 0 - 0 0 No  Number falls in past yr: 0 1 0 0 -  Injury with Fall? 0 1 0 0 -  Risk for fall due to : Impaired vision;Impaired balance/gait - - - -  Follow up Falls prevention discussed - Falls evaluation completed;Education provided;Falls prevention discussed Falls evaluation completed -    FALL RISK PREVENTION PERTAINING TO THE HOME:  Any stairs in or around the home? Yes  If so, are there any without handrails? No  Home free of loose throw rugs in walkways, pet beds, electrical cords, etc? Yes  Adequate lighting in your home to reduce risk of falls? Yes   ASSISTIVE DEVICES UTILIZED TO PREVENT FALLS:  Life alert? No  Use of a cane, walker or w/c? No  Grab bars in the bathroom? Yes  Shower chair or bench in shower? Yes  Elevated toilet seat or a handicapped toilet? No   TIMED UP AND GO:  Was the test performed? No .     Cognitive Function:     6CIT Screen 05/16/2021 03/25/2020 03/25/2018  What Year? 0 points 0 points 0 points  What month?  0 points 0 points 0 points  What time? 0 points 0 points 0 points  Count back from 20 0 points 0 points 0 points  Months in reverse 0 points 0 points 0 points  Repeat phrase 0  points 0 points 0 points  Total Score 0 0 0    Immunizations Immunization History  Administered Date(s) Administered   Fluad Quad(high Dose 65+) 10/04/2020, 10/08/2020   Influenza, High Dose Seasonal PF 10/15/2018, 10/15/2018, 10/15/2018, 09/26/2019   Influenza,inj,Quad PF,6+ Mos 09/27/2017   PFIZER(Purple Top)SARS-COV-2 Vaccination 01/25/2020, 02/18/2020, 11/25/2020   PPD Test 02/05/2018   Pneumococcal Conjugate-13 08/10/2018, 08/10/2018   Pneumococcal Polysaccharide-23 12/02/2019   Tdap 04/16/2017   Zoster Recombinat (Shingrix) 07/09/2018, 09/08/2018   Zoster, Live 11/25/2014    TDAP status: Up to date  Flu Vaccine status: Up to date  Pneumococcal vaccine status: Up to date  Covid-19 vaccine status: Completed vaccines  Qualifies for Shingles Vaccine? Yes   Zostavax completed Yes   Shingrix Completed?: Yes  Screening Tests Health Maintenance  Topic Date Due   COVID-19 Vaccine (4 - Booster for Pfizer series) 03/26/2021   INFLUENZA VACCINE  07/04/2021   MAMMOGRAM  05/06/2022   DEXA SCAN  05/07/2023   TETANUS/TDAP  04/17/2027   COLONOSCOPY (Pts 45-52yr Insurance coverage will need to be confirmed)  01/15/2028   Hepatitis C Screening  Completed   PNA vac Low Risk Adult  Completed   Zoster Vaccines- Shingrix  Completed   HPV VACCINES  Aged Out    Health Maintenance  Health Maintenance Due  Topic Date Due   COVID-19 Vaccine (4 - Booster for [place]) 03/26/2021    Colorectal cancer screening: Type of screening: Colonoscopy. Completed 01/14/18. Repeat every 10 years  Mammogram status: Completed 05/06/21. Repeat every year  Bone Density status: Completed 05/06/21. Results reflect: Bone density results: OSTEOPENIA. Repeat every 2-3 years.   Additional Screening:  Hepatitis C Screening:  Completed 03/25/18  Vision Screening: Recommended annual ophthalmology exams for early detection of glaucoma and other disorders of the eye. Is the patient up to date with  their annual eye exam?  Yes  Who is the provider or what is the name of the office in which the patient attends annual eye exams? Dr CMidge Aver If pt is not established with a provider, would they like to be referred to a provider to establish care? No .   Dental Screening: Recommended annual dental exams for proper oral hygiene  Community Resource Referral / Chronic Care Management: CRR required this visit?  No   CCM required this visit?  No      Plan:     I have personally reviewed and noted the following in the patient's chart:   Medical and social history Use of alcohol, tobacco or illicit drugs  Current medications and supplements including opioid prescriptions.  Functional ability and status Nutritional status Physical activity Advanced directives List of other physicians Hospitalizations, surgeries, and ER visits in previous 12 months Vitals Screenings to include cognitive, depression, and falls Referrals and appointments  In addition, I have reviewed and discussed with patient certain preventive protocols, quality metrics, and best practice recommendations. A written personalized care plan for preventive services as well as general preventive health recommendations were provided to patient.     TWillette Brace LPN   [id number]  Nurse Notes: None

## 2021-05-16 NOTE — Patient Instructions (Signed)
Samantha Conley , Thank you for taking time to come for your Medicare Wellness Visit. I appreciate your ongoing commitment to your health goals. Please review the following plan we discussed and let me know if I can assist you in the future.   Screening recommendations/referrals: Colonoscopy: Done 01/14/18 due 01/15/28 Mammogram: Done 05/06/21 repeat every year Bone Density:  Done 05/06/21 repeat every 2-3 years Recommended yearly ophthalmology/optometry visit for glaucoma screening and checkup Recommended yearly dental visit for hygiene and checkup  Vaccinations: Influenza vaccine: Due 07/04/21 Pneumococcal vaccine: Completed  Tdap vaccine: done 04/16/17 due 04/17/27 Shingles vaccine: Completed 8/6 & 09/08/18   Covid-19:Completed 2/21, 3/17, & 11/25/20  Advanced directives: Please bring a copy of your health care power of attorney and living will to the office at your convenience.  Conditions/risks identified: Lose weight   Next appointment: Follow up in one year for your annual wellness visit    Preventive Care 65 Years and Older, Female Preventive care refers to lifestyle choices and visits with your health care provider that can promote health and wellness. What does preventive care include? A yearly physical exam. This is also called an annual well check. Dental exams once or twice a year. Routine eye exams. Ask your health care provider how often you should have your eyes checked. Personal lifestyle choices, including: Daily care of your teeth and gums. Regular physical activity. Eating a healthy diet. Avoiding tobacco and drug use. Limiting alcohol use. Practicing safe sex. Taking low-dose aspirin every day. Taking vitamin and mineral supplements as recommended by your health care provider. What happens during an annual well check? The services and screenings done by your health care provider during your annual well check will depend on your age, overall health, lifestyle risk factors,  and family history of disease. Counseling  Your health care provider may ask you questions about your: Alcohol use. Tobacco use. Drug use. Emotional well-being. Home and relationship well-being. Sexual activity. Eating habits. History of falls. Memory and ability to understand (cognition). Work and work Statistician. Reproductive health. Screening  You may have the following tests or measurements: Height, weight, and BMI. Blood pressure. Lipid and cholesterol levels. These may be checked every 5 years, or more frequently if you are over 53 years old. Skin check. Lung cancer screening. You may have this screening every year starting at age 62 if you have a 30-pack-year history of smoking and currently smoke or have quit within the past 15 years. Fecal occult blood test (FOBT) of the stool. You may have this test every year starting at age 40. Flexible sigmoidoscopy or colonoscopy. You may have a sigmoidoscopy every 5 years or a colonoscopy every 10 years starting at age 72. Hepatitis C blood test. Hepatitis B blood test. Sexually transmitted disease (STD) testing. Diabetes screening. This is done by checking your blood sugar (glucose) after you have not eaten for a while (fasting). You may have this done every 1-3 years. Bone density scan. This is done to screen for osteoporosis. You may have this done starting at age 29. Mammogram. This may be done every 1-2 years. Talk to your health care provider about how often you should have regular mammograms. Talk with your health care provider about your test results, treatment options, and if necessary, the need for more tests. Vaccines  Your health care provider may recommend certain vaccines, such as: Influenza vaccine. This is recommended every year. Tetanus, diphtheria, and acellular pertussis (Tdap, Td) vaccine. You may need a Td booster every  10 years. Zoster vaccine. You may need this after age 56. Pneumococcal 13-valent conjugate  (PCV13) vaccine. One dose is recommended after age 73. Pneumococcal polysaccharide (PPSV23) vaccine. One dose is recommended after age 38. Talk to your health care provider about which screenings and vaccines you need and how often you need them. This information is not intended to replace advice given to you by your health care provider. Make sure you discuss any questions you have with your health care provider. Document Released: 12/17/2015 Document Revised: 08/09/2016 Document Reviewed: 09/21/2015 Elsevier Interactive Patient Education  2017 [place] Prevention in the Home Falls can cause injuries. They can happen to people of all ages. There are many things you can do to make your home safe and to help prevent falls. What can I do on the outside of my home? Regularly fix the edges of walkways and driveways and fix any cracks. Remove anything that might make you trip as you walk through a door, such as a raised step or threshold. Trim any bushes or trees on the path to your home. Use bright outdoor lighting. Clear any walking paths of anything that might make someone trip, such as rocks or tools. Regularly check to see if handrails are loose or broken. Make sure that both sides of any steps have handrails. Any raised decks and porches should have guardrails on the edges. Have any leaves, snow, or ice cleared regularly. Use sand or salt on walking paths during winter. Clean up any spills in your garage right away. This includes oil or grease spills. What can I do in the bathroom? Use night lights. Install grab bars by the toilet and in the tub and shower. Do not use towel bars as grab bars. Use non-skid mats or decals in the tub or shower. If you need to sit down in the shower, use a plastic, non-slip stool. Keep the floor dry. Clean up any water that spills on the floor as soon as it happens. Remove soap buildup in the tub or shower regularly. Attach bath mats securely with  double-sided non-slip rug tape. Do not have throw rugs and other things on the floor that can make you trip. What can I do in the bedroom? Use night lights. Make sure that you have a light by your bed that is easy to reach. Do not use any sheets or blankets that are too big for your bed. They should not hang down onto the floor. Have a firm chair that has side arms. You can use this for support while you get dressed. Do not have throw rugs and other things on the floor that can make you trip. What can I do in the kitchen? Clean up any spills right away. Avoid walking on wet floors. Keep items that you use a lot in easy-to-reach places. If you need to reach something above you, use a strong step stool that has a grab bar. Keep electrical cords out of the way. Do not use floor polish or wax that makes floors slippery. If you must use wax, use non-skid floor wax. Do not have throw rugs and other things on the floor that can make you trip. What can I do with my stairs? Do not leave any items on the stairs. Make sure that there are handrails on both sides of the stairs and use them. Fix handrails that are broken or loose. Make sure that handrails are as long as the stairways. Check any carpeting to make  sure that it is firmly attached to the stairs. Fix any carpet that is loose or worn. Avoid having throw rugs at the top or bottom of the stairs. If you do have throw rugs, attach them to the floor with carpet tape. Make sure that you have a light switch at the top of the stairs and the bottom of the stairs. If you do not have them, ask someone to add them for you. What else can I do to help prevent falls? Wear shoes that: Do not have high heels. Have rubber bottoms. Are comfortable and fit you well. Are closed at the toe. Do not wear sandals. If you use a stepladder: Make sure that it is fully opened. Do not climb a closed stepladder. Make sure that both sides of the stepladder are locked  into place. Ask someone to hold it for you, if possible. Clearly mark and make sure that you can see: Any grab bars or handrails. First and last steps. Where the edge of each step is. Use tools that help you move around (mobility aids) if they are needed. These include: Canes. Walkers. Scooters. Crutches. Turn on the lights when you go into a dark area. Replace any light bulbs as soon as they burn out. Set up your furniture so you have a clear path. Avoid moving your furniture around. If any of your floors are uneven, fix them. If there are any pets around you, be aware of where they are. Review your medicines with your doctor. Some medicines can make you feel dizzy. This can increase your chance of falling. Ask your doctor what other things that you can do to help prevent falls. This information is not intended to replace advice given to you by your health care provider. Make sure you discuss any questions you have with your health care provider. Document Released: 09/16/2009 Document Revised: 04/27/2016 Document Reviewed: 12/25/2014 Elsevier Interactive Patient Education  2017 [employer].

## 2021-05-26 ENCOUNTER — Encounter: Payer: Self-pay | Admitting: Family Medicine

## 2021-05-26 NOTE — Progress Notes (Signed)
Please call patient: I have reviewed his/her lab results. DEXA shows that she has progressed to new diagnosis of osteoporosis: there are treatment options that can be discussed: schedule ov at her convenience. thanks

## 2021-06-10 ENCOUNTER — Encounter: Payer: Self-pay | Admitting: Family Medicine

## 2021-06-14 DIAGNOSIS — F319 Bipolar disorder, unspecified: Secondary | ICD-10-CM | POA: Diagnosis not present

## 2021-06-14 DIAGNOSIS — G4700 Insomnia, unspecified: Secondary | ICD-10-CM | POA: Diagnosis not present

## 2021-08-26 ENCOUNTER — Telehealth: Payer: Self-pay | Admitting: Interventional Cardiology

## 2021-08-26 DIAGNOSIS — E7849 Other hyperlipidemia: Secondary | ICD-10-CM

## 2021-08-26 NOTE — Telephone Encounter (Signed)
Patient would like to know what labs Dr. Tamala Julian wants her to have done before her yearly appointment in February. Please advise

## 2021-08-29 NOTE — Telephone Encounter (Signed)
Called pt and left message to call back.    She will need a Lipid and Liver prior to appt if not already drawn/plan to draw with PCP.  Looks like PCP did full panel of labs this year.

## 2021-08-29 NOTE — Telephone Encounter (Signed)
Samantha Conley is returning Samantha Conley's call. She is requesting a callback after 4:00 PM tomorrow.

## 2021-08-30 NOTE — Telephone Encounter (Signed)
Spoke with pt and scheduled labs for 12/19/21.  Pt appreciative for call.

## 2021-08-30 NOTE — Telephone Encounter (Signed)
Patient is returning call.  °

## 2021-08-30 NOTE — Telephone Encounter (Signed)
Left message to call back  

## 2021-09-15 ENCOUNTER — Telehealth: Payer: Self-pay

## 2021-09-15 NOTE — Telephone Encounter (Signed)
Pt called requesting labs. Samantha Conley stated that Dr Jonni Sanger sees her once a year for her medication and normally does labs before the appt. She would like to know if labs can be ordered for 03/10/22. Please Advise.

## 2021-09-15 NOTE — Telephone Encounter (Signed)
Please advise 

## 2021-09-19 NOTE — Telephone Encounter (Signed)
Called patient back, no answer, left a message to call the office back regarding her request.

## 2021-10-05 ENCOUNTER — Telehealth: Payer: Self-pay

## 2021-10-05 NOTE — Telephone Encounter (Signed)
Please advise 

## 2021-10-05 NOTE — Telephone Encounter (Signed)
Patient is calling in stating she would like [place] to call her pharmacy, [facility] in [place] to advise them that she can not receive the high dose flu shot. When she scheduled with them they told her that they have to give her the high dose per CDC, but Samantha Conley states she is scared as the last 4 times she received a high dose flu shot she ended up in the hospital sick.

## 2021-10-05 NOTE — Telephone Encounter (Signed)
Spoke with patient, states she is unable to come to get a regular dose flu shot

## 2021-10-07 ENCOUNTER — Encounter: Payer: Self-pay | Admitting: Family Medicine

## 2021-11-09 ENCOUNTER — Other Ambulatory Visit: Payer: Self-pay | Admitting: Family Medicine

## 2021-11-09 DIAGNOSIS — Z1231 Encounter for screening mammogram for malignant neoplasm of breast: Secondary | ICD-10-CM

## 2021-12-01 ENCOUNTER — Other Ambulatory Visit: Payer: Self-pay | Admitting: Family Medicine

## 2021-12-01 ENCOUNTER — Encounter: Payer: Self-pay | Admitting: Interventional Cardiology

## 2021-12-01 ENCOUNTER — Other Ambulatory Visit: Payer: Self-pay

## 2021-12-01 NOTE — Telephone Encounter (Signed)
error 

## 2021-12-10 ENCOUNTER — Other Ambulatory Visit: Payer: Self-pay | Admitting: Family Medicine

## 2021-12-10 DIAGNOSIS — M1000 Idiopathic gout, unspecified site: Secondary | ICD-10-CM

## 2021-12-14 DIAGNOSIS — G4700 Insomnia, unspecified: Secondary | ICD-10-CM | POA: Diagnosis not present

## 2021-12-14 DIAGNOSIS — F319 Bipolar disorder, unspecified: Secondary | ICD-10-CM | POA: Diagnosis not present

## 2021-12-19 ENCOUNTER — Other Ambulatory Visit: Payer: Medicare PPO

## 2021-12-26 ENCOUNTER — Other Ambulatory Visit: Payer: Medicare PPO

## 2021-12-27 ENCOUNTER — Other Ambulatory Visit: Payer: Self-pay

## 2021-12-27 ENCOUNTER — Other Ambulatory Visit: Payer: Medicare PPO | Admitting: *Deleted

## 2021-12-27 DIAGNOSIS — E7849 Other hyperlipidemia: Secondary | ICD-10-CM | POA: Diagnosis not present

## 2021-12-27 LAB — LIPID PANEL
Chol/HDL Ratio: 2.6 ratio (ref 0.0–4.4)
Cholesterol, Total: 173 mg/dL (ref 100–199)
HDL: 67 mg/dL (ref >39)
LDL Chol Calc (NIH): 95 mg/dL (ref 0–99)
Triglycerides: 55 mg/dL (ref 0–149)
VLDL Cholesterol Cal: 11 mg/dL (ref 5–40)

## 2021-12-27 LAB — HEPATIC FUNCTION PANEL
ALT: 12 IU/L (ref 0–32)
AST: 20 IU/L (ref 0–40)
Albumin: 4.4 g/dL (ref 3.8–4.8)
Alkaline Phosphatase: 103 IU/L (ref 44–121)
Bilirubin Total: 0.7 mg/dL (ref 0.0–1.2)
Bilirubin, Direct: 0.21 mg/dL (ref 0.00–0.40)
Total Protein: 6.5 g/dL (ref 6.0–8.5)

## 2021-12-30 ENCOUNTER — Telehealth: Payer: Self-pay

## 2021-12-30 DIAGNOSIS — E7849 Other hyperlipidemia: Secondary | ICD-10-CM

## 2021-12-30 MED ORDER — ROSUVASTATIN CALCIUM 10 MG PO TABS: 10.0000 mg | ORAL_TABLET | Freq: Every day | ORAL | 3 refills | Status: DC

## 2021-12-30 NOTE — Telephone Encounter (Signed)
The patient has been notified of the result and verbalized understanding.  All questions (if any) were answered. Antonieta Iba, RN 12/30/2021 4:58 PM  Rx has been sent in. Labs have been scheduled.

## 2021-12-30 NOTE — Telephone Encounter (Signed)
-----   Message from Belva Crome, MD sent at 12/30/2021 12:31 PM EST ----- Let the patient know the LDL is too high.See if she will switch to rosuvastatin 10 mg daily. Trying to get LDL < 70. Repeat liver/lipid 6-8 weeks. A copy will be sent to Leamon Arnt, MD

## 2022-01-23 NOTE — Progress Notes (Signed)
Cardiology Office Note:    Date:  01/24/2022   ID:  Samantha Conley, DOB [birth year], MRN [id number]  PCP:  Leamon Arnt, MD  Cardiologist:  Sinclair Grooms, MD   Referring MD: Leamon Arnt, MD   Chief Complaint  Patient presents with   Coronary Artery Disease    History of Present Illness:    Samantha Conley is a 71 y.o. female with a family history of vascular disease, and personal history of hyperlipidemia (untreated), prior negative ischemic workup with stress echo 4193, diastolic left ventricular dysfunction, and CKD 3.  Mildly abnormal coronary calcium score 2019, 24.   She is asymptomatic.  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  Somewhat frustrated by the overall system.  No medication side effects.  Past Medical History:  Diagnosis Date   Allergy    Anemia    Anxiety    Arthritis    back   Bipolar disorder ([place])    Bipolar disorder, unspecified ([place]) 04/19/2014   Cataract    chemical imbalance 12/14/2011   Chronic kidney disease (CKD), stage III (moderate) (HCC)    Dehydration 04/25/2014   Dehydration, severe 03/12/2014   Depression    Diarrhea 03/12/2014   Essential hypertension    GERD (gastroesophageal reflux disease)    Gout    HLD (hyperlipidemia) 04/27/2017   Hypercalcemia 03/12/2014   Insomnia    Lithium toxicity    Osteoarthritis - DJD cervical, thoracic, lumbar 02/24/2020   Osteopenia 05/06/2019   DEXA: 05/2019: lowest T = -2.3 at left femur. Recheck 2 years.     Past Surgical History:  Procedure Laterality Date   CATARACT EXTRACTION, BILATERAL     Lens implant bilaterally   CHOLECYSTECTOMY     COLONOSCOPY N/A 01/14/2018   NASAL SINUS SURGERY  2014   TONSILLECTOMY     TOTAL ABDOMINAL HYSTERECTOMY     endometriosis, bleeding    Current Medications: Current Meds  Medication Sig   acetaminophen (TYLENOL) 500 MG tablet Take 500 mg by mouth every 6 (six) hours as needed for mild pain.   allopurinol (ZYLOPRIM) 300 MG tablet  TAKE (1) TABLET BY MOUTH AT BEDTIME.   citalopram (CELEXA) 10 MG tablet Take 5 mg by mouth daily.   divalproex (DEPAKOTE ER) 250 MG 24 hr tablet Take 250 mg by mouth 2 (two) times daily.   doxepin (SINEQUAN) 50 MG capsule Take 50 mg by mouth at bedtime.   polyethylene glycol (MIRALAX / GLYCOLAX) packet Take 17 g by mouth daily as needed for mild constipation.   rosuvastatin (CRESTOR) 10 MG tablet Take 1 tablet (10 mg total) by mouth daily.     Allergies:   Lidocaine, Benadryl [diphenhydramine hcl], Codeine, Cortisone, Doxycycline, Morphine and related, Promethazine, and Rosuvastatin   Social History   Socioeconomic History   Marital status: Single    Spouse name: Not on file   Number of children: 0   Years of education: 20   Highest education level: Not on file  Occupational History   Occupation: retired    Fish farm manager: [employer]    Comment: librarian  Tobacco Use   Smoking status: Never   Smokeless tobacco: Never  Vaping Use   Vaping Use: Never used  Substance and Sexual Activity   Alcohol use: No    Alcohol/week: 0.0 standard drinks   Drug use: No   Sexual activity: Not Currently  Other Topics Concern   Not on file  Social  History Narrative   Lives alone with two pets,   Never married   Two masters degrees   Worked for the state      Social Determinants of Radio broadcast assistant Strain: Low Risk    Difficulty of Paying Living Expenses: Not hard at all  Food Insecurity: No Food Insecurity   Worried About Charity fundraiser in the Last Year: Never true   Arboriculturist in the Last Year: Never true  Transportation Needs: No Transportation Needs   Lack of Transportation (Medical): No   Lack of Transportation (Non-Medical): No  Physical Activity: Sufficiently Active   Days of Exercise per Week: 7 days   Minutes of Exercise per Session: 40 min  Stress: No Stress Concern Present   Feeling of Stress : Only a little  Social Connections: Moderately Isolated    Frequency of Communication with Friends and Family: More than three times a week   Frequency of Social Gatherings with Friends and Family: Not on file   Attends Religious Services: More than 4 times per year   Active Member of [employer] or Organizations: No   Attends Music therapist: Never   Marital Status: Never married     Family History: The patient's family history includes CAD in her father and mother; CVA in her mother; Cancer in her brother, brother, father, and paternal grandmother; Heart disease in her father, maternal grandfather, mother, and paternal grandfather; Hyperlipidemia in her mother; Hypertension in her mother; Parkinsonism in her father; Stroke in her maternal grandmother and mother.  ROS:   Please see the history of present illness.    No all other systems reviewed and are negative.  EKGs/Labs/Other Studies Reviewed:    The following studies were reviewed today: Coronary calcium score 2019: IMPRESSION: 1. Coronary artery calcium score 24 Agatston units, placing the patient in the 66th percentile for age and gender. This suggests intermediate risk for future cardiac events.   2. Plaque throughout the proximal LAD. Probably no more than mild stenosis but will send for FFR to confirm.  EKG:  EKG normal sinus rhythm with decreased voltage.  Recent Labs: 02/21/2021: BUN 19; Creatinine, Ser 1.52; Hemoglobin 12.2; Platelets 184.0; Potassium 4.4; Sodium 143; TSH 1.34 12/27/2021: ALT 12  Recent Lipid Panel    Component Value Date/Time   CHOL 173 12/27/2021 1122   TRIG 55 12/27/2021 1122   HDL 67 12/27/2021 1122   CHOLHDL 2.6 12/27/2021 1122   CHOLHDL 3 02/21/2021 0920   VLDL 23.0 02/21/2021 0920   LDLCALC 95 12/27/2021 1122    Physical Exam:    VS:  BP 138/70    Pulse 64    Ht 5\' 4"  (1.626 m)    Wt 145 lb 6.4 oz (66 kg)    SpO2 99%    BMI 24.96 kg/m     Wt Readings from Last 3 Encounters:  01/24/22 145 lb 6.4 oz (66 kg)  02/28/21 147 lb 6.4 oz  (66.9 kg)  01/28/21 143 lb 12.8 oz (65.2 kg)     GEN: Overweight. No acute distress HEENT: Normal NECK: No JVD. LYMPHATICS: No lymphadenopathy CARDIAC: No murmur. RRR no gallop, or edema. VASCULAR:  Normal Pulses. No bruits. RESPIRATORY:  Clear to auscultation without rales, wheezing or rhonchi  ABDOMEN: Soft, non-tender, non-distended, No pulsatile mass, MUSCULOSKELETAL: No deformity  SKIN: Warm and dry NEUROLOGIC:  Alert and oriented x 3 PSYCHIATRIC:  Normal affect   ASSESSMENT:    1. Coronary artery  calcification seen on CT scan   2. CKD (chronic kidney disease) stage 4, GFR 15-29 ml/min (HCC)   3. Other hyperlipidemia   4. Essential hypertension    PLAN:    In order of problems listed above:  This primary prevention.  Lipid-lowering.  Last LDL was 20 points above target.  Statin intensity was increased.  Lipid panel will be rechecked. Creatinine is elevated and GFR is 35, consistent with CKD 3B. Continue rosuvastatin 10 mg/day.  Lipid panel to be done in early April. Continue observation.  Elevated creatinine etiology is uncertain.  Overall education and awareness concerning primary risk prevention was discussed in detail: LDL less than 70, hemoglobin A1c less than 7, blood pressure target less than 130/80 mmHg, >150 minutes of moderate aerobic activity per week, avoidance of smoking, weight control (via diet and exercise), and continued surveillance/management of/for obstructive sleep apnea.    Medication Adjustments/Labs and Tests Ordered: Current medicines are reviewed at length with the patient today.  Concerns regarding medicines are outlined above.  Orders Placed This Encounter  Procedures   EKG 12-Lead   No orders of the defined types were placed in this encounter.   There are no Patient Instructions on file for this visit.   Signed, Sinclair Grooms, MD  01/24/2022 4:42 PM    [facility]

## 2022-01-24 ENCOUNTER — Ambulatory Visit: Payer: Medicare PPO | Admitting: Interventional Cardiology

## 2022-01-24 ENCOUNTER — Other Ambulatory Visit: Payer: Self-pay

## 2022-01-24 ENCOUNTER — Encounter: Payer: Self-pay | Admitting: Interventional Cardiology

## 2022-01-24 VITALS — BP 138/70 | HR 64 | Ht 64.0 in | Wt 145.4 lb

## 2022-01-24 DIAGNOSIS — E7849 Other hyperlipidemia: Secondary | ICD-10-CM

## 2022-01-24 DIAGNOSIS — I10 Essential (primary) hypertension: Secondary | ICD-10-CM | POA: Diagnosis not present

## 2022-01-24 DIAGNOSIS — N184 Chronic kidney disease, stage 4 (severe): Secondary | ICD-10-CM

## 2022-01-24 DIAGNOSIS — I2510 Atherosclerotic heart disease of native coronary artery without angina pectoris: Secondary | ICD-10-CM

## 2022-01-24 NOTE — Patient Instructions (Signed)
Medication Instructions:  °Your physician recommends that you continue on your current medications as directed. Please refer to the Current Medication list given to you today. ° °*If you need a refill on your cardiac medications before your next appointment, please call your pharmacy* ° ° °Lab Work: °NONE °If you have labs (blood work) drawn today and your tests are completely normal, you will receive your results only by: °MyChart Message (if you have MyChart) OR °A paper copy in the mail °If you have any lab test that is abnormal or we need to change your treatment, we will call you to review the results. ° ° °Testing/Procedures: °NONE ° ° °Follow-Up: °At [facility], you and your health needs are our priority.  As part of our continuing mission to provide you with exceptional heart care, we have created designated Provider Care Teams.  These Care Teams include your primary Cardiologist (physician) and Advanced Practice Providers (APPs -  Physician Assistants and Nurse Practitioners) who all work together to provide you with the care you need, when you need it. ° °We recommend signing up for the patient portal called "MyChart".  Sign up information is provided on this After Visit Summary.  MyChart is used to connect with patients for Virtual Visits (Telemedicine).  Patients are able to view lab/test results, encounter notes, upcoming appointments, etc.  Non-urgent messages can be sent to your provider as well.   °To learn more about what you can do with MyChart, go to [email or web address].   ° °Your next appointment:   °1 year(s) ° °The format for your next appointment:   °In Person ° °Provider:   °Henry W Smith III, MD   ° °

## 2022-01-27 DIAGNOSIS — H26491 Other secondary cataract, right eye: Secondary | ICD-10-CM | POA: Diagnosis not present

## 2022-01-27 DIAGNOSIS — H1045 Other chronic allergic conjunctivitis: Secondary | ICD-10-CM | POA: Diagnosis not present

## 2022-01-27 DIAGNOSIS — H43813 Vitreous degeneration, bilateral: Secondary | ICD-10-CM | POA: Diagnosis not present

## 2022-01-27 DIAGNOSIS — Z961 Presence of intraocular lens: Secondary | ICD-10-CM | POA: Diagnosis not present

## 2022-01-27 DIAGNOSIS — H0102A Squamous blepharitis right eye, upper and lower eyelids: Secondary | ICD-10-CM | POA: Diagnosis not present

## 2022-01-27 DIAGNOSIS — H52222 Regular astigmatism, left eye: Secondary | ICD-10-CM | POA: Diagnosis not present

## 2022-01-27 DIAGNOSIS — H0102B Squamous blepharitis left eye, upper and lower eyelids: Secondary | ICD-10-CM | POA: Diagnosis not present

## 2022-01-27 DIAGNOSIS — H16223 Keratoconjunctivitis sicca, not specified as Sjogren's, bilateral: Secondary | ICD-10-CM | POA: Diagnosis not present

## 2022-03-02 ENCOUNTER — Other Ambulatory Visit: Payer: Medicare PPO

## 2022-03-14 ENCOUNTER — Ambulatory Visit: Payer: Medicare PPO | Admitting: Family Medicine

## 2022-03-15 ENCOUNTER — Other Ambulatory Visit: Payer: Medicare PPO | Admitting: *Deleted

## 2022-03-15 DIAGNOSIS — E7849 Other hyperlipidemia: Secondary | ICD-10-CM | POA: Diagnosis not present

## 2022-03-15 LAB — LIPID PANEL
Chol/HDL Ratio: 3 ratio (ref 0.0–4.4)
Cholesterol, Total: 163 mg/dL (ref 100–199)
HDL: 55 mg/dL (ref >39)
LDL Chol Calc (NIH): 93 mg/dL (ref 0–99)
Triglycerides: 77 mg/dL (ref 0–149)
VLDL Cholesterol Cal: 15 mg/dL (ref 5–40)

## 2022-03-15 LAB — ALT: ALT: 17 IU/L (ref 0–32)

## 2022-03-16 ENCOUNTER — Encounter: Payer: Self-pay | Admitting: Interventional Cardiology

## 2022-05-09 ENCOUNTER — Ambulatory Visit
Admission: RE | Admit: 2022-05-09 | Discharge: 2022-05-09 | Disposition: A | Discharge status: Home / Self Care | Payer: Medicare PPO | Source: Ambulatory Visit | Attending: Family Medicine | Admitting: Family Medicine

## 2022-05-09 ENCOUNTER — Ambulatory Visit: Payer: Medicare PPO

## 2022-05-09 ENCOUNTER — Ambulatory Visit: Payer: Medicare PPO | Admitting: Family Medicine

## 2022-05-09 DIAGNOSIS — E7800 Pure hypercholesterolemia, unspecified: Secondary | ICD-10-CM | POA: Diagnosis not present

## 2022-05-09 DIAGNOSIS — F319 Bipolar disorder, unspecified: Secondary | ICD-10-CM | POA: Diagnosis not present

## 2022-05-09 DIAGNOSIS — N1831 Chronic kidney disease, stage 3a: Secondary | ICD-10-CM | POA: Diagnosis not present

## 2022-05-09 DIAGNOSIS — Z1231 Encounter for screening mammogram for malignant neoplasm of breast: Secondary | ICD-10-CM | POA: Diagnosis not present

## 2022-05-09 DIAGNOSIS — M1A9XX0 Chronic gout, unspecified, without tophus (tophi): Secondary | ICD-10-CM | POA: Diagnosis not present

## 2022-05-09 DIAGNOSIS — Z79899 Other long term (current) drug therapy: Secondary | ICD-10-CM | POA: Diagnosis not present

## 2022-05-22 ENCOUNTER — Ambulatory Visit: Payer: Medicare PPO

## 2022-06-13 DIAGNOSIS — F319 Bipolar disorder, unspecified: Secondary | ICD-10-CM | POA: Diagnosis not present

## 2022-06-13 DIAGNOSIS — G4700 Insomnia, unspecified: Secondary | ICD-10-CM | POA: Diagnosis not present

## 2022-07-25 DIAGNOSIS — R197 Diarrhea, unspecified: Secondary | ICD-10-CM | POA: Diagnosis not present

## 2022-08-14 ENCOUNTER — Telehealth: Payer: Self-pay | Admitting: Family Medicine

## 2022-08-14 NOTE — Telephone Encounter (Signed)
Copied from [place] [id number]. Topic: Medicare AWV >> Aug 14, 2022  9:28 AM Devoria Glassing wrote: Reason for CRM: Left message for patient to schedule Annual Wellness Visit.  Please schedule with Nurse Health Advisor Charlott Rakes, RN at [facility]. This appt can be telephone or office visit. Please call [phone] ask for [facility]

## 2022-08-15 DIAGNOSIS — R944 Abnormal results of kidney function studies: Secondary | ICD-10-CM | POA: Diagnosis not present

## 2022-08-28 ENCOUNTER — Encounter: Payer: Self-pay | Admitting: *Deleted

## 2022-10-14 ENCOUNTER — Encounter (INDEPENDENT_AMBULATORY_CARE_PROVIDER_SITE_OTHER): Payer: Self-pay | Admitting: Gastroenterology

## 2022-11-16 ENCOUNTER — Encounter: Payer: Self-pay | Admitting: *Deleted

## 2022-11-18 DIAGNOSIS — E559 Vitamin D deficiency, unspecified: Secondary | ICD-10-CM | POA: Diagnosis not present

## 2022-11-18 DIAGNOSIS — N281 Cyst of kidney, acquired: Secondary | ICD-10-CM | POA: Diagnosis not present

## 2022-11-18 DIAGNOSIS — I5032 Chronic diastolic (congestive) heart failure: Secondary | ICD-10-CM | POA: Diagnosis not present

## 2022-11-18 DIAGNOSIS — E211 Secondary hyperparathyroidism, not elsewhere classified: Secondary | ICD-10-CM | POA: Diagnosis not present

## 2022-11-18 DIAGNOSIS — N1832 Chronic kidney disease, stage 3b: Secondary | ICD-10-CM | POA: Diagnosis not present

## 2022-11-20 ENCOUNTER — Other Ambulatory Visit (HOSPITAL_COMMUNITY): Payer: Self-pay | Admitting: Nephrology

## 2022-11-20 DIAGNOSIS — N1832 Chronic kidney disease, stage 3b: Secondary | ICD-10-CM

## 2022-11-21 DIAGNOSIS — F319 Bipolar disorder, unspecified: Secondary | ICD-10-CM | POA: Diagnosis not present

## 2022-11-21 DIAGNOSIS — G4700 Insomnia, unspecified: Secondary | ICD-10-CM | POA: Diagnosis not present

## 2022-11-23 ENCOUNTER — Ambulatory Visit (HOSPITAL_COMMUNITY)
Admission: RE | Admit: 2022-11-23 | Discharge: 2022-11-23 | Disposition: A | Discharge status: Home / Self Care | Payer: Medicare PPO | Source: Ambulatory Visit | Attending: Nephrology | Admitting: Nephrology

## 2022-11-23 DIAGNOSIS — N1832 Chronic kidney disease, stage 3b: Secondary | ICD-10-CM | POA: Diagnosis not present

## 2022-11-23 DIAGNOSIS — N189 Chronic kidney disease, unspecified: Secondary | ICD-10-CM | POA: Diagnosis not present

## 2022-11-23 DIAGNOSIS — E211 Secondary hyperparathyroidism, not elsewhere classified: Secondary | ICD-10-CM | POA: Diagnosis not present

## 2022-11-23 DIAGNOSIS — E559 Vitamin D deficiency, unspecified: Secondary | ICD-10-CM | POA: Diagnosis not present

## 2022-11-23 DIAGNOSIS — I5032 Chronic diastolic (congestive) heart failure: Secondary | ICD-10-CM | POA: Diagnosis not present

## 2022-11-23 DIAGNOSIS — N281 Cyst of kidney, acquired: Secondary | ICD-10-CM | POA: Diagnosis not present

## 2022-12-06 ENCOUNTER — Telehealth: Payer: Self-pay | Admitting: Cardiology

## 2022-12-06 DIAGNOSIS — E7849 Other hyperlipidemia: Secondary | ICD-10-CM

## 2022-12-06 DIAGNOSIS — I2510 Atherosclerotic heart disease of native coronary artery without angina pectoris: Secondary | ICD-10-CM

## 2022-12-06 DIAGNOSIS — I10 Essential (primary) hypertension: Secondary | ICD-10-CM

## 2022-12-06 NOTE — Telephone Encounter (Signed)
Per Dr. Radford Pax, [facility] to order FLP and ALT prior to OV with me. Called patient with Dr. Theodosia Blender response. Placed order for lab work and made her an appointment to for lab work. Patient verbalized understanding.

## 2022-12-06 NOTE — Telephone Encounter (Signed)
Patient is requesting to have lipid panel done prior to her appointment with Dr. Radford Pax. Patient was a patient of Dr. Tamala Julian, and she has had lab work prior to their appointments in the past. Will send message to Dr. Radford Pax for orders for lab work and which labs she might want.

## 2022-12-06 NOTE — Telephone Encounter (Signed)
  Patient sent message via MyChart requesting to have her blood work done before her appointment with Dr Radford Pax on 01/31/23. Could orders be placed?

## 2022-12-06 NOTE — Addendum Note (Signed)
Addended by: Aris Georgia, Pharrah Rottman L on: 12/06/2022 10:57 AM   Modules accepted: Orders

## 2022-12-11 DIAGNOSIS — N1832 Chronic kidney disease, stage 3b: Secondary | ICD-10-CM | POA: Diagnosis not present

## 2022-12-11 DIAGNOSIS — N281 Cyst of kidney, acquired: Secondary | ICD-10-CM | POA: Diagnosis not present

## 2022-12-11 DIAGNOSIS — E211 Secondary hyperparathyroidism, not elsewhere classified: Secondary | ICD-10-CM | POA: Diagnosis not present

## 2022-12-11 DIAGNOSIS — R768 Other specified abnormal immunological findings in serum: Secondary | ICD-10-CM | POA: Diagnosis not present

## 2022-12-11 DIAGNOSIS — I5032 Chronic diastolic (congestive) heart failure: Secondary | ICD-10-CM | POA: Diagnosis not present

## 2022-12-11 DIAGNOSIS — E559 Vitamin D deficiency, unspecified: Secondary | ICD-10-CM | POA: Diagnosis not present

## 2022-12-11 DIAGNOSIS — R778 Other specified abnormalities of plasma proteins: Secondary | ICD-10-CM | POA: Diagnosis not present

## 2022-12-18 ENCOUNTER — Other Ambulatory Visit: Payer: Self-pay | Admitting: Interventional Cardiology

## 2022-12-26 ENCOUNTER — Other Ambulatory Visit: Payer: Self-pay | Admitting: Family Medicine

## 2022-12-26 DIAGNOSIS — M810 Age-related osteoporosis without current pathological fracture: Secondary | ICD-10-CM

## 2022-12-26 DIAGNOSIS — Z1231 Encounter for screening mammogram for malignant neoplasm of breast: Secondary | ICD-10-CM

## 2023-01-03 DIAGNOSIS — E7800 Pure hypercholesterolemia, unspecified: Secondary | ICD-10-CM | POA: Diagnosis not present

## 2023-01-03 DIAGNOSIS — H6120 Impacted cerumen, unspecified ear: Secondary | ICD-10-CM | POA: Diagnosis not present

## 2023-01-03 DIAGNOSIS — H6121 Impacted cerumen, right ear: Secondary | ICD-10-CM | POA: Diagnosis not present

## 2023-01-03 DIAGNOSIS — F39 Unspecified mood [affective] disorder: Secondary | ICD-10-CM | POA: Diagnosis not present

## 2023-01-03 DIAGNOSIS — Z0000 Encounter for general adult medical examination without abnormal findings: Secondary | ICD-10-CM | POA: Diagnosis not present

## 2023-01-03 DIAGNOSIS — N1832 Chronic kidney disease, stage 3b: Secondary | ICD-10-CM | POA: Diagnosis not present

## 2023-01-03 DIAGNOSIS — M810 Age-related osteoporosis without current pathological fracture: Secondary | ICD-10-CM | POA: Diagnosis not present

## 2023-01-03 DIAGNOSIS — Z23 Encounter for immunization: Secondary | ICD-10-CM | POA: Diagnosis not present

## 2023-01-18 ENCOUNTER — Telehealth: Payer: Self-pay | Admitting: Cardiology

## 2023-01-18 NOTE — Telephone Encounter (Signed)
Called patient to r/s appt with Dr. Radford Pax due to provider being out. She is now scheduled for 04/03 but wants to know if her lab work that is scheduled for this coming Monday 02/19 needs to rescheduled closer to appt date. Please advise.

## 2023-01-18 NOTE — Telephone Encounter (Signed)
Attempted to call patient. Unable to leave voicemail.  

## 2023-01-19 NOTE — Telephone Encounter (Signed)
Patient returned RN's call and wanted to report she rescheduled her lab appointment to 4/1.

## 2023-01-22 ENCOUNTER — Other Ambulatory Visit: Payer: Medicare PPO

## 2023-01-31 ENCOUNTER — Ambulatory Visit: Payer: Medicare PPO | Admitting: Cardiology

## 2023-02-06 DIAGNOSIS — H1045 Other chronic allergic conjunctivitis: Secondary | ICD-10-CM | POA: Diagnosis not present

## 2023-02-06 DIAGNOSIS — H43813 Vitreous degeneration, bilateral: Secondary | ICD-10-CM | POA: Diagnosis not present

## 2023-02-06 DIAGNOSIS — H0102B Squamous blepharitis left eye, upper and lower eyelids: Secondary | ICD-10-CM | POA: Diagnosis not present

## 2023-02-06 DIAGNOSIS — Z961 Presence of intraocular lens: Secondary | ICD-10-CM | POA: Diagnosis not present

## 2023-02-06 DIAGNOSIS — H0102A Squamous blepharitis right eye, upper and lower eyelids: Secondary | ICD-10-CM | POA: Diagnosis not present

## 2023-02-06 DIAGNOSIS — H26491 Other secondary cataract, right eye: Secondary | ICD-10-CM | POA: Diagnosis not present

## 2023-02-12 DIAGNOSIS — H6991 Unspecified Eustachian tube disorder, right ear: Secondary | ICD-10-CM | POA: Diagnosis not present

## 2023-03-05 ENCOUNTER — Ambulatory Visit: Payer: Medicare PPO | Attending: Cardiology

## 2023-03-05 DIAGNOSIS — E7849 Other hyperlipidemia: Secondary | ICD-10-CM | POA: Diagnosis not present

## 2023-03-05 DIAGNOSIS — I10 Essential (primary) hypertension: Secondary | ICD-10-CM | POA: Diagnosis not present

## 2023-03-05 DIAGNOSIS — I2510 Atherosclerotic heart disease of native coronary artery without angina pectoris: Secondary | ICD-10-CM

## 2023-03-06 ENCOUNTER — Telehealth: Payer: Self-pay

## 2023-03-06 DIAGNOSIS — E782 Mixed hyperlipidemia: Secondary | ICD-10-CM

## 2023-03-06 DIAGNOSIS — Z79899 Other long term (current) drug therapy: Secondary | ICD-10-CM

## 2023-03-06 LAB — ALT: ALT: 46 IU/L — ABNORMAL HIGH (ref 0–32)

## 2023-03-06 LAB — LIPID PANEL
Chol/HDL Ratio: 2.4 ratio (ref 0.0–4.4)
Cholesterol, Total: 126 mg/dL (ref 100–199)
HDL: 53 mg/dL (ref >39)
LDL Chol Calc (NIH): 55 mg/dL (ref 0–99)
Triglycerides: 93 mg/dL (ref 0–149)
VLDL Cholesterol Cal: 18 mg/dL (ref 5–40)

## 2023-03-06 NOTE — Telephone Encounter (Signed)
Called and reviewed stable cholesterol labs with patient who verbalized understanding. Reviewed mildly elevated ALT which can be caused by medications or lifestyle. She also reports she no longer takes allopurinol, removed this med from medication list. Patient states she does not drink alcohol but she does use tylenol (500 mg) about 4 times a week for "aches and pains". Patient agrees to repeat ALT and hepatic panel in 4 weeks, orders placed. Results forwarded to PCP.

## 2023-03-06 NOTE — Telephone Encounter (Signed)
-----   Message from Bernestine Amass, RN sent at 03/06/2023  9:06 AM EDT -----  ----- Message ----- From: Sueanne Margarita, MD Sent: 03/06/2023   8:59 AM EDT To: Caren Macadam, MD; Bernestine Amass, RN  Stable labs .  ALT mildly elevated.  Encouraged her to avoid alcohol and Tylenol.  Repeat ALT in [redacted] weeks along with hepatic panel- continue current meds and forward to PCP

## 2023-03-07 ENCOUNTER — Ambulatory Visit: Payer: Medicare PPO | Admitting: Cardiology

## 2023-03-12 ENCOUNTER — Encounter: Payer: Self-pay | Admitting: Cardiology

## 2023-03-22 ENCOUNTER — Other Ambulatory Visit: Payer: Self-pay

## 2023-03-22 MED ORDER — ROSUVASTATIN CALCIUM 10 MG PO TABS: 10.0000 mg | ORAL_TABLET | Freq: Every day | ORAL | 0 refills | Status: DC

## 2023-03-22 NOTE — Telephone Encounter (Signed)
Pt's medication was sent to pt's pharmacy as requested. Confirmation received.  °

## 2023-05-02 ENCOUNTER — Ambulatory Visit: Payer: Medicare PPO | Attending: Cardiology

## 2023-05-02 DIAGNOSIS — E782 Mixed hyperlipidemia: Secondary | ICD-10-CM

## 2023-05-02 DIAGNOSIS — Z79899 Other long term (current) drug therapy: Secondary | ICD-10-CM | POA: Diagnosis not present

## 2023-05-03 ENCOUNTER — Telehealth: Payer: Self-pay

## 2023-05-03 LAB — HEPATIC FUNCTION PANEL
ALT: 25 IU/L (ref 0–32)
AST: 27 IU/L (ref 0–40)
Albumin: 4.4 g/dL (ref 3.9–4.9)
Alkaline Phosphatase: 69 IU/L (ref 44–121)
Bilirubin Total: 0.8 mg/dL (ref 0.0–1.2)
Bilirubin, Direct: 0.23 mg/dL (ref 0.00–0.40)
Total Protein: 6.3 g/dL (ref 6.0–8.5)

## 2023-05-03 NOTE — Telephone Encounter (Signed)
Called patient to advise that liver function panel is normal, patient verbalized understanding. Patient asked if she can potentially get a sooner appt w/ Dr. Mayford Knife, she is currently scheduled for 05/24/23. Offered appt w/ APP, patient she can wait to discuss her questions about her statins w/ Dr. Mayford Knife.

## 2023-05-03 NOTE — Telephone Encounter (Signed)
-----   Message from Meriam Sprague, MD sent at 05/03/2023  8:20 AM EDT ----- Liver function is normal.

## 2023-05-14 DIAGNOSIS — Z79899 Other long term (current) drug therapy: Secondary | ICD-10-CM | POA: Diagnosis not present

## 2023-05-14 DIAGNOSIS — N1832 Chronic kidney disease, stage 3b: Secondary | ICD-10-CM | POA: Diagnosis not present

## 2023-05-14 DIAGNOSIS — I5032 Chronic diastolic (congestive) heart failure: Secondary | ICD-10-CM | POA: Diagnosis not present

## 2023-05-14 DIAGNOSIS — R809 Proteinuria, unspecified: Secondary | ICD-10-CM | POA: Diagnosis not present

## 2023-05-14 DIAGNOSIS — N189 Chronic kidney disease, unspecified: Secondary | ICD-10-CM | POA: Diagnosis not present

## 2023-05-16 LAB — LAB REPORT - SCANNED
Creatinine, POC: 258.2 mg/dL
EGFR: 27

## 2023-05-17 ENCOUNTER — Ambulatory Visit
Admission: RE | Admit: 2023-05-17 | Discharge: 2023-05-17 | Disposition: A | Discharge status: Home / Self Care | Payer: Medicare PPO | Source: Ambulatory Visit | Attending: Family Medicine | Admitting: Family Medicine

## 2023-05-17 DIAGNOSIS — Z1231 Encounter for screening mammogram for malignant neoplasm of breast: Secondary | ICD-10-CM

## 2023-05-17 DIAGNOSIS — M810 Age-related osteoporosis without current pathological fracture: Secondary | ICD-10-CM

## 2023-05-17 DIAGNOSIS — N19 Unspecified kidney failure: Secondary | ICD-10-CM | POA: Diagnosis not present

## 2023-05-17 DIAGNOSIS — Z90710 Acquired absence of both cervix and uterus: Secondary | ICD-10-CM | POA: Diagnosis not present

## 2023-05-17 DIAGNOSIS — E349 Endocrine disorder, unspecified: Secondary | ICD-10-CM | POA: Diagnosis not present

## 2023-05-17 DIAGNOSIS — N951 Menopausal and female climacteric states: Secondary | ICD-10-CM | POA: Diagnosis not present

## 2023-05-17 DIAGNOSIS — Z8262 Family history of osteoporosis: Secondary | ICD-10-CM | POA: Diagnosis not present

## 2023-05-18 ENCOUNTER — Other Ambulatory Visit (HOSPITAL_COMMUNITY): Payer: Self-pay | Admitting: Nephrology

## 2023-05-18 DIAGNOSIS — I129 Hypertensive chronic kidney disease with stage 1 through stage 4 chronic kidney disease, or unspecified chronic kidney disease: Secondary | ICD-10-CM | POA: Diagnosis not present

## 2023-05-18 DIAGNOSIS — I5032 Chronic diastolic (congestive) heart failure: Secondary | ICD-10-CM | POA: Diagnosis not present

## 2023-05-18 DIAGNOSIS — N1832 Chronic kidney disease, stage 3b: Secondary | ICD-10-CM | POA: Diagnosis not present

## 2023-05-18 DIAGNOSIS — N179 Acute kidney failure, unspecified: Secondary | ICD-10-CM | POA: Diagnosis not present

## 2023-05-18 DIAGNOSIS — N189 Chronic kidney disease, unspecified: Secondary | ICD-10-CM

## 2023-05-23 DIAGNOSIS — M810 Age-related osteoporosis without current pathological fracture: Secondary | ICD-10-CM | POA: Diagnosis not present

## 2023-05-23 DIAGNOSIS — F319 Bipolar disorder, unspecified: Secondary | ICD-10-CM | POA: Diagnosis not present

## 2023-05-23 DIAGNOSIS — N1832 Chronic kidney disease, stage 3b: Secondary | ICD-10-CM | POA: Diagnosis not present

## 2023-05-23 DIAGNOSIS — E7800 Pure hypercholesterolemia, unspecified: Secondary | ICD-10-CM | POA: Diagnosis not present

## 2023-05-23 DIAGNOSIS — G4700 Insomnia, unspecified: Secondary | ICD-10-CM | POA: Diagnosis not present

## 2023-05-24 ENCOUNTER — Encounter: Payer: Self-pay | Admitting: Cardiology

## 2023-05-24 ENCOUNTER — Ambulatory Visit: Payer: Medicare PPO | Attending: Cardiology | Admitting: Cardiology

## 2023-05-24 VITALS — BP 110/78 | HR 65 | Ht 63.0 in | Wt 136.2 lb

## 2023-05-24 DIAGNOSIS — I10 Essential (primary) hypertension: Secondary | ICD-10-CM

## 2023-05-24 DIAGNOSIS — R931 Abnormal findings on diagnostic imaging of heart and coronary circulation: Secondary | ICD-10-CM | POA: Insufficient documentation

## 2023-05-24 DIAGNOSIS — I2510 Atherosclerotic heart disease of native coronary artery without angina pectoris: Secondary | ICD-10-CM | POA: Diagnosis not present

## 2023-05-24 DIAGNOSIS — E7849 Other hyperlipidemia: Secondary | ICD-10-CM

## 2023-05-24 NOTE — Patient Instructions (Signed)
Medication Instructions:  Please stop your statin for 2 weeks. Note if there is any improvement in your joint/muscle aches. Then call our office to report your findings.  *If you need a refill on your cardiac medications before your next appointment, please call your pharmacy*   Lab Work: None.   If you have labs (blood work) drawn today and your tests are completely normal, you will receive your results only by: MyChart Message (if you have MyChart) OR A paper copy in the mail If you have any lab test that is abnormal or we need to change your treatment, we will call you to review the results.   Testing/Procedures: None.   Follow-Up: At [facility], you and your health needs are our priority.  As part of our continuing mission to provide you with exceptional heart care, we have created designated Provider Care Teams.  These Care Teams include your primary Cardiologist (physician) and Advanced Practice Providers (APPs -  Physician Assistants and Nurse Practitioners) who all work together to provide you with the care you need, when you need it.  We recommend signing up for the patient portal called "MyChart".  Sign up information is provided on this After Visit Summary.  MyChart is used to connect with patients for Virtual Visits (Telemedicine).  Patients are able to view lab/test results, encounter notes, upcoming appointments, etc.  Non-urgent messages can be sent to your provider as well.   To learn more about what you can do with MyChart, go to [email or web address].    Your next appointment:   1 year(s)  Provider:   Dr. Armanda Magic, MD

## 2023-05-24 NOTE — Progress Notes (Signed)
Cardiology Office Note:    Date:  05/24/2023   ID:  Samantha Conley, DOB 1951-02-15, MRN [id number]  PCP:  Aliene Beams, MD  Cardiologist:  Lesleigh Noe, MD (Inactive)   Referring MD: Aliene Beams, MD   Chief Complaint  Patient presents with   Follow-up    Coronary artery calcifications, hypertension, hyperlipidemia    History of Present Illness:    Samantha Conley is a 72 y.o. female with a family history of vascular disease, and personal history of hyperlipidemia (untreated), prior negative ischemic workup with stress echo 2017, diastolic left ventricular dysfunction, and CKD 3.  Mildly abnormal coronary calcium score 2019, 24 with no more than mild plaque in the proximal LAD.  She is here today for followup and is doing well.  She denies any chest pain or pressure, SOB, DOE, PND, orthopnea, LE edema, dizziness, palpitations or syncope. Occasionally she will have some indigestion that resolves with belching. She is compliant with her meds and is tolerating meds with no SE.    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 Recheck 2 years.     Past Surgical History:  Procedure Laterality Date   CATARACT EXTRACTION, BILATERAL     Lens implant bilaterally   CHOLECYSTECTOMY     COLONOSCOPY N/A 01/14/2018   NASAL SINUS SURGERY  2014   TONSILLECTOMY     TOTAL  ABDOMINAL HYSTERECTOMY     endometriosis, bleeding    Current Medications: Current Meds  Medication Sig   acetaminophen (TYLENOL) 500 MG tablet Take 500 mg by mouth every 6 (six) hours as needed for mild pain.     Allergies:   Lidocaine, Benadryl [diphenhydramine hcl], Codeine, Cortisone, Doxycycline, Morphine and codeine, Promethazine, and Rosuvastatin   Social History   Socioeconomic History   Marital status: Single    Spouse name: Not on file   Number of children: 0   Years of education: 20   Highest education level: Not on file  Occupational History   Occupation: retired    Associate Professor: [employer]    Comment: librarian  Tobacco Use   Smoking status: Never   Smokeless tobacco: Never  Vaping Use   Vaping Use: Never used  Substance and Sexual Activity   Alcohol use: No    Alcohol/week: 0.0 standard drinks of alcohol   Drug use: No   Sexual activity: Not Currently  Other Topics Concern   Not on file  Social History Narrative   Lives alone with two pets,   Never married   Two masters degrees   Worked for the state      Social Determinants of Health   Financial Resource Strain: Low Risk  (05/16/2021)   Overall Financial Resource Strain (CARDIA)    Difficulty  of Paying Living Expenses: Not hard at all  Food Insecurity: No Food Insecurity (05/16/2021)   Hunger Vital Sign    Worried About Running Out of Food in the Last Year: Never true    Ran Out of Food in the Last Year: Never true  Transportation Needs: No Transportation Needs (05/16/2021)   PRAPARE - Administrator, Civil Service (Medical): No    Lack of Transportation (Non-Medical): No  Physical Activity: Sufficiently Active (05/16/2021)   Exercise Vital Sign    Days of Exercise per Week: 7 days    Minutes of Exercise per Session: 40 min  Stress: No Stress Concern Present (05/16/2021)   [employer] of Occupational Health - Occupational Stress Questionnaire    Feeling of Stress : Only a little   Social Connections: Moderately Isolated (05/16/2021)   Social Connection and Isolation Panel [NHANES]    Frequency of Communication with Friends and Family: More than three times a week    Frequency of Social Gatherings with Friends and Family: Not on file    Attends Religious Services: More than 4 times per year    Active Member of [employer] or Organizations: No    Attends Engineer, structural: Never    Marital Status: Never married     Family History: The patient's family history includes CAD in her father and mother; CVA in her mother; Cancer in her brother, brother, father, and paternal grandmother; Heart disease in her father, maternal grandfather, mother, and paternal grandfather; Hyperlipidemia in her mother; Hypertension in her mother; Parkinsonism in her father; Stroke in her maternal grandmother and mother.  ROS:   Please see the history of present illness.    No all other systems reviewed and are negative.  EKGs/Labs/Other Studies Reviewed:    The following studies were reviewed today: Coronary calcium score 2019: IMPRESSION: 1. Coronary artery calcium score 24 Agatston units, placing the patient in the 66th percentile for age and gender. This suggests intermediate risk for future cardiac events.   2. Plaque throughout the proximal LAD. Probably no more than mild stenosis but will send for FFR to confirm.       Recent Labs: 05/02/2023: ALT 25  Recent Lipid Panel    Component Value Date/Time   CHOL 126 03/05/2023 1047   TRIG 93 03/05/2023 1047   HDL 53 03/05/2023 1047   CHOLHDL 2.4 03/05/2023 1047   CHOLHDL 3 02/21/2021 0920   VLDL 23.0 02/21/2021 0920   LDLCALC 55 03/05/2023 1047    Physical Exam:    VS:  BP 110/78   Pulse 65   Ht 5\' 3"  (1.6 m)   Wt 136 lb 3.2 oz (61.8 kg)   SpO2 98%   BMI 24.13 kg/m     Wt Readings from Last 3 Encounters:  05/24/23 136 lb 3.2 oz (61.8 kg)  01/24/22 145 lb 6.4 oz (66 kg)  02/28/21 147 lb 6.4 oz (66.9 kg)     GEN: Well nourished, well developed in no acute distress HEENT: Normal NECK: No JVD; No carotid bruits LYMPHATICS: No lymphadenopathy CARDIAC:RRR, no murmurs, rubs, gallops RESPIRATORY:  Clear to auscultation without rales, wheezing or rhonchi  ABDOMEN: Soft, non-tender, non-distended MUSCULOSKELETAL:  No edema; No deformity  SKIN: Warm and dry NEUROLOGIC:  Alert and oriented x 3 PSYCHIATRIC:  Normal affect  ASSESSMENT:    1. Coronary artery calcification seen on CT scan   2. Essential hypertension   3. Other hyperlipidemia   4. Agatston coronary artery calcium  score less than 100     PLAN:    In order of problems listed above:  Coronary artery calcifications -coronary Ca score 24 with mild plaque in prox LAD by coronary CTA in 2019 -She denies any anginal symptoms -Continue prescription drug management with Crestor 10 mg daily with as needed refills -she does not want to get on ASA  Hypertension -BP is controlled on no medical therapy and diet alone  Hyperlipidemia -LDL goal less than 70 due to coronary calcifications -I have personally reviewed and interpreted outside labs performed by patient's PCP which showed LDL 55 and HDL 53 on 03/05/2023   Medication Adjustments/Labs and Tests Ordered: Current medicines are reviewed at length with the patient today.  Concerns regarding medicines are outlined above.  Orders Placed This Encounter  Procedures   EKG 12-Lead   No orders of the defined types were placed in this encounter.   There are no Patient Instructions on file for this visit.   Signed, Armanda Magic, MD  05/24/2023 2:07 PM    [facility]

## 2023-05-29 ENCOUNTER — Ambulatory Visit (HOSPITAL_COMMUNITY)
Admission: RE | Admit: 2023-05-29 | Discharge: 2023-05-29 | Disposition: A | Discharge status: Home / Self Care | Payer: Medicare PPO | Source: Ambulatory Visit | Attending: Nephrology | Admitting: Nephrology

## 2023-05-29 DIAGNOSIS — N189 Chronic kidney disease, unspecified: Secondary | ICD-10-CM | POA: Insufficient documentation

## 2023-05-29 DIAGNOSIS — N281 Cyst of kidney, acquired: Secondary | ICD-10-CM | POA: Diagnosis not present

## 2023-06-08 ENCOUNTER — Encounter: Payer: Self-pay | Admitting: Cardiology

## 2023-06-08 DIAGNOSIS — M79604 Pain in right leg: Secondary | ICD-10-CM

## 2023-06-12 ENCOUNTER — Other Ambulatory Visit: Payer: Self-pay

## 2023-06-12 ENCOUNTER — Telehealth: Payer: Self-pay

## 2023-06-12 DIAGNOSIS — E782 Mixed hyperlipidemia: Secondary | ICD-10-CM

## 2023-06-12 NOTE — Telephone Encounter (Signed)
Patient states she looked up her copay with her insurance for PharmD lipid clinic. She states she was told it was $40, which is the same as her copay to see Dr. Mayford Knife. She states it will be September before she can afford to meet with lipid clinic but agrees to schedule the appointment ahead of time. Order placed.

## 2023-06-13 NOTE — Telephone Encounter (Signed)
Patient is following up requesting to speak with Alcario Drought, RN.   Patient scheduled for 8/28 with PharmD. She states she was told something could be worked out to cover the expense of her appointment  Patient states that she has trouble hearing and requests a MyChart message instead of a call back, if possible.

## 2023-06-18 DIAGNOSIS — E7800 Pure hypercholesterolemia, unspecified: Secondary | ICD-10-CM | POA: Diagnosis not present

## 2023-06-18 DIAGNOSIS — N1832 Chronic kidney disease, stage 3b: Secondary | ICD-10-CM | POA: Diagnosis not present

## 2023-06-18 DIAGNOSIS — I10 Essential (primary) hypertension: Secondary | ICD-10-CM | POA: Diagnosis not present

## 2023-06-19 DIAGNOSIS — N1832 Chronic kidney disease, stage 3b: Secondary | ICD-10-CM | POA: Diagnosis not present

## 2023-06-19 DIAGNOSIS — N2581 Secondary hyperparathyroidism of renal origin: Secondary | ICD-10-CM | POA: Diagnosis not present

## 2023-06-19 DIAGNOSIS — I129 Hypertensive chronic kidney disease with stage 1 through stage 4 chronic kidney disease, or unspecified chronic kidney disease: Secondary | ICD-10-CM | POA: Diagnosis not present

## 2023-06-19 DIAGNOSIS — N189 Chronic kidney disease, unspecified: Secondary | ICD-10-CM | POA: Diagnosis not present

## 2023-06-19 DIAGNOSIS — R319 Hematuria, unspecified: Secondary | ICD-10-CM | POA: Diagnosis not present

## 2023-06-19 DIAGNOSIS — I5032 Chronic diastolic (congestive) heart failure: Secondary | ICD-10-CM | POA: Diagnosis not present

## 2023-06-19 DIAGNOSIS — N179 Acute kidney failure, unspecified: Secondary | ICD-10-CM | POA: Diagnosis not present

## 2023-06-29 DIAGNOSIS — L309 Dermatitis, unspecified: Secondary | ICD-10-CM | POA: Diagnosis not present

## 2023-06-29 DIAGNOSIS — L237 Allergic contact dermatitis due to plants, except food: Secondary | ICD-10-CM | POA: Diagnosis not present

## 2023-07-17 DIAGNOSIS — N1832 Chronic kidney disease, stage 3b: Secondary | ICD-10-CM | POA: Diagnosis not present

## 2023-07-17 DIAGNOSIS — N281 Cyst of kidney, acquired: Secondary | ICD-10-CM | POA: Diagnosis not present

## 2023-07-23 DIAGNOSIS — N1832 Chronic kidney disease, stage 3b: Secondary | ICD-10-CM | POA: Diagnosis not present

## 2023-07-23 DIAGNOSIS — N2581 Secondary hyperparathyroidism of renal origin: Secondary | ICD-10-CM | POA: Diagnosis not present

## 2023-07-23 DIAGNOSIS — I5032 Chronic diastolic (congestive) heart failure: Secondary | ICD-10-CM | POA: Diagnosis not present

## 2023-07-31 ENCOUNTER — Other Ambulatory Visit: Payer: Self-pay | Admitting: Cardiology

## 2023-07-31 ENCOUNTER — Other Ambulatory Visit (HOSPITAL_COMMUNITY): Payer: Self-pay | Admitting: Nephrology

## 2023-07-31 DIAGNOSIS — I130 Hypertensive heart and chronic kidney disease with heart failure and stage 1 through stage 4 chronic kidney disease, or unspecified chronic kidney disease: Secondary | ICD-10-CM

## 2023-07-31 DIAGNOSIS — N1832 Chronic kidney disease, stage 3b: Secondary | ICD-10-CM

## 2023-07-31 DIAGNOSIS — N2581 Secondary hyperparathyroidism of renal origin: Secondary | ICD-10-CM

## 2023-08-01 ENCOUNTER — Ambulatory Visit: Payer: Medicare PPO

## 2023-08-15 ENCOUNTER — Other Ambulatory Visit (HOSPITAL_COMMUNITY): Payer: Self-pay | Admitting: Student

## 2023-08-15 DIAGNOSIS — N189 Chronic kidney disease, unspecified: Secondary | ICD-10-CM

## 2023-08-16 ENCOUNTER — Other Ambulatory Visit: Payer: Self-pay

## 2023-08-16 ENCOUNTER — Encounter (HOSPITAL_COMMUNITY): Payer: Self-pay

## 2023-08-16 ENCOUNTER — Ambulatory Visit (HOSPITAL_COMMUNITY)
Admission: RE | Admit: 2023-08-16 | Discharge: 2023-08-16 | Disposition: A | Discharge status: Home / Self Care | Payer: Medicare PPO | Source: Ambulatory Visit | Attending: Nephrology | Admitting: Nephrology

## 2023-08-16 ENCOUNTER — Other Ambulatory Visit (HOSPITAL_COMMUNITY): Payer: Self-pay | Admitting: Nephrology

## 2023-08-16 DIAGNOSIS — R809 Proteinuria, unspecified: Secondary | ICD-10-CM | POA: Insufficient documentation

## 2023-08-16 DIAGNOSIS — N179 Acute kidney failure, unspecified: Secondary | ICD-10-CM

## 2023-08-16 DIAGNOSIS — I129 Hypertensive chronic kidney disease with stage 1 through stage 4 chronic kidney disease, or unspecified chronic kidney disease: Secondary | ICD-10-CM | POA: Diagnosis not present

## 2023-08-16 DIAGNOSIS — N1832 Chronic kidney disease, stage 3b: Secondary | ICD-10-CM

## 2023-08-16 DIAGNOSIS — N2889 Other specified disorders of kidney and ureter: Secondary | ICD-10-CM | POA: Diagnosis not present

## 2023-08-16 DIAGNOSIS — N189 Chronic kidney disease, unspecified: Secondary | ICD-10-CM

## 2023-08-16 DIAGNOSIS — N183 Chronic kidney disease, stage 3 unspecified: Secondary | ICD-10-CM | POA: Diagnosis not present

## 2023-08-16 DIAGNOSIS — N2581 Secondary hyperparathyroidism of renal origin: Secondary | ICD-10-CM

## 2023-08-16 DIAGNOSIS — I130 Hypertensive heart and chronic kidney disease with heart failure and stage 1 through stage 4 chronic kidney disease, or unspecified chronic kidney disease: Secondary | ICD-10-CM

## 2023-08-16 LAB — CBC
HCT: 37.6 % (ref 36.0–46.0)
Hemoglobin: 12.7 g/dL (ref 12.0–15.0)
MCH: 31.9 pg (ref 26.0–34.0)
MCHC: 33.8 g/dL (ref 30.0–36.0)
MCV: 94.5 fL (ref 80.0–100.0)
Platelets: 155 10*3/uL (ref 150–400)
RBC: 3.98 MIL/uL (ref 3.87–5.11)
RDW: 12.3 % (ref 11.5–15.5)
WBC: 4.3 10*3/uL (ref 4.0–10.5)
nRBC: 0 % (ref 0.0–0.2)

## 2023-08-16 LAB — PROTIME-INR
INR: 1 (ref 0.8–1.2)
Prothrombin Time: 13.2 s (ref 11.4–15.2)

## 2023-08-16 MED ORDER — GELATIN ABSORBABLE 12-7 MM EX MISC
1.0000 | Freq: Once | CUTANEOUS | Status: AC
  Administered 2023-08-16: 1 via TOPICAL

## 2023-08-16 MED ORDER — FENTANYL CITRATE (PF) 100 MCG/2ML IJ SOLN
INTRAMUSCULAR | Status: AC
  Filled 2023-08-16: qty 4

## 2023-08-16 MED ORDER — MIDAZOLAM HCL 2 MG/2ML IJ SOLN
INTRAMUSCULAR | Status: AC
  Filled 2023-08-16: qty 4

## 2023-08-16 MED ORDER — FENTANYL CITRATE (PF) 100 MCG/2ML IJ SOLN
INTRAMUSCULAR | Status: AC | PRN
  Administered 2023-08-16 (×2): 25 ug via INTRAVENOUS

## 2023-08-16 MED ORDER — TETRACAINE HCL 1 % IJ SOLN
10.0000 mg | Freq: Once | INTRAMUSCULAR | Status: AC
  Administered 2023-08-16: 10 mg via INTRADERMAL

## 2023-08-16 MED ORDER — SODIUM CHLORIDE 0.9 % IV SOLN: INTRAVENOUS | Status: DC

## 2023-08-16 MED ORDER — MIDAZOLAM HCL 2 MG/2ML IJ SOLN
INTRAMUSCULAR | Status: AC | PRN
  Administered 2023-08-16: .5 mg via INTRAVENOUS
  Administered 2023-08-16: 0.5 mg via INTRAVENOUS

## 2023-08-16 NOTE — H&P (Signed)
Chief Complaint: Patient was seen in consultation today for Random renal biopsy at the request of Bhutani,Manpreet S  Referring Physician(s): Bhutani,Manpreet S  Supervising Physician: Gilmer Mor  Patient Status: [facility] - Out-pt  History of Present Illness: Samantha Conley is a 72 y.o. female   FULL Code status per pt Chronic kidney disease Probable acute tubular injury Proteinuria ; HTN Follows in [place] with Dr Wolfgang Phoenix  Scheduled for random renal biopsy   Past Medical History:  Diagnosis Date   Agatston coronary artery calcium score less than 100    24 with mild plaque in the proximal LAD   Allergy    Anemia    Anxiety    Arthritis    back   Bipolar disorder (HCC)    Bipolar disorder, unspecified (HCC) 04/19/2014   Cataract    chemical imbalance 12/14/2011   Chronic kidney disease (CKD), stage III (moderate) (HCC)    Dehydration 04/25/2014   Dehydration, severe 03/12/2014   Depression    Diarrhea 03/12/2014   Essential hypertension    GERD (gastroesophageal reflux disease)    Gout    HLD (hyperlipidemia) 04/27/2017   Hypercalcemia 03/12/2014   Insomnia    Lithium toxicity    Osteoarthritis - DJD cervical, thoracic, lumbar 02/24/2020   Osteopenia 05/06/2019   DEXA: 05/2019: lowest T = -2.3 at left femur. Recheck 2 years.     Past Surgical History:  Procedure Laterality Date   CATARACT EXTRACTION, BILATERAL     Lens implant bilaterally   CHOLECYSTECTOMY     COLONOSCOPY N/A 01/14/2018   NASAL SINUS SURGERY  2014   TONSILLECTOMY     TOTAL ABDOMINAL HYSTERECTOMY     endometriosis, bleeding    Allergies: Lidocaine, Benadryl [diphenhydramine hcl], Codeine, Cortisone, Doxycycline, Morphine and codeine, Promethazine, and Rosuvastatin  Medications: Prior to Admission medications   Medication Sig Start Date End Date Taking? Authorizing Provider  acetaminophen (TYLENOL) 500 MG tablet Take 500 mg by mouth every 6 (six) hours as needed for mild  pain.    [provider]  citalopram (CELEXA) 10 MG tablet Take 5 mg by mouth daily.    [provider]  divalproex (DEPAKOTE ER) 250 MG 24 hr tablet Take 250 mg by mouth 2 (two) times daily.    [provider]  doxepin (SINEQUAN) 50 MG capsule Take 50 mg by mouth at bedtime.    [provider]  polyethylene glycol (MIRALAX / GLYCOLAX) packet Take 17 g by mouth daily as needed for mild constipation.    [provider]  rosuvastatin (CRESTOR) 10 MG tablet TAKE 1 TABLET BY MOUTH DAILY. 08/01/23   Quintella Reichert, MD     Family History  Problem Relation Age of Onset   CAD Mother    CVA Mother    Heart disease Mother    Hyperlipidemia Mother    Hypertension Mother    Stroke Mother    CAD Father    Parkinsonism Father    Cancer Father        melanoma   Heart disease Father    Cancer Brother        prostate   Cancer Brother        melanoma   Stroke Maternal Grandmother    Heart disease Maternal Grandfather    Cancer Paternal Grandmother        colon   Heart disease Paternal Grandfather     Social History   Socioeconomic History   Marital status: Single  Spouse name: Not on file   Number of children: 0   Years of education: 20   Highest education level: Not on file  Occupational History   Occupation: retired    Associate Professor: [employer]    Comment: librarian  Tobacco Use   Smoking status: Never   Smokeless tobacco: Never  Vaping Use   Vaping status: Never Used  Substance and Sexual Activity   Alcohol use: No    Alcohol/week: 0.0 standard drinks of alcohol   Drug use: No   Sexual activity: Not Currently  Other Topics Concern   Not on file  Social History Narrative   Lives alone with two pets,   Never married   Two masters degrees   Worked for the state      Social Determinants of Health   Financial Resource Strain: Low Risk  (05/16/2021)   Overall Financial Resource Strain (CARDIA)    Difficulty of Paying Living  Expenses: Not hard at all  Food Insecurity: No Food Insecurity (05/16/2021)   Hunger Vital Sign    Worried About Running Out of Food in the Last Year: Never true    Ran Out of Food in the Last Year: Never true  Transportation Needs: No Transportation Needs (05/16/2021)   PRAPARE - Administrator, Civil Service (Medical): No    Lack of Transportation (Non-Medical): No  Physical Activity: Sufficiently Active (05/16/2021)   Exercise Vital Sign    Days of Exercise per Week: 7 days    Minutes of Exercise per Session: 40 min  Stress: No Stress Concern Present (05/16/2021)   [employer] of Occupational Health - Occupational Stress Questionnaire    Feeling of Stress : Only a little  Social Connections: Unknown (10/18/2022)   Received from [facility]   Social Network    Social Network: Not on file    Review of Systems: A 12 point ROS discussed and pertinent positives are indicated in the HPI above.  All other systems are negative.  Review of Systems  Constitutional:  Negative for activity change, fatigue and fever.  Respiratory:  Negative for cough and shortness of breath.   Cardiovascular:  Negative for chest pain.  Gastrointestinal:  Negative for abdominal pain.  Psychiatric/Behavioral:  Negative for behavioral problems and confusion.     Vital Signs: There were no vitals taken for this visit.  Advance Care Plan: The advanced care plan/surrogate decision maker was discussed at the time of visit and documented in the medical record.    Physical Exam Vitals reviewed.  HENT:     Mouth/Throat:     Mouth: Mucous membranes are moist.  Cardiovascular:     Rate and Rhythm: Normal rate and regular rhythm.     Heart sounds: Normal heart sounds.  Pulmonary:     Effort: Pulmonary effort is normal.     Breath sounds: Normal breath sounds.  Abdominal:     Palpations: Abdomen is soft.  Musculoskeletal:        General: Normal range of motion.  Skin:    General: Skin is  warm.  Neurological:     Mental Status: She is alert and oriented to person, place, and time.  Psychiatric:        Behavior: Behavior normal.     Imaging: No results found.  Labs:  CBC: No results for input(s): "WBC", "HGB", "HCT", "PLT" in the last 8760 hours.  COAGS: No results for input(s): "INR", "APTT" in the last 8760 hours.  BMP:  No results for input(s): "NA", "K", "CL", "CO2", "GLUCOSE", "BUN", "CALCIUM", "CREATININE", "GFRNONAA", "GFRAA" in the last 8760 hours.  Invalid input(s): "CMP"  LIVER FUNCTION TESTS: Recent Labs    03/05/23 1047 05/02/23 1346  BILITOT  --  0.8  AST  --  27  ALT 46* 25  ALKPHOS  --  69  PROT  --  6.3  ALBUMIN  --  4.4    TUMOR MARKERS: No results for input(s): "AFPTM", "CEA", "CA199", "CHROMGRNA" in the last 8760 hours.  Assessment and Plan:  Scheduled for random renal biopsy Risks and benefits of random renal biopsy was discussed with the patient and/or patient's family including, but not limited to bleeding, infection, damage to adjacent structures or low yield requiring additional tests.  All of the questions were answered and there is agreement to proceed.  Consent signed and in chart.  Thank you for this interesting consult.  I greatly enjoyed meeting Samantha Conley and look forward to participating in their care.  A copy of this report was sent to the requesting provider on this date.  Electronically Signed: Robet Leu, PA-C 08/16/2023, 8:09 AM   I spent a total of  30 Minutes   in face to face in clinical consultation, greater than 50% of which was counseling/coordinating care for random renal biopsy

## 2023-08-16 NOTE — Procedures (Signed)
Interventional Radiology Procedure Note  Procedure: US guided biopsy of left kidney, medical renal biopsy.  Complications: None EBL: None Sample: to pathology Recommendations: - Bedrest 2 hours.   - Routine wound care - Follow up pathology - Advance diet   Signed,  Yvone Neu. Loreta Ave, DO, ABVM, RPVI

## 2023-08-20 ENCOUNTER — Encounter (HOSPITAL_COMMUNITY): Payer: Self-pay

## 2023-08-21 LAB — SURGICAL PATHOLOGY

## 2023-08-28 DIAGNOSIS — I129 Hypertensive chronic kidney disease with stage 1 through stage 4 chronic kidney disease, or unspecified chronic kidney disease: Secondary | ICD-10-CM | POA: Diagnosis not present

## 2023-08-28 DIAGNOSIS — E7800 Pure hypercholesterolemia, unspecified: Secondary | ICD-10-CM | POA: Diagnosis not present

## 2023-08-28 DIAGNOSIS — Z9889 Other specified postprocedural states: Secondary | ICD-10-CM | POA: Diagnosis not present

## 2023-08-28 DIAGNOSIS — G720 Drug-induced myopathy: Secondary | ICD-10-CM | POA: Diagnosis not present

## 2023-08-28 DIAGNOSIS — N1832 Chronic kidney disease, stage 3b: Secondary | ICD-10-CM | POA: Diagnosis not present

## 2023-09-27 ENCOUNTER — Other Ambulatory Visit: Payer: Self-pay | Admitting: Adult Health

## 2023-09-27 DIAGNOSIS — Z1231 Encounter for screening mammogram for malignant neoplasm of breast: Secondary | ICD-10-CM

## 2023-09-27 DIAGNOSIS — Z780 Asymptomatic menopausal state: Secondary | ICD-10-CM

## 2023-10-04 ENCOUNTER — Emergency Department (HOSPITAL_COMMUNITY): Payer: Medicare PPO

## 2023-10-04 ENCOUNTER — Other Ambulatory Visit: Payer: Self-pay

## 2023-10-04 ENCOUNTER — Encounter (HOSPITAL_COMMUNITY): Payer: Self-pay

## 2023-10-04 ENCOUNTER — Emergency Department (HOSPITAL_COMMUNITY)
Admission: EM | Admit: 2023-10-04 | Discharge: 2023-10-04 | Disposition: A | Discharge status: Home / Self Care | Payer: Medicare PPO | Attending: Student | Admitting: Student

## 2023-10-04 DIAGNOSIS — Y93H2 Activity, gardening and landscaping: Secondary | ICD-10-CM | POA: Insufficient documentation

## 2023-10-04 DIAGNOSIS — I672 Cerebral atherosclerosis: Secondary | ICD-10-CM | POA: Diagnosis not present

## 2023-10-04 DIAGNOSIS — I129 Hypertensive chronic kidney disease with stage 1 through stage 4 chronic kidney disease, or unspecified chronic kidney disease: Secondary | ICD-10-CM | POA: Diagnosis not present

## 2023-10-04 DIAGNOSIS — W1830XA Fall on same level, unspecified, initial encounter: Secondary | ICD-10-CM | POA: Diagnosis not present

## 2023-10-04 DIAGNOSIS — S0990XA Unspecified injury of head, initial encounter: Secondary | ICD-10-CM | POA: Diagnosis not present

## 2023-10-04 DIAGNOSIS — I6529 Occlusion and stenosis of unspecified carotid artery: Secondary | ICD-10-CM | POA: Diagnosis not present

## 2023-10-04 DIAGNOSIS — S199XXA Unspecified injury of neck, initial encounter: Secondary | ICD-10-CM | POA: Diagnosis not present

## 2023-10-04 DIAGNOSIS — N189 Chronic kidney disease, unspecified: Secondary | ICD-10-CM | POA: Diagnosis not present

## 2023-10-04 NOTE — Discharge Instructions (Addendum)
Today you were seen for injury to the head. Thank you for letting us treat you today. After performing a physical exam reviewing your imaging, I feel you are safe to go home. Please follow up with your PCP in the next several days and provide them with your records from this visit. Return to the Emergency Room if pain becomes severe or symptoms worsen.

## 2023-10-04 NOTE — ED Triage Notes (Addendum)
Pt fell while working in her garden today and hit her head. Pt c/o knot to back of bed. No blood thinners, no LOC.

## 2023-10-04 NOTE — ED Provider Notes (Signed)
[place] EMERGENCY DEPARTMENT AT [facility] Provider Note   CSN: [id number] Arrival date & time: 10/04/23  2006     History  Chief Complaint  Patient presents with   Marletta Lor    Samantha Conley is a 72 y.o. female past medical history significant for hypertension, gout, chronic renal failure presents today for f head injury.  Patient states that while she was working in her garden she lost her balance and hit her head on brick.  She denies loss of consciousness or blood thinner use.  Patient endorses mild headache, tenderness, small knot where she had her head.  Patient denies loss of bowel or bladder, dizziness, pain anywhere else, hearing or vision changes, nausea, or vomiting.   Fall Associated symptoms include headaches.       Home Medications Prior to Admission medications   Medication Sig Start Date End Date Taking? Authorizing Provider  acetaminophen (TYLENOL) 500 MG tablet Take 500 mg by mouth every 6 (six) hours as needed for mild pain.    [provider]  citalopram (CELEXA) 10 MG tablet Take 5 mg by mouth daily.    [provider]  divalproex (DEPAKOTE ER) 250 MG 24 hr tablet Take 250 mg by mouth 2 (two) times daily.    [provider]  doxepin (SINEQUAN) 50 MG capsule Take 50 mg by mouth at bedtime.    [provider]  polyethylene glycol (MIRALAX / GLYCOLAX) packet Take 17 g by mouth daily as needed for mild constipation.    [provider]  rosuvastatin (CRESTOR) 10 MG tablet TAKE 1 TABLET BY MOUTH DAILY. 08/01/23   Quintella Reichert, MD      Allergies    Lidocaine, Benadryl [diphenhydramine hcl], Codeine, Cortisone, Doxycycline, Morphine and codeine, Promethazine, and Rosuvastatin    Review of Systems   Review of Systems  Neurological:  Positive for headaches.    Physical Exam Updated Vital Signs BP (!) 152/86   Pulse 82   Temp 98.6 F (37 C) (Oral)   Resp 18   SpO2 100%  Physical Exam Vitals and  nursing note reviewed.  Constitutional:      General: She is not in acute distress.    Appearance: She is well-developed.  HENT:     Head: Normocephalic. No raccoon eyes, Battle's sign or laceration.     Jaw: There is normal jaw occlusion.      Comments: Patient has mild swelling in approximately 2-1/2 cm area of erythema on her scalp from where she hit her head.    Right Ear: External ear normal.     Left Ear: External ear normal.  Eyes:     General: Vision grossly intact.     Extraocular Movements: Extraocular movements intact.     Conjunctiva/sclera: Conjunctivae normal.  Neck:     Comments: No step-offs or bony abnormalities palpated Cardiovascular:     Rate and Rhythm: Normal rate and regular rhythm.     Heart sounds: No murmur heard. Pulmonary:     Effort: Pulmonary effort is normal. No respiratory distress.     Breath sounds: Normal breath sounds.  Abdominal:     Palpations: Abdomen is soft.     Tenderness: There is no abdominal tenderness.  Musculoskeletal:        General: No swelling.     Cervical back: Full passive range of motion without pain, normal range of motion and neck supple. No rigidity or crepitus. No pain with movement or spinous process  tenderness.  Skin:    General: Skin is warm and dry.     Capillary Refill: Capillary refill takes less than 2 seconds.  Neurological:     General: No focal deficit present.     Mental Status: She is alert and oriented to person, place, and time.     GCS: GCS eye subscore is 4. GCS verbal subscore is 5. GCS motor subscore is 6.     Motor: No weakness.  Psychiatric:        Mood and Affect: Mood normal.     ED Results / Procedures / Treatments   Labs (all labs ordered are listed, but only abnormal results are displayed) Labs Reviewed - No data to display  EKG None  Radiology No results found.  Procedures Procedures    Medications Ordered in ED Medications - No data to display  ED Course/ Medical Decision  Making/ A&P                                 Medical Decision Making Amount and/or Complexity of Data Reviewed Radiology: ordered.   This patient presents to the ED with chief complaint(s) of head injury with pertinent past medical history of chronic kidney disease which further complicates the presenting complaint. The complaint involves an extensive differential diagnosis and also carries with it a high risk of complications and morbidity.    The differential diagnosis includes hematoma, brain bleed, concussion, musculoskeletal pain   ED Course and Reassessment:   Independent visualization of imaging: - I independently visualized the following imaging with scope of interpretation limited to determining acute life threatening conditions related to emergency care: CT C-spine without contrast and head CT without contrast, which revealed pending  Consultation: - Consulted or discussed management/test interpretation w/ external professional: None  Patient signed out to Dr. Judd Lien at shift change         Final Clinical Impression(s) / ED Diagnoses Final diagnoses:  None    Rx / DC Orders ED Discharge Orders     None         Dolphus Jenny, PA-C 10/04/23 2306    Glendora Score, MD 10/05/23 [phone]

## 2023-10-10 DIAGNOSIS — N1832 Chronic kidney disease, stage 3b: Secondary | ICD-10-CM | POA: Diagnosis not present

## 2023-10-10 DIAGNOSIS — H8110 Benign paroxysmal vertigo, unspecified ear: Secondary | ICD-10-CM | POA: Diagnosis not present

## 2023-10-10 DIAGNOSIS — Z23 Encounter for immunization: Secondary | ICD-10-CM | POA: Diagnosis not present

## 2023-10-10 DIAGNOSIS — F3170 Bipolar disorder, currently in remission, most recent episode unspecified: Secondary | ICD-10-CM | POA: Diagnosis not present

## 2023-11-22 DIAGNOSIS — G4700 Insomnia, unspecified: Secondary | ICD-10-CM | POA: Diagnosis not present

## 2023-11-22 DIAGNOSIS — F319 Bipolar disorder, unspecified: Secondary | ICD-10-CM | POA: Diagnosis not present

## 2023-12-16 IMAGING — MG MM DIGITAL SCREENING BILAT W/ TOMO AND CAD
8 series · 8 of 24 positions shown · non-contrast
Comparison: Previous exam(s).

CLINICAL DATA: Screening.

EXAM:
DIGITAL SCREENING BILATERAL MAMMOGRAM WITH TOMOSYNTHESIS AND CAD
TECHNIQUE: Bilateral screening digital craniocaudal and mediolateral oblique
mammograms were obtained. Bilateral screening digital breast
tomosynthesis was performed. The images were evaluated with
computer-aided detection.

[L MLO synth-2D]
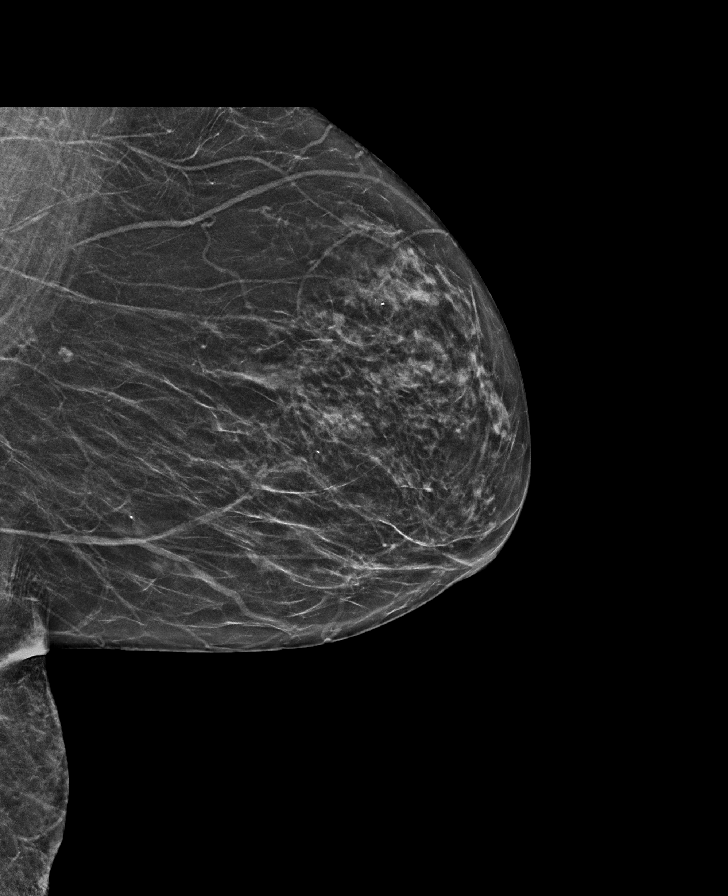

[R CC synth-2D]
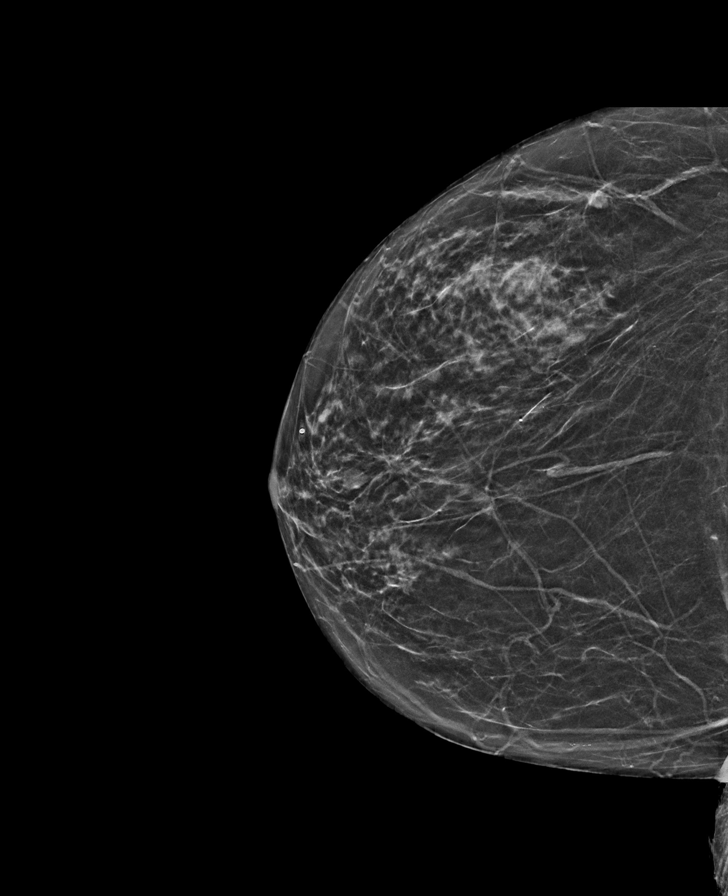

[R MLO synth-2D]
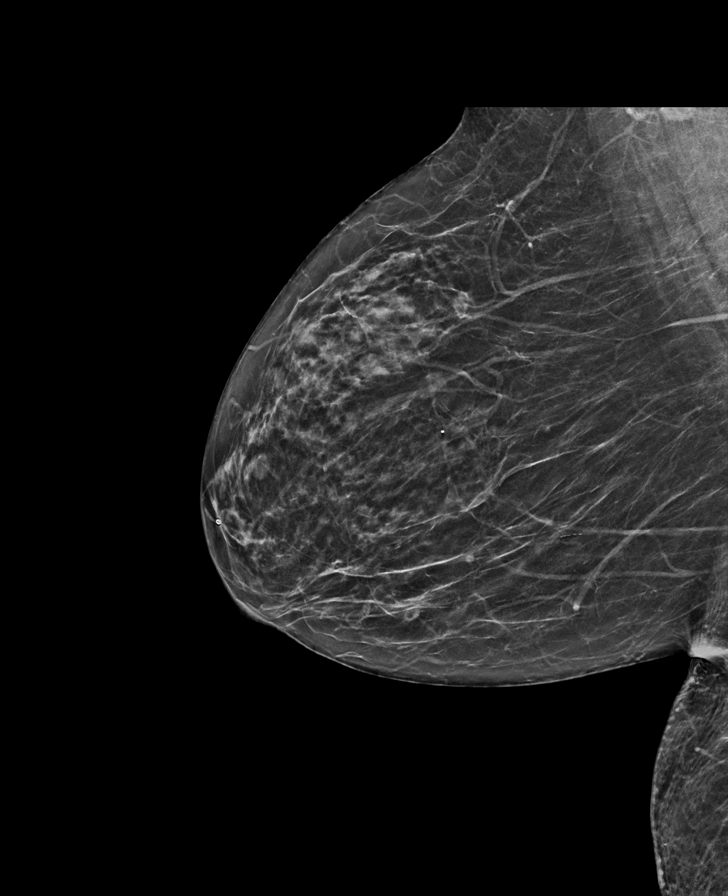

[L CC synth-2D]
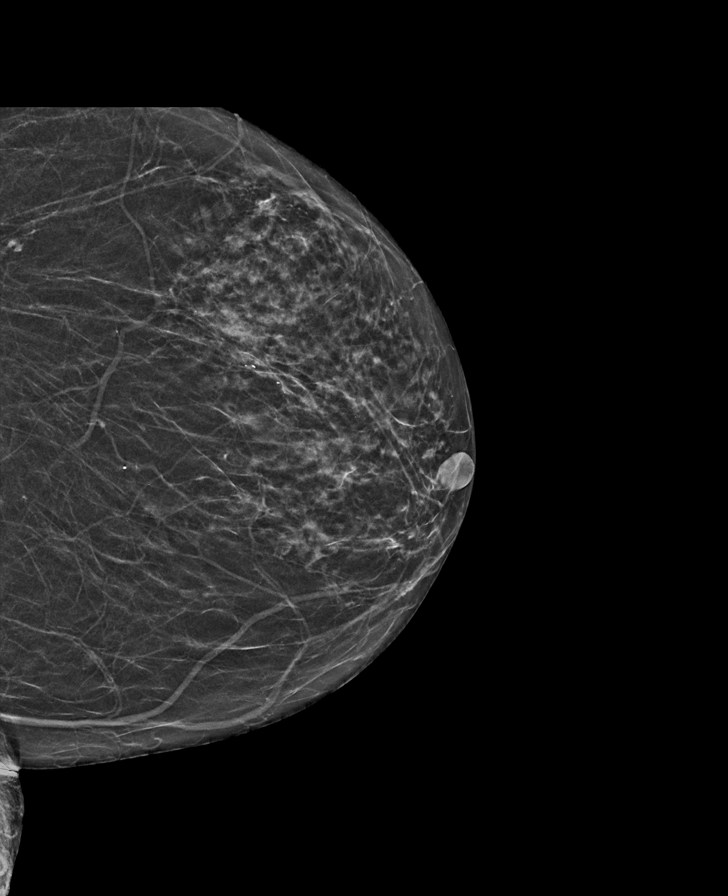

[L MLO tomo · tomo slice 27/53.0]
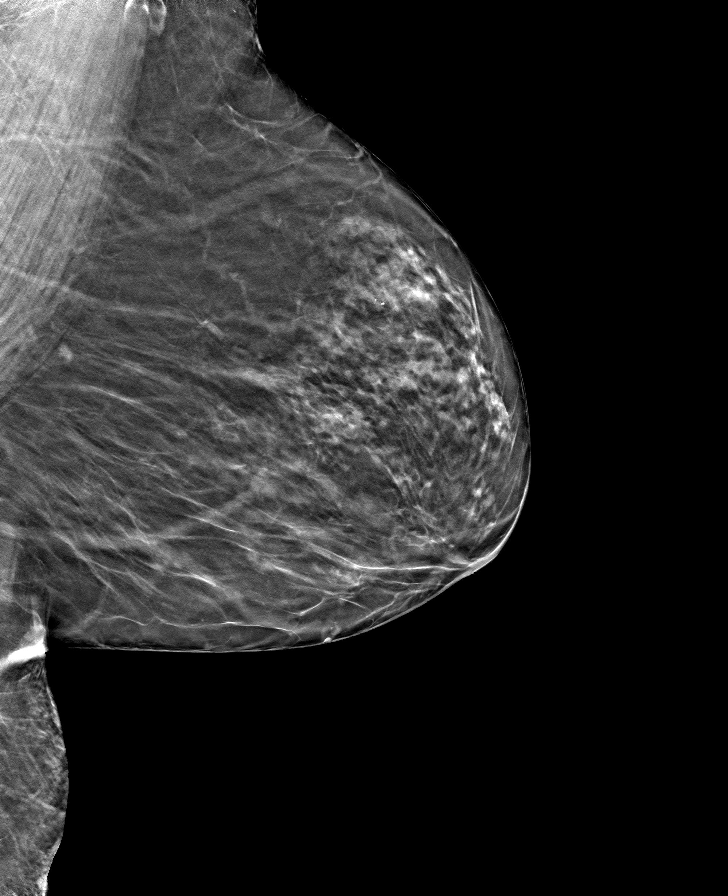

[R CC tomo · tomo slice 25/48.0]
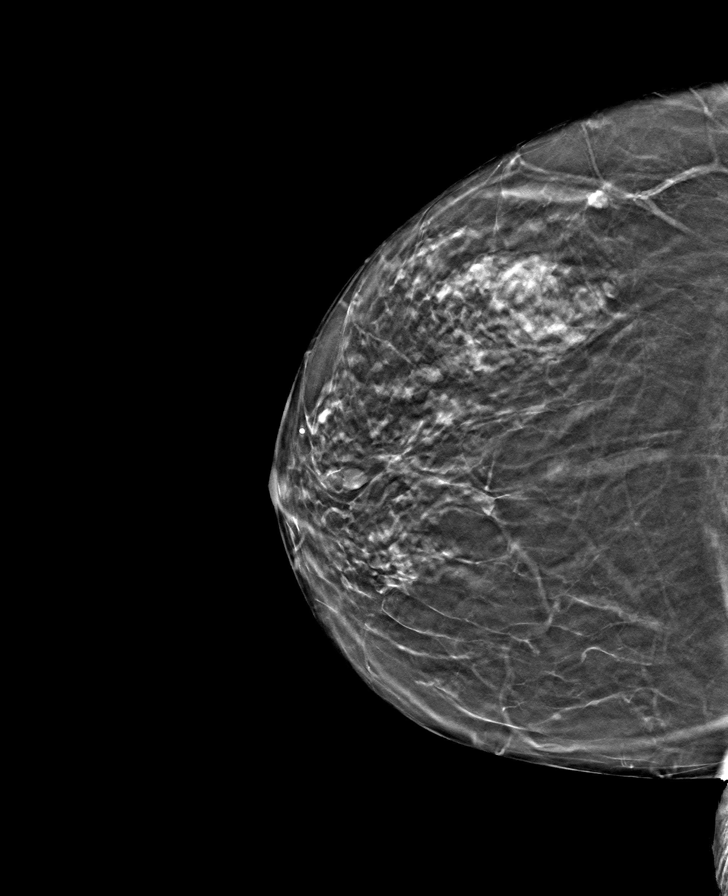

[R MLO tomo · tomo slice 28/55.0]
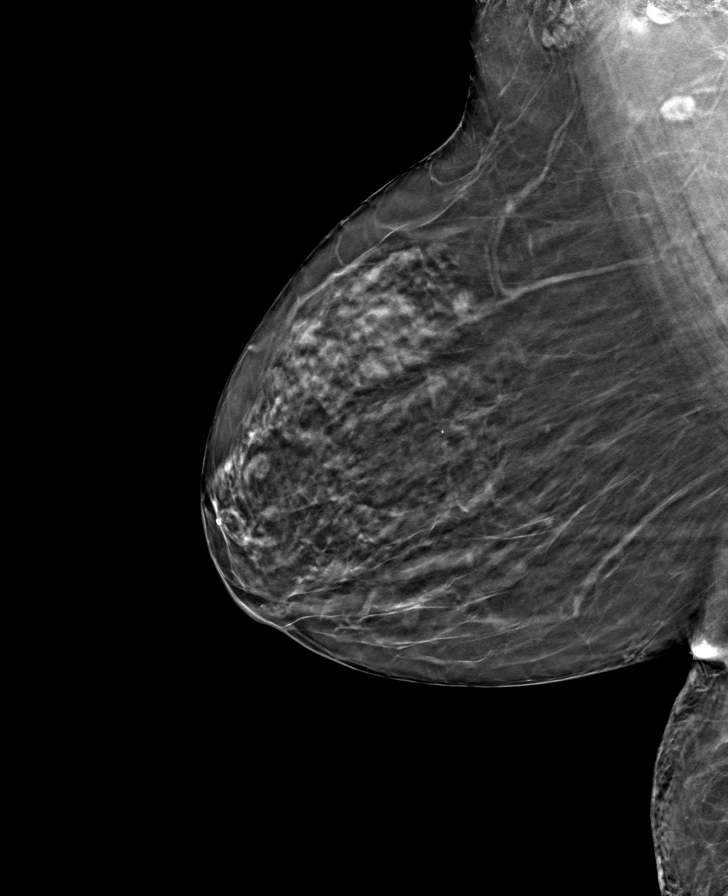

[L CC tomo · tomo slice 23/44.0]
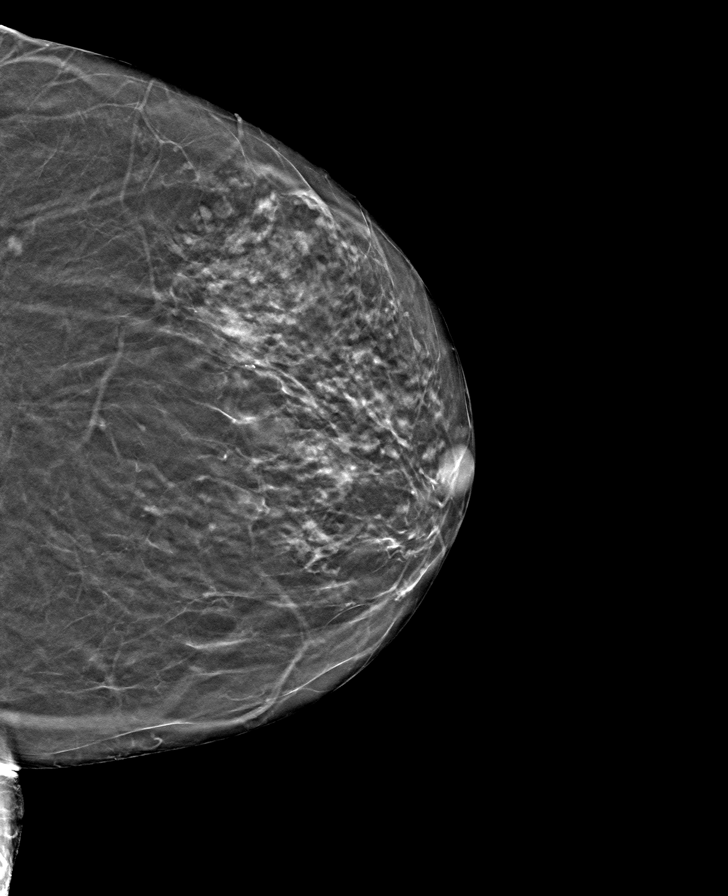

[8 of 24 positions shown; findings below may reference images not displayed]

ACR Breast Density Category b: There are scattered areas of
fibroglandular density.
FINDINGS: There are no findings suspicious for malignancy.
IMPRESSION: No mammographic evidence of malignancy. A result letter of this
screening mammogram will be mailed directly to the patient.

RECOMMENDATION:
Screening mammogram in one year. (Code:51-O-LD2)

BI-RADS CATEGORY  1: Negative.

## 2023-12-27 DIAGNOSIS — I129 Hypertensive chronic kidney disease with stage 1 through stage 4 chronic kidney disease, or unspecified chronic kidney disease: Secondary | ICD-10-CM | POA: Diagnosis not present

## 2023-12-27 DIAGNOSIS — K219 Gastro-esophageal reflux disease without esophagitis: Secondary | ICD-10-CM | POA: Diagnosis not present

## 2023-12-27 DIAGNOSIS — N1832 Chronic kidney disease, stage 3b: Secondary | ICD-10-CM | POA: Diagnosis not present

## 2023-12-27 DIAGNOSIS — E785 Hyperlipidemia, unspecified: Secondary | ICD-10-CM | POA: Diagnosis not present

## 2024-01-14 DIAGNOSIS — N1832 Chronic kidney disease, stage 3b: Secondary | ICD-10-CM | POA: Diagnosis not present

## 2024-01-14 LAB — LAB REPORT - SCANNED: EGFR: 23

## 2024-01-15 ENCOUNTER — Telehealth: Payer: Self-pay | Admitting: Cardiology

## 2024-01-15 NOTE — Telephone Encounter (Signed)
Patient is requesting call back to discuss labs that she would like to have prior to appt on 06/05

## 2024-01-18 NOTE — Telephone Encounter (Signed)
Returned patient's call regarding lab orders to be done prior to next appointment. No answer. Left detailed message per DPR asking patient to call our office or send MyChart message to discuss. MyChart message also sent to patient.

## 2024-01-21 ENCOUNTER — Telehealth: Payer: Self-pay

## 2024-01-21 DIAGNOSIS — Z79899 Other long term (current) drug therapy: Secondary | ICD-10-CM

## 2024-01-21 DIAGNOSIS — E782 Mixed hyperlipidemia: Secondary | ICD-10-CM

## 2024-01-21 NOTE — Telephone Encounter (Signed)
 Orders for FLP and ALT placed and released, to be completed May 2025 per patient request. MyChart message to patient.

## 2024-02-08 DIAGNOSIS — H16223 Keratoconjunctivitis sicca, not specified as Sjogren's, bilateral: Secondary | ICD-10-CM | POA: Diagnosis not present

## 2024-02-27 DIAGNOSIS — M79606 Pain in leg, unspecified: Secondary | ICD-10-CM | POA: Diagnosis not present

## 2024-02-27 DIAGNOSIS — G720 Drug-induced myopathy: Secondary | ICD-10-CM | POA: Diagnosis not present

## 2024-02-27 DIAGNOSIS — T466X5A Adverse effect of antihyperlipidemic and antiarteriosclerotic drugs, initial encounter: Secondary | ICD-10-CM | POA: Diagnosis not present

## 2024-02-27 DIAGNOSIS — E7800 Pure hypercholesterolemia, unspecified: Secondary | ICD-10-CM | POA: Diagnosis not present

## 2024-02-27 DIAGNOSIS — M810 Age-related osteoporosis without current pathological fracture: Secondary | ICD-10-CM | POA: Diagnosis not present

## 2024-02-27 DIAGNOSIS — Z0000 Encounter for general adult medical examination without abnormal findings: Secondary | ICD-10-CM | POA: Diagnosis not present

## 2024-02-27 DIAGNOSIS — L237 Allergic contact dermatitis due to plants, except food: Secondary | ICD-10-CM | POA: Diagnosis not present

## 2024-02-27 DIAGNOSIS — I10 Essential (primary) hypertension: Secondary | ICD-10-CM | POA: Diagnosis not present

## 2024-02-27 DIAGNOSIS — R296 Repeated falls: Secondary | ICD-10-CM | POA: Diagnosis not present

## 2024-03-06 ENCOUNTER — Encounter: Payer: Self-pay | Admitting: Cardiology

## 2024-05-08 ENCOUNTER — Ambulatory Visit: Payer: Medicare PPO | Attending: Cardiology | Admitting: Cardiology

## 2024-05-08 VITALS — BP 164/80 | HR 82 | Ht 63.0 in | Wt 139.6 lb

## 2024-05-08 DIAGNOSIS — I10 Essential (primary) hypertension: Secondary | ICD-10-CM

## 2024-05-08 DIAGNOSIS — I2510 Atherosclerotic heart disease of native coronary artery without angina pectoris: Secondary | ICD-10-CM

## 2024-05-08 DIAGNOSIS — Z79899 Other long term (current) drug therapy: Secondary | ICD-10-CM

## 2024-05-08 DIAGNOSIS — E782 Mixed hyperlipidemia: Secondary | ICD-10-CM | POA: Diagnosis not present

## 2024-05-08 DIAGNOSIS — R079 Chest pain, unspecified: Secondary | ICD-10-CM | POA: Diagnosis not present

## 2024-05-08 NOTE — Patient Instructions (Addendum)
 Medication Instructions:  Your physician recommends that you continue on your current medications as directed. Please refer to the Current Medication list given to you today.  *If you need a refill on your cardiac medications before your next appointment, please call your pharmacy*  Lab Work: Please complete a FASTING lipid panel and ALT today in our first floor lab before you leave.  If you have labs (blood work) drawn today and your tests are completely normal, you will receive your results only by: MyChart Message (if you have MyChart) OR A paper copy in the mail If you have any lab test that is abnormal or we need to change your treatment, we will call you to review the results.  Testing/Procedures: Your physician has requested that you have a lexiscan myoview. For further information please visit [email or web address] Please follow instruction sheet, as given.  Dr. Micael Adas has also ordered a 24 hour blood pressure monitor for you. There is currently a wait list for this device. Someone from our office will call you when it is available.   You will be scheduled for a Myocardial Perfusion Imaging Study.  Please arrive 15 minutes prior to your appointment time for registration and insurance purposes.  The test will take approximately 3 to 4 hours to complete; you may bring reading material.  If someone comes with you to your appointment, they will need to remain in the main lobby due to limited space in the testing area. **If you are pregnant or breastfeeding, please notify the nuclear lab prior to your appointment**  How to prepare for your Myocardial Perfusion Test: Do not eat or drink 3 hours prior to your test, except you may have water. Do not consume products containing caffeine (regular or decaffeinated) 12 hours prior to your test. (ex: coffee, chocolate, sodas, tea). Do bring a list of your current medications with you.  If not listed below, you may take your medications as  normal. Do wear comfortable clothes (no dresses or overalls) and walking shoes, tennis shoes preferred (No heels or open toe shoes are allowed). Do NOT wear cologne, perfume, aftershave, or lotions (deodorant is allowed). If these instructions are not followed, your test will have to be rescheduled.  Please report to [address] ([facility]), 2nd Floor, for your test.  If you have questions or concerns about your appointment, you can call the Nuclear Lab at [phone].  If you cannot keep your appointment, please provide 24 hours notification to the Nuclear Lab, to avoid a possible $50 charge to your account.  Follow-Up: At [facility], you and your health needs are our priority.  As part of our continuing mission to provide you with exceptional heart care, our providers are all part of one team.  This team includes your primary Cardiologist (physician) and Advanced Practice Providers or APPs (Physician Assistants and Nurse Practitioners) who all work together to provide you with the care you need, when you need it.  Your next appointment:   1 year(s)  Provider:   Dr. Gaylyn Keas, MD

## 2024-05-08 NOTE — Progress Notes (Signed)
 Cardiology Office Note:    Date:  05/08/2024   ID:  Samantha Conley, DOB 1951/11/29, MRN [id number]  PCP:  Dorena Gander, MD  Cardiologist:  Mickiel Albany, MD (Inactive)   Referring MD: Dorena Gander, MD   Chief Complaint  Patient presents with   Coronary Artery Disease   Hypertension   Hyperlipidemia    History of Present Illness:    Samantha Conley is a 73 y.o. female with a family history of vascular disease, and personal history of hyperlipidemia (untreated), prior negative ischemic workup with stress echo 2017, diastolic left ventricular dysfunction, and CKD 3.  Mildly abnormal coronary calcium  score 2019, 24 with no more than mild plaque in the proximal LAD.  She is here today for followup and is doing well.  She denies any exertional chest pain or pressure, SOB, DOE, PND, orthopnea, LE edema, dizziness, palpitations or syncope. She is compliant with her meds and is tolerating meds with no SE.    Past Medical History:  Diagnosis Date   Agatston coronary artery calcium  score less than 100    24 with mild plaque in the proximal LAD   Allergy    Anemia    Anxiety    Arthritis    back   Bipolar disorder (HCC)    Bipolar disorder, unspecified (HCC) 04/19/2014   Cataract    chemical imbalance 12/14/2011   Chronic kidney disease (CKD), stage III (moderate) (HCC)    Dehydration 04/25/2014   Dehydration, severe 03/12/2014   Depression    Diarrhea 03/12/2014   Essential hypertension    GERD (gastroesophageal reflux disease)    Gout    HLD (hyperlipidemia) 04/27/2017   Hypercalcemia 03/12/2014   Insomnia    Lithium  toxicity    Osteoarthritis - DJD cervical, thoracic, lumbar 02/24/2020   Osteopenia 05/06/2019   DEXA: 05/2019: lowest T = -2.3 at left femur. Recheck 2 years.     Past Surgical History:  Procedure Laterality Date   CATARACT EXTRACTION, BILATERAL     Lens implant bilaterally   CHOLECYSTECTOMY     COLONOSCOPY N/A 01/14/2018   NASAL SINUS SURGERY   2014   TONSILLECTOMY     TOTAL ABDOMINAL HYSTERECTOMY     endometriosis, bleeding    Current Medications: Current Meds  Medication Sig   acetaminophen  (TYLENOL ) 500 MG tablet Take 500 mg by mouth every 6 (six) hours as needed for mild pain.   citalopram  (CELEXA ) 10 MG tablet Take 5 mg by mouth daily.   divalproex  (DEPAKOTE  ER) 250 MG 24 hr tablet Take 250 mg by mouth 2 (two) times daily.   doxepin  (SINEQUAN ) 50 MG capsule Take 50 mg by mouth at bedtime.   polyethylene glycol (MIRALAX  / GLYCOLAX ) packet Take 17 g by mouth daily as needed for mild constipation.   rosuvastatin  (CRESTOR ) 5 MG tablet Take 5 mg by mouth daily.     Allergies:   Lidocaine, Benadryl  [diphenhydramine  hcl], Codeine, Cortisone, Doxycycline, Morphine and codeine, Promethazine, and Rosuvastatin    Social History   Socioeconomic History   Marital status: Single    Spouse name: Not on file   Number of children: 0   Years of education: 20   Highest education level: Not on file  Occupational History   Occupation: retired    Associate Professor: [employer]    Comment: librarian  Tobacco Use   Smoking status: Never   Smokeless tobacco: Never  Vaping Use   Vaping status: Never Used  Substance and Sexual  Activity   Alcohol  use: No    Alcohol /week: 0.0 standard drinks of alcohol    Drug use: No   Sexual activity: Not Currently  Other Topics Concern   Not on file  Social History Narrative   Lives alone with two pets,   Never married   Two masters degrees   Worked for the state      Social Drivers of Health   Financial Resource Strain: Low Risk  (05/16/2021)   Overall Financial Resource Strain (CARDIA)    Difficulty of Paying Living Expenses: Not hard at all  Food Insecurity: No Food Insecurity (05/16/2021)   Hunger Vital Sign    Worried About Running Out of Food in the Last Year: Never true    Ran Out of Food in the Last Year: Never true  Transportation Needs: No Transportation Needs (05/16/2021)   PRAPARE -  Administrator, Civil Service (Medical): No    Lack of Transportation (Non-Medical): No  Physical Activity: Sufficiently Active (05/16/2021)   Exercise Vital Sign    Days of Exercise per Week: 7 days    Minutes of Exercise per Session: 40 min  Stress: No Stress Concern Present (05/16/2021)   [employer] of Occupational Health - Occupational Stress Questionnaire    Feeling of Stress : Only a little  Social Connections: Unknown (10/18/2022)   Received from [facility], [facility]   Social Network    Social Network: Not on file     Family History: The patient's family history includes CAD in her father and mother; CVA in her mother; Cancer in her brother, brother, father, and paternal grandmother; Heart disease in her father, maternal grandfather, mother, and paternal grandfather; Hyperlipidemia in her mother; Hypertension in her mother; Parkinsonism in her father; Stroke in her maternal grandmother and mother.  ROS:   Please see the history of present illness.    No all other systems reviewed and are negative.  EKGs/Labs/Other Studies Reviewed:    The following studies were reviewed today: Coronary calcium  score 2019: IMPRESSION: 1. Coronary artery calcium  score 24 Agatston units, placing the patient in the 66th percentile for age and gender. This suggests intermediate risk for future cardiac events.   2. Plaque throughout the proximal LAD. Probably no more than mild stenosis but will send for FFR to confirm.  EKG Interpretation Date/Time:  Thursday May 08 2024 11:14:58 EDT Ventricular Rate:  70 PR Interval:  180 QRS Duration:  64 QT Interval:  390 QTC Calculation: 421 R Axis:   -12  Text Interpretation: Sinus rhythm with occasional Premature ventricular complexes Low voltage QRS RSR' or QR pattern in V1 suggests right ventricular conduction delay When compared with ECG of 24-May-2023 13:47, Premature ventricular complexes are now Present PR interval has  decreased Confirmed by Gaylyn Keas [phone]) on 05/08/2024 11:16:17 AM    Recent Labs: 08/16/2023: Hemoglobin 12.7; Platelets 155  Recent Lipid Panel    Component Value Date/Time   CHOL 126 03/05/2023 1047   TRIG 93 03/05/2023 1047   HDL 53 03/05/2023 1047   CHOLHDL 2.4 03/05/2023 1047   CHOLHDL 3 02/21/2021 0920   VLDL 23.0 02/21/2021 0920   LDLCALC 55 03/05/2023 1047    EKG Interpretation Date/Time:  Thursday May 08 2024 11:14:58 EDT Ventricular Rate:  70 PR Interval:  180 QRS Duration:  64 QT Interval:  390 QTC Calculation: 421 R Axis:   -12  Text Interpretation: Sinus rhythm with occasional Premature ventricular complexes Low voltage QRS RSR' or QR  pattern in V1 suggests right ventricular conduction delay When compared with ECG of 24-May-2023 13:47, Premature ventricular complexes are now Present PR interval has decreased Confirmed by Gaylyn Keas ([id number]) on 05/08/2024 11:16:17 AM   Physical Exam:    VS:  BP (!) 164/80 (BP Location: Right Arm, Patient Position: Sitting, Cuff Size: Normal)   Pulse 82   Ht 5\' 3"  (1.6 m)   Wt 139 lb 9.6 oz (63.3 kg)   SpO2 98%   BMI 24.73 kg/m     Wt Readings from Last 3 Encounters:  05/08/24 139 lb 9.6 oz (63.3 kg)  08/16/23 133 lb (60.3 kg)  05/24/23 136 lb 3.2 oz (61.8 kg)    GEN: Well nourished, well developed in no acute distress HEENT: Normal NECK: No JVD; No carotid bruits LYMPHATICS: No lymphadenopathy CARDIAC:RRR, no murmurs, rubs, gallops RESPIRATORY:  Clear to auscultation without rales, wheezing or rhonchi  ABDOMEN: Soft, non-tender, non-distended MUSCULOSKELETAL:  No edema; No deformity  SKIN: Warm and dry NEUROLOGIC:  Alert and oriented x 3 PSYCHIATRIC:  Normal affect  ASSESSMENT:    1. Coronary artery calcification seen on CT scan   2. Essential hypertension   3. Mixed hyperlipidemia      PLAN:    In order of problems listed above:  Coronary artery calcifications Chest Pain -coronary Ca score 24 with  mild plaque in prox LAD by coronary CTA in 2019 -She has been having atypical CP on the right side that is exertional and nonexertional that occurs a few times weekly lasting a few seconds with no radiation and no associated symptoms -Continue Crestor  10 mg daily with as needed refills -See has refused aspirin  therapy -I am going to get a Lexiscan myoview to rule out ischemia -Informed Consent   Shared Decision Making/Informed Consent The risks [chest pain, shortness of breath, cardiac arrhythmias, dizziness, blood pressure fluctuations, myocardial infarction, stroke/transient ischemic attack, nausea, vomiting, allergic reaction, radiation exposure, metallic taste sensation and life-threatening complications (estimated to be 1 in 10,000)], benefits (risk stratification, diagnosing coronary artery disease, treatment guidance) and alternatives of a nuclear stress test were discussed in detail with Samantha Conley and she agrees to proceed.    Hypertension -BP elevated today  -will get a 24 hour BP monitor  Hyperlipidemia -LDL goal less than 70 due to coronary calcifications -I have personally reviewed and interpreted outside labs performed by patient's PCP which showed LDL 57, HDL 57, triglycerides 71, ALT 22 on 02/27/2024 -Her PCP decreased her Crestor  5mg  daily due to joint pain -will repeat FLP and ALT  Medication Adjustments/Labs and Tests Ordered: Current medicines are reviewed at length with the patient today.  Concerns regarding medicines are outlined above.  Orders Placed This Encounter  Procedures   EKG 12-Lead   No orders of the defined types were placed in this encounter.   There are no Patient Instructions on file for this visit.   Followup with me in 1 year  Signed, Gaylyn Keas, MD  05/08/2024 11:16 AM    [facility]

## 2024-05-08 NOTE — Addendum Note (Signed)
 Addended by: Cherylyn Cos on: 05/08/2024 11:35 AM   Modules accepted: Orders

## 2024-05-09 ENCOUNTER — Telehealth (HOSPITAL_COMMUNITY): Payer: Self-pay | Admitting: *Deleted

## 2024-05-09 ENCOUNTER — Ambulatory Visit: Payer: Self-pay | Admitting: *Deleted

## 2024-05-09 ENCOUNTER — Encounter (HOSPITAL_COMMUNITY): Payer: Self-pay | Admitting: *Deleted

## 2024-05-09 LAB — LIPID PANEL
Chol/HDL Ratio: 2.4 ratio (ref 0.0–4.4)
Cholesterol, Total: 170 mg/dL (ref 100–199)
HDL: 72 mg/dL (ref >39)
LDL Chol Calc (NIH): 81 mg/dL (ref 0–99)
Triglycerides: 95 mg/dL (ref 0–149)
VLDL Cholesterol Cal: 17 mg/dL (ref 5–40)

## 2024-05-09 LAB — ALT: ALT: 21 IU/L (ref 0–32)

## 2024-05-09 NOTE — Telephone Encounter (Signed)
 Reminder letter with instructions sent [employer] for upcoming stress test on 05/19/24 at 7:45

## 2024-05-13 ENCOUNTER — Other Ambulatory Visit: Payer: Self-pay | Admitting: Cardiology

## 2024-05-13 ENCOUNTER — Telehealth: Payer: Self-pay

## 2024-05-13 DIAGNOSIS — R079 Chest pain, unspecified: Secondary | ICD-10-CM

## 2024-05-13 NOTE — Telephone Encounter (Signed)
Attestation signed

## 2024-05-13 NOTE — Telephone Encounter (Signed)
Pt follow up response

## 2024-05-15 ENCOUNTER — Other Ambulatory Visit: Payer: Self-pay | Admitting: *Deleted

## 2024-05-15 DIAGNOSIS — I10 Essential (primary) hypertension: Secondary | ICD-10-CM

## 2024-05-15 DIAGNOSIS — Z79899 Other long term (current) drug therapy: Secondary | ICD-10-CM

## 2024-05-15 DIAGNOSIS — E785 Hyperlipidemia, unspecified: Secondary | ICD-10-CM

## 2024-05-15 NOTE — Telephone Encounter (Signed)
Routing back to triage.

## 2024-05-15 NOTE — Progress Notes (Signed)
 Placed order for PharmD per Dr. Micael Adas. Sent patient message to make her aware.Samantha Conley

## 2024-05-19 ENCOUNTER — Ambulatory Visit: Payer: Medicare PPO

## 2024-05-19 ENCOUNTER — Ambulatory Visit (HOSPITAL_COMMUNITY)
Admission: RE | Admit: 2024-05-19 | Discharge: 2024-05-19 | Disposition: A | Discharge status: Home / Self Care | Source: Ambulatory Visit | Attending: Cardiology | Admitting: Cardiology

## 2024-05-19 DIAGNOSIS — R079 Chest pain, unspecified: Secondary | ICD-10-CM | POA: Insufficient documentation

## 2024-05-19 LAB — MYOCARDIAL PERFUSION IMAGING
LV dias vol: 58 mL (ref 46–106)
LV sys vol: 13 mL (ref 3.8–5.2)
Nuc Stress EF: 78 %
Peak HR: 103 {beats}/min
Rest HR: 69 {beats}/min
Rest Nuclear Isotope Dose: 10.8 mCi
SDS: 0
SRS: 2
SSS: 2
ST Depression (mm): 0 mm
Stress Nuclear Isotope Dose: 32.7 mCi
TID: 1.16

## 2024-05-19 MED ORDER — TECHNETIUM TC 99M TETROFOSMIN IV KIT: 32.7000 | PACK | Freq: Once | INTRAVENOUS | Status: AC | PRN

## 2024-05-19 MED ORDER — REGADENOSON 0.4 MG/5ML IV SOLN
INTRAVENOUS | Status: AC
  Filled 2024-05-19: qty 5

## 2024-05-19 MED ORDER — REGADENOSON 0.4 MG/5ML IV SOLN: 0.4000 mg | Freq: Once | INTRAVENOUS | Status: AC

## 2024-05-19 MED ORDER — TECHNETIUM TC 99M TETROFOSMIN IV KIT: 10.8000 | PACK | Freq: Once | INTRAVENOUS | Status: AC | PRN

## 2024-05-20 ENCOUNTER — Ambulatory Visit
Admission: RE | Admit: 2024-05-20 | Discharge: 2024-05-20 | Disposition: A | Discharge status: Home / Self Care | Payer: Medicare PPO | Source: Ambulatory Visit | Attending: Adult Health | Admitting: Adult Health

## 2024-05-20 DIAGNOSIS — Z1231 Encounter for screening mammogram for malignant neoplasm of breast: Secondary | ICD-10-CM

## 2024-05-20 DIAGNOSIS — F411 Generalized anxiety disorder: Secondary | ICD-10-CM | POA: Diagnosis not present

## 2024-05-20 DIAGNOSIS — F310 Bipolar disorder, current episode hypomanic: Secondary | ICD-10-CM | POA: Diagnosis not present

## 2024-05-28 ENCOUNTER — Telehealth: Payer: Self-pay | Admitting: Cardiology

## 2024-05-28 NOTE — Telephone Encounter (Signed)
  Pt is calling to get stress test result. She said,she doesn't have access to her mychart but she would like to know her result and also get a paper copy sent to her address

## 2024-05-28 NOTE — Telephone Encounter (Signed)
 The patient has been notified of the result and verbalized understanding.  All questions (if any) were answered. Copy of results mailed to pt. Samantha Conley [place], [place] 05/28/2024 5:23 PM

## 2024-05-29 NOTE — Telephone Encounter (Signed)
 Call to patient to advise that stress test is normal. No answer, no updated DPR on file. Left message with no identifiers asking patient to call [place] at our office #.

## 2024-05-29 NOTE — Telephone Encounter (Signed)
 Call to patient to know that stress test was fine, patient verbalizes understanding.

## 2024-05-29 NOTE — Telephone Encounter (Signed)
-----   Message from Wilbert Bihari sent at 05/19/2024 11:15 PM EDT ----- Please let patient know that stress test was fine ----- Message ----- From: Mona Vinie BROCKS, MD Sent: 05/19/2024   2:32 PM EDT To: Wilbert JONELLE Bihari, MD

## 2024-06-02 ENCOUNTER — Encounter: Payer: Self-pay | Admitting: Cardiology

## 2024-06-02 ENCOUNTER — Ambulatory Visit: Payer: Self-pay | Admitting: Cardiology

## 2024-06-19 ENCOUNTER — Other Ambulatory Visit: Payer: Self-pay | Admitting: Cardiology

## 2024-06-19 ENCOUNTER — Telehealth: Payer: Self-pay | Admitting: Cardiology

## 2024-06-19 DIAGNOSIS — E782 Mixed hyperlipidemia: Secondary | ICD-10-CM

## 2024-06-19 DIAGNOSIS — R079 Chest pain, unspecified: Secondary | ICD-10-CM

## 2024-06-19 DIAGNOSIS — I2510 Atherosclerotic heart disease of native coronary artery without angina pectoris: Secondary | ICD-10-CM

## 2024-06-19 DIAGNOSIS — R030 Elevated blood-pressure reading, without diagnosis of hypertension: Secondary | ICD-10-CM

## 2024-06-19 DIAGNOSIS — I10 Essential (primary) hypertension: Secondary | ICD-10-CM

## 2024-06-19 DIAGNOSIS — Z79899 Other long term (current) drug therapy: Secondary | ICD-10-CM

## 2024-06-19 NOTE — Telephone Encounter (Signed)
 Hey, can you all look at her 24 hour blood pressure monitoring order. Pt states that she has not received anything.

## 2024-06-19 NOTE — Telephone Encounter (Signed)
 Patient called to follow-up on getting BP equipment.

## 2024-06-19 NOTE — Telephone Encounter (Signed)
 Patient scheduled to come in Monday, 06/23/2024, to have a 24 hour ambulatory blood pressure monitor applied.

## 2024-06-23 ENCOUNTER — Ambulatory Visit: Attending: Internal Medicine

## 2024-06-23 DIAGNOSIS — I10 Essential (primary) hypertension: Secondary | ICD-10-CM

## 2024-06-23 DIAGNOSIS — R030 Elevated blood-pressure reading, without diagnosis of hypertension: Secondary | ICD-10-CM

## 2024-06-23 DIAGNOSIS — Z79899 Other long term (current) drug therapy: Secondary | ICD-10-CM

## 2024-06-23 NOTE — Progress Notes (Unsigned)
 24 hour ambulatory blood pressure monitor applied to patients left arm using standard adult cuff.  Dr. Shlomo to read.

## 2024-06-24 ENCOUNTER — Ambulatory Visit: Payer: Self-pay | Admitting: Cardiology

## 2024-06-24 DIAGNOSIS — R030 Elevated blood-pressure reading, without diagnosis of hypertension: Secondary | ICD-10-CM | POA: Diagnosis not present

## 2024-06-24 DIAGNOSIS — Z79899 Other long term (current) drug therapy: Secondary | ICD-10-CM | POA: Diagnosis not present

## 2024-06-24 DIAGNOSIS — I10 Essential (primary) hypertension: Secondary | ICD-10-CM

## 2024-06-25 NOTE — Telephone Encounter (Signed)
 Spoke with pt, aware of results and recommendations. Results forwarded to PCP at patients request.

## 2024-06-25 NOTE — Telephone Encounter (Signed)
 Patient following up regarding BP readings. Would like a call back.

## 2024-06-26 NOTE — Telephone Encounter (Signed)
-----   Message from Samantha Conley sent at 06/02/2024 10:14 AM EDT ----- Noncardiac portion of stress test showed atherosclerosis of the aorta ----- Message ----- From: Interface, Rad Results In Sent: 05/30/2024   9:13 AM EDT To: Samantha JONELLE Bihari, MD

## 2024-06-26 NOTE — Telephone Encounter (Signed)
 Patient advised of normal blood pressure readings on 24 hour blood pressure monitor, verbalizes understanding to continue current medications.

## 2024-06-26 NOTE — Telephone Encounter (Signed)
 Call to patient to discuss stress test results, no answer, LVM per DPR asking patient to call our office.

## 2024-06-26 NOTE — Telephone Encounter (Signed)
 Call to patient to discuss Noncardiac portion of stress test which showed atherosclerosis of the aorta. Patient verbalizes understanding.

## 2024-06-26 NOTE — Telephone Encounter (Signed)
-----   Message from Wilbert Bihari sent at 06/24/2024  4:54 PM EDT ----- Normal blood pressure readings continue current medications ----- Message ----- From: Bihari Wilbert SAUNDERS, MD Sent: 06/24/2024   4:51 PM EDT To: Wilbert SAUNDERS Bihari, MD

## 2024-10-08 ENCOUNTER — Encounter (INDEPENDENT_AMBULATORY_CARE_PROVIDER_SITE_OTHER): Payer: Self-pay | Admitting: Gastroenterology

## 2024-10-14 ENCOUNTER — Other Ambulatory Visit: Payer: Self-pay | Admitting: Family Medicine

## 2024-10-14 DIAGNOSIS — Z1231 Encounter for screening mammogram for malignant neoplasm of breast: Secondary | ICD-10-CM

## 2025-05-29 ENCOUNTER — Ambulatory Visit
